# Patient Record
Sex: Male | Born: 1965 | Race: Black or African American | Hispanic: No | Marital: Single | Smoking: Former smoker
Health system: Southern US, Community
[De-identification: ages and names within clinical notes are randomized; demographics above are authoritative.]

## PROBLEM LIST (undated history)

## (undated) DIAGNOSIS — F329 Major depressive disorder, single episode, unspecified: Secondary | ICD-10-CM

## (undated) DIAGNOSIS — F32A Depression, unspecified: Secondary | ICD-10-CM

## (undated) DIAGNOSIS — Z950 Presence of cardiac pacemaker: Secondary | ICD-10-CM

## (undated) DIAGNOSIS — B20 Human immunodeficiency virus [HIV] disease: Secondary | ICD-10-CM

## (undated) DIAGNOSIS — Z21 Asymptomatic human immunodeficiency virus [HIV] infection status: Secondary | ICD-10-CM

## (undated) DIAGNOSIS — I10 Essential (primary) hypertension: Secondary | ICD-10-CM

## (undated) HISTORY — PX: PACEMAKER PLACEMENT: SHX43

## (undated) HISTORY — DX: Depression, unspecified: F32.A

---
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
## 2019-05-02 ENCOUNTER — Emergency Department (HOSPITAL_COMMUNITY)
Admission: EM | Admit: 2019-05-02 | Discharge: 2019-05-02 | Disposition: A | Discharge status: Home / Self Care | Payer: Medicaid Other | Attending: Emergency Medicine | Admitting: Emergency Medicine

## 2019-05-02 ENCOUNTER — Encounter (HOSPITAL_COMMUNITY): Payer: Self-pay | Admitting: Emergency Medicine

## 2019-05-02 ENCOUNTER — Other Ambulatory Visit: Payer: Self-pay

## 2019-05-02 DIAGNOSIS — Z760 Encounter for issue of repeat prescription: Secondary | ICD-10-CM | POA: Diagnosis present

## 2019-05-02 DIAGNOSIS — Z21 Asymptomatic human immunodeficiency virus [HIV] infection status: Secondary | ICD-10-CM | POA: Insufficient documentation

## 2019-05-02 DIAGNOSIS — Z950 Presence of cardiac pacemaker: Secondary | ICD-10-CM | POA: Diagnosis not present

## 2019-05-02 DIAGNOSIS — Z79899 Other long term (current) drug therapy: Secondary | ICD-10-CM | POA: Diagnosis not present

## 2019-05-02 DIAGNOSIS — I10 Essential (primary) hypertension: Secondary | ICD-10-CM | POA: Diagnosis not present

## 2019-05-02 HISTORY — DX: Essential (primary) hypertension: I10

## 2019-05-02 HISTORY — DX: Presence of cardiac pacemaker: Z95.0

## 2019-05-02 HISTORY — DX: Asymptomatic human immunodeficiency virus (hiv) infection status: Z21

## 2019-05-02 HISTORY — DX: Human immunodeficiency virus (HIV) disease: B20

## 2019-05-02 MED ORDER — AZITHROMYCIN 600 MG PO TABS: 600.0000 mg | ORAL_TABLET | ORAL | 0 refills | Status: DC

## 2019-05-02 MED ORDER — SULFAMETHOXAZOLE-TRIMETHOPRIM 800-160 MG PO TABS: 1.0000 | ORAL_TABLET | Freq: Every evening | ORAL | 0 refills | Status: DC

## 2019-05-02 MED ORDER — FOLIC ACID 1 MG PO TABS: 1.0000 mg | ORAL_TABLET | Freq: Every evening | ORAL | 0 refills | Status: DC

## 2019-05-02 MED ORDER — VITAMIN D 25 MCG (1000 UNIT) PO TABS: 1000.0000 [IU] | ORAL_TABLET | Freq: Every day | ORAL | 0 refills | Status: DC

## 2019-05-02 MED ORDER — VALACYCLOVIR HCL 500 MG PO TABS: 500.0000 mg | ORAL_TABLET | Freq: Every evening | ORAL | 0 refills | Status: AC

## 2019-05-02 MED ORDER — GABAPENTIN 600 MG PO TABS: 600.0000 mg | ORAL_TABLET | Freq: Every evening | ORAL | 0 refills | Status: DC

## 2019-05-02 MED ORDER — RISPERIDONE 2 MG PO TABS: 2.0000 mg | ORAL_TABLET | Freq: Every day | ORAL | 0 refills | Status: DC

## 2019-05-02 MED ORDER — AMLODIPINE BESYLATE 10 MG PO TABS: 10.0000 mg | ORAL_TABLET | Freq: Every day | ORAL | 0 refills | Status: DC

## 2019-05-02 MED ORDER — ASPIRIN EC 81 MG PO TBEC: 81.0000 mg | DELAYED_RELEASE_TABLET | Freq: Every evening | ORAL | 0 refills | Status: DC

## 2019-05-02 MED ORDER — VITAMIN D (ERGOCALCIFEROL) 1.25 MG (50000 UNIT) PO CAPS: 50000.0000 [IU] | ORAL_CAPSULE | ORAL | 0 refills | Status: AC

## 2019-05-02 MED ORDER — GENVOYA 150-150-200-10 MG PO TABS: 1.0000 | ORAL_TABLET | Freq: Every evening | ORAL | 0 refills | Status: DC

## 2019-05-02 MED ORDER — METOPROLOL SUCCINATE ER 25 MG PO TB24: 25.0000 mg | ORAL_TABLET | Freq: Every evening | ORAL | 0 refills | Status: DC

## 2019-05-02 MED FILL — GABAPENTIN 600 MG TABLET: 600 | 30 days supply | Qty: 30 | Fill #0

## 2019-05-02 MED FILL — risperiDONE 1 MG TABS: 1 | 30 days supply | Qty: 60 | Fill #0

## 2019-05-02 MED FILL — VIT D2 1.25 MG (50,000 UNIT: 1.25 MG | 5 days supply | Qty: 5 | Fill #0

## 2019-05-02 MED FILL — VITAMIN D3 1,000 UNIT TAB: 25 MCG | 30 days supply | Qty: 30 | Fill #0

## 2019-05-02 MED FILL — METOPROLOL SUCCINATE ER 25: 25 | 30 days supply | Qty: 30 | Fill #0

## 2019-05-02 MED FILL — AMLODIPINE BESYLATE 10 MG T: 10 | 30 days supply | Qty: 30 | Fill #0

## 2019-05-02 MED FILL — VALACYCLOVIR HCL 500 MG TAB: 500 | 30 days supply | Qty: 30 | Fill #0

## 2019-05-02 MED FILL — SULFAMETHOXAZOLE-TMP DS TAB: 800-160 | 30 days supply | Qty: 30 | Fill #0

## 2019-05-02 MED FILL — GENVOYA TABLET: 150-150-200 | 30 days supply | Qty: 30 | Fill #0

## 2019-05-02 MED FILL — FOLIC ACID 1 MG TABS: 1 | 30 days supply | Qty: 30 | Fill #0

## 2019-05-02 MED FILL — AZITHROMYCIN 600 MG TABLET: 600 | 30 days supply | Qty: 5 | Fill #0

## 2019-05-02 MED FILL — ASPIRIN LOW DOSE 81 MG TBEC: 81 | 30 days supply | Qty: 30 | Fill #0

## 2019-05-02 NOTE — Discharge Planning (Signed)
EDCM delivered medications to pt bedside.

## 2019-05-02 NOTE — ED Notes (Signed)
Medications given to patient.  

## 2019-05-02 NOTE — ED Provider Notes (Signed)
[facility] EMERGENCY DEPARTMENT Provider Note   CSN: [id number] Arrival date & time: 05/02/19  [id number]     History   Chief Complaint Chief Complaint  Patient presents with  . Medication Refill    HPI Mike Holland is a 53 y.o. male.     Pt presents to the ED today in need of medications and a pcp.  The pt has a hx of HIV and HTN.  He has recently moved from [place] and is almost out of his meds.  He has about 1 week left.  The pt said he feels fine, just does not want to run out of his meds.  He said he has no insurance and has applied for [employer], it just has not come through yet.     Past Medical History:  Diagnosis Date  . HIV (human immunodeficiency virus infection) ([place])   . Hypertension   . Pacemaker     There are no active problems to display for this patient.   Past Surgical History:  Procedure Laterality Date  . PACEMAKER PLACEMENT          Home Medications    Prior to Admission medications   Medication Sig Start Date End Date Taking? Authorizing Provider  amLODipine (NORVASC) 10 MG tablet Take 1 tablet (10 mg total) by mouth daily. 05/02/19   Isla Pence, MD  aspirin EC 81 MG tablet Take 1 tablet (81 mg total) by mouth every evening. 05/02/19   Isla Pence, MD  azithromycin (ZITHROMAX) 600 MG tablet Take 1 tablet (600 mg total) by mouth every Wednesday. 05/04/19   Isla Pence, MD  cholecalciferol (VITAMIN D3) 25 MCG (1000 UT) tablet Take 1 tablet (1,000 Units total) by mouth daily. 05/02/19   Isla Pence, MD  elvitegravir-cobicistat-emtricitabine-tenofovir (GENVOYA) 150-150-200-10 MG TABS tablet Take 1 tablet by mouth every evening. 05/02/19   Isla Pence, MD  folic acid (FOLVITE) 1 MG tablet Take 1 tablet (1 mg total) by mouth every evening. 05/02/19   Isla Pence, MD  gabapentin (NEURONTIN) 600 MG tablet Take 1 tablet (600 mg total) by mouth every evening. 05/02/19   Isla Pence, MD  metoprolol succinate  (TOPROL-XL) 25 MG 24 hr tablet Take 1 tablet (25 mg total) by mouth every evening. 05/02/19   Isla Pence, MD  risperiDONE (RISPERDAL) 2 MG tablet Take 1 tablet (2 mg total) by mouth at bedtime. 05/02/19   Isla Pence, MD  sulfamethoxazole-trimethoprim (BACTRIM DS) 800-160 MG tablet Take 1 tablet by mouth every evening. 05/02/19   Isla Pence, MD  valACYclovir (VALTREX) 500 MG tablet Take 1 tablet (500 mg total) by mouth every evening. 05/02/19   Isla Pence, MD  Vitamin D, Ergocalciferol, (DRISDOL) 1.25 MG (50000 UT) CAPS capsule Take 1 capsule (50,000 Units total) by mouth every Wednesday. 05/04/19   Isla Pence, MD    Family History No family history on file.  Social History Social History   Tobacco Use  . Smoking status: Not on file  Substance Use Topics  . Alcohol use: Not on file  . Drug use: Not on file     Allergies   Patient has no known allergies.   Review of Systems Review of Systems  All other systems reviewed and are negative.    Physical Exam Updated Vital Signs BP 122/87   Pulse 69   Temp 98.4 F (36.9 C) (Oral)   Resp 12   SpO2 98%   Physical Exam Vitals signs and nursing note reviewed.  Constitutional:      Appearance: Normal appearance.  HENT:     Head: Normocephalic and atraumatic.     Right Ear: External ear normal.     Left Ear: External ear normal.     Nose: Nose normal.     Mouth/Throat:     Mouth: Mucous membranes are moist.  Eyes:     Extraocular Movements: Extraocular movements intact.     Conjunctiva/sclera: Conjunctivae normal.     Pupils: Pupils are equal, round, and reactive to light.  Neck:     Musculoskeletal: Normal range of motion and neck supple.  Cardiovascular:     Rate and Rhythm: Normal rate and regular rhythm.     Pulses: Normal pulses.     Heart sounds: Normal heart sounds.  Pulmonary:     Breath sounds: Wheezing present.  Abdominal:     General: Abdomen is flat. Bowel sounds are normal.      Palpations: Abdomen is soft.  Musculoskeletal:     Comments: Clubbing of fingers  Skin:    General: Skin is warm and dry.     Capillary Refill: Capillary refill takes less than 2 seconds.  Neurological:     General: No focal deficit present.     Mental Status: He is alert and oriented to person, place, and time.  Psychiatric:        Mood and Affect: Mood normal.        Behavior: Behavior normal.        Thought Content: Thought content normal.        Judgment: Judgment normal.      ED Treatments / Results  Labs (all labs ordered are listed, but only abnormal results are displayed) Labs Reviewed - No data to display  EKG None  Radiology No results found.  Procedures Procedures (including critical care time)  Medications Ordered in ED Medications - No data to display   Initial Impression / Assessment and Plan / ED Course  I have reviewed the triage vital signs and the nursing notes.  Pertinent labs & imaging results that were available during my care of the patient were reviewed by me and considered in my medical decision making (see chart for details).        Pt d/w SW who will help pt with meds and to get in with a pcp.  I gave him a month's supply of all his meds.  Final Clinical Impressions(s) / ED Diagnoses   Final diagnoses:  Medication refill    ED Discharge Orders         Ordered    azithromycin (ZITHROMAX) 600 MG tablet  Every Wed     05/02/19 1122    Vitamin D, Ergocalciferol, (DRISDOL) 1.25 MG (50000 UT) CAPS capsule  Every Wed     05/02/19 1122    amLODipine (NORVASC) 10 MG tablet  Daily     05/02/19 1122    aspirin EC 81 MG tablet  Every evening     05/02/19 1122    cholecalciferol (VITAMIN D3) 25 MCG (1000 UT) tablet  Daily     05/02/19 1122    elvitegravir-cobicistat-emtricitabine-tenofovir (GENVOYA) 150-150-200-10 MG TABS tablet  Every evening     [id number]    folic acid (FOLVITE) 1 MG tablet  Every evening     05/02/19 1122     gabapentin (NEURONTIN) 600 MG tablet  Every evening     05/02/19 1122    metoprolol succinate (TOPROL-XL) 25 MG 24 hr tablet  Every evening     05/02/19 1122    risperiDONE (RISPERDAL) 2 MG tablet  Daily at bedtime     05/02/19 1122    sulfamethoxazole-trimethoprim (BACTRIM DS) 800-160 MG tablet  Every evening     05/02/19 1122    valACYclovir (VALTREX) 500 MG tablet  Every evening     05/02/19 1122           Isla Pence, MD 05/02/19 1123

## 2019-05-02 NOTE — ED Notes (Signed)
Pharmacy tech at bedside 

## 2019-05-02 NOTE — ED Notes (Signed)
Spoke with pharmacist in the ED who will message the pharmacy tech to completed med rec.

## 2019-05-02 NOTE — ED Triage Notes (Signed)
Pt states he recently moved here from [place], does not have insurance, just applied for medicaid but needs to see if he can get his meds refilled/referral to pcp, ran out of meds 2 days ago

## 2019-05-02 NOTE — Discharge Planning (Signed)
  [place] Medication Assistance Card Name: Mike Holland  ID (MRN): [id number] [place]: [id number] RX Group: [id number] Discharge Date: 05/02/2019 Expiration Date:05/10/2019                                           (must be filled within 7 days of discharge)     You have been approved to have the prescriptions written by your discharging physician filled through our [facility] (Medication Assistance Through [facility]) program. This program allows for a one-time (no refills) 34-day supply of selected medications for a low copay amount.  The copay is $0.00 per prescription.  Only certain pharmacies are participating in this program with [facility]. You will need to select one of the pharmacies from the attached list and take your prescriptions, this letter, and your photo ID to one of the participating pharmacies.   We are excited that you are able to use the [facility] program to get your medications. These prescriptions must be filled within 7 days of hospital discharge or they will no longer be valid for the [facility] program. Should you have any problems with your prescriptions please contact your case management team member at [phone] for [place]/ [place] you, [place] [facility]

## 2019-05-02 NOTE — Discharge Planning (Signed)
[facility] consulted regarding medication assistance for uninsured pt requiring HIV Rx.  EDCM enrolled pt in [place] program and pt was approved.  Details as follows:  You have been conditionally approved. Your prescribed medication is now available at no cost for 30 days. Based on the information provided, your enrollment will be reviewed by a program specialist and if program eligibility is confirmed, ongoing support will be available while enrolled.  You may receive a call from the Advancing Access program if further information is required.   Please click the preview link below to obtain the pharmacy card information.  You will need to provide this information with the prescription to the pharmacy of your choice to obtain your prescribed medication.   Important next steps Advancing Access will be reaching out to your prescriber for a signed copy of the "Prescriber Certification and Statement of Medical Necessity" contained in the Advancing Access Enrollment Form.  If you have questions regarding this determination, please contact Advancing Access at [phone], Mon-Fri, 9am - 8pm EST, to speak to a program specialist.    BIN#: [id number] PCN: [id number] GROUP: [id number] MEMBER#: [id number]  Advancing Access

## 2019-05-02 NOTE — TOC Transition Note (Signed)
Transition of Care [facility]) - CM/SW Discharge Note   Patient Details  Name: Mike Holland MRN: [id number] Date of Birth: 1965/07/11  Transition of Care [facility]) CM/SW Contact:  Vergie Living, LCSW Phone Number: 05/02/2019, 2:42 PM   Clinical Narrative:   CSW called [facility] at [phone] to connect PT with new PCP.  Appointment was set for December 30 at 2:30 pm.  Appointment was logged to clinet care summary           Patient Goals and CMS Choice        Discharge Placement                       Discharge Plan and Services                                     Social Determinants of Health (SDOH) Interventions     Readmission Risk Interventions No flowsheet data found.

## 2019-05-25 ENCOUNTER — Encounter (INDEPENDENT_AMBULATORY_CARE_PROVIDER_SITE_OTHER): Payer: Self-pay | Admitting: Primary Care

## 2019-05-25 ENCOUNTER — Ambulatory Visit (INDEPENDENT_AMBULATORY_CARE_PROVIDER_SITE_OTHER): Payer: Self-pay | Admitting: Primary Care

## 2019-05-25 ENCOUNTER — Other Ambulatory Visit: Payer: Self-pay

## 2019-05-25 DIAGNOSIS — Z7689 Persons encountering health services in other specified circumstances: Secondary | ICD-10-CM

## 2019-05-25 DIAGNOSIS — Z09 Encounter for follow-up examination after completed treatment for conditions other than malignant neoplasm: Secondary | ICD-10-CM

## 2019-05-25 DIAGNOSIS — B20 Human immunodeficiency virus [HIV] disease: Secondary | ICD-10-CM

## 2019-05-25 DIAGNOSIS — I10 Essential (primary) hypertension: Secondary | ICD-10-CM

## 2019-05-25 NOTE — Progress Notes (Signed)
chl vi Virtual Visit via Telephone Note  I connected with Mike Holland on 05/25/19 at  2:30 PM EST by telephone and verified that I am speaking with the correct person using two identifiers.   I discussed the limitations, risks, security and privacy concerns of performing an evaluation and management service by telephone and the availability of in person appointments. I also discussed with the patient that there may be a patient responsible charge related to this service. The patient expressed understanding and agreed to proceed.   History of Present Illness: Mike Holland presents for establishment of care and hospital follow up. He has recently moved from [facility] and has a medical history of HIV and emergency room filled medications for 30 days. Called ID spoke with Sharyn Lull informed her of patient situation and will be out medication the first week in January the office will reach out to him and follow up with him for medication refill and new patient appointment. Patient denies any other problems or concerns. Past Medical History:  Diagnosis Date  . HIV (human immunodeficiency virus infection) ([place])   . Hypertension   . Pacemaker    Observations/Objective: Review of Systems  All other systems reviewed and are negative.  Assessment and Plan: Mike Holland was seen today for new patient (initial visit).  Diagnoses and all orders for this visit:  Encounter to establish care [name], NP-C will be your  (PCP) mastered prepared that is able to that will  diagnosed and treatment able to answer health concern as well as continuing care of varied medical conditions, not limited by cause, organ system, or diagnosis.   HIV disease ([place]) See above HPI -     Ambulatory referral to Infectious Disease  Essential hypertension Controlled on amlodipine 10 mg daily. Goal met of ,/= 130/80 following a low sodium diet and adhering to medication.  Hospital discharge follow-up   Presents to the ED on 05/02/2019 for medications and a pcp. His main concern was he did not want to run out of his meds. Appointment schedule with [facility] to establish care  Follow Up Instructions:    I discussed the assessment and treatment plan with the patient. The patient was provided an opportunity to ask questions and all were answered. The patient agreed with the plan and demonstrated an understanding of the instructions.   The patient was advised to call back or seek an in-person evaluation if the symptoms worsen or if the condition fails to improve as anticipated.  I provided 22  minutes of non-face-to-face time during this encounter. Includes contacting Infectious disease   Kerin Perna, NP

## 2019-05-25 NOTE — Progress Notes (Signed)
Pt needs to be set up with infectious disease

## 2019-05-26 ENCOUNTER — Other Ambulatory Visit (INDEPENDENT_AMBULATORY_CARE_PROVIDER_SITE_OTHER): Payer: Self-pay | Admitting: Primary Care

## 2019-05-26 ENCOUNTER — Other Ambulatory Visit (INDEPENDENT_AMBULATORY_CARE_PROVIDER_SITE_OTHER): Payer: Self-pay

## 2019-05-26 DIAGNOSIS — Z79899 Other long term (current) drug therapy: Secondary | ICD-10-CM

## 2019-05-27 LAB — CMP14+EGFR
ALT: 11 IU/L (ref 0–44)
AST: 18 IU/L (ref 0–40)
Albumin/Globulin Ratio: 0.7 — ABNORMAL LOW (ref 1.2–2.2)
Albumin: 3.9 g/dL (ref 3.8–4.9)
Alkaline Phosphatase: 155 IU/L — ABNORMAL HIGH (ref 39–117)
BUN/Creatinine Ratio: 11 (ref 9–20)
BUN: 11 mg/dL (ref 6–24)
Bilirubin Total: 0.5 mg/dL (ref 0.0–1.2)
CO2: 19 mmol/L — ABNORMAL LOW (ref 20–29)
Calcium: 9.3 mg/dL (ref 8.7–10.2)
Chloride: 100 mmol/L (ref 96–106)
Creatinine, Ser: 1.02 mg/dL (ref 0.76–1.27)
GFR calc Af Amer: 97 mL/min/{1.73_m2} (ref >59)
GFR calc non Af Amer: 84 mL/min/{1.73_m2} (ref >59)
Globulin, Total: 5.4 g/dL — ABNORMAL HIGH (ref 1.5–4.5)
Glucose: 98 mg/dL (ref 65–99)
Potassium: 4 mmol/L (ref 3.5–5.2)
Sodium: 134 mmol/L (ref 134–144)
Total Protein: 9.3 g/dL — ABNORMAL HIGH (ref 6.0–8.5)

## 2019-05-27 LAB — LIPID PANEL
Chol/HDL Ratio: 4.4 ratio (ref 0.0–5.0)
Cholesterol, Total: 177 mg/dL (ref 100–199)
HDL: 40 mg/dL (ref >39)
LDL Chol Calc (NIH): 120 mg/dL — ABNORMAL HIGH (ref 0–99)
Triglycerides: 91 mg/dL (ref 0–149)
VLDL Cholesterol Cal: 17 mg/dL (ref 5–40)

## 2019-05-27 LAB — CBC WITH DIFFERENTIAL/PLATELET
Basophils Absolute: 0 10*3/uL (ref 0.0–0.2)
Basos: 1 %
EOS (ABSOLUTE): 0.4 10*3/uL (ref 0.0–0.4)
Eos: 7 %
Hematocrit: 41.2 % (ref 37.5–51.0)
Hemoglobin: 14.5 g/dL (ref 13.0–17.7)
Immature Grans (Abs): 0 10*3/uL (ref 0.0–0.1)
Immature Granulocytes: 0 %
Lymphocytes Absolute: 1.8 10*3/uL (ref 0.7–3.1)
Lymphs: 35 %
MCH: 33.3 pg — ABNORMAL HIGH (ref 26.6–33.0)
MCHC: 35.2 g/dL (ref 31.5–35.7)
MCV: 95 fL (ref 79–97)
Monocytes Absolute: 0.5 10*3/uL (ref 0.1–0.9)
Monocytes: 9 %
Neutrophils Absolute: 2.5 10*3/uL (ref 1.4–7.0)
Neutrophils: 48 %
Platelets: 17 10*3/uL — CL (ref 150–450)
RBC: 4.36 x10E6/uL (ref 4.14–5.80)
RDW: 13.1 % (ref 11.6–15.4)
WBC: 5.2 10*3/uL (ref 3.4–10.8)

## 2019-05-30 ENCOUNTER — Other Ambulatory Visit (INDEPENDENT_AMBULATORY_CARE_PROVIDER_SITE_OTHER): Payer: Self-pay | Admitting: Primary Care

## 2019-05-30 ENCOUNTER — Other Ambulatory Visit: Payer: Self-pay | Admitting: Hematology & Oncology

## 2019-05-30 NOTE — Progress Notes (Signed)
Lab note platelets 17 paged Dr. Marin Olp for advice reviewed all labs and medications with me. Patient is HIV positive which may cause thrombocytopenia ( correction from previous note stating thrombosis .) Dr. Marin Olp was given patient name and MRN and will have him seen in the office this week. Called to inform patient abnormal blood work and would be following up with a hematologist.

## 2019-06-01 ENCOUNTER — Telehealth: Payer: Self-pay | Admitting: Hematology and Oncology

## 2019-06-01 NOTE — Telephone Encounter (Signed)
A new hem appt has been scheduled for Mike Holland to see Dr. Lorenso Courier on 1/8 at 1pm. Pt aware to arrive 15 minutes early.

## 2019-06-03 ENCOUNTER — Other Ambulatory Visit: Payer: Self-pay

## 2019-06-03 ENCOUNTER — Encounter: Payer: Self-pay | Admitting: Hematology and Oncology

## 2019-06-09 ENCOUNTER — Other Ambulatory Visit: Payer: Self-pay | Admitting: *Deleted

## 2019-06-09 DIAGNOSIS — Z113 Encounter for screening for infections with a predominantly sexual mode of transmission: Secondary | ICD-10-CM

## 2019-06-09 DIAGNOSIS — Z79899 Other long term (current) drug therapy: Secondary | ICD-10-CM

## 2019-06-09 DIAGNOSIS — B20 Human immunodeficiency virus [HIV] disease: Secondary | ICD-10-CM

## 2019-06-10 ENCOUNTER — Encounter: Payer: Self-pay | Admitting: Family

## 2019-06-10 ENCOUNTER — Other Ambulatory Visit: Payer: Self-pay

## 2019-06-10 ENCOUNTER — Telehealth: Payer: Self-pay | Admitting: Pharmacy Technician

## 2019-06-10 ENCOUNTER — Ambulatory Visit: Payer: Self-pay

## 2019-06-10 ENCOUNTER — Telehealth: Payer: Self-pay | Admitting: *Deleted

## 2019-06-10 DIAGNOSIS — B20 Human immunodeficiency virus [HIV] disease: Secondary | ICD-10-CM

## 2019-06-10 DIAGNOSIS — Z113 Encounter for screening for infections with a predominantly sexual mode of transmission: Secondary | ICD-10-CM

## 2019-06-10 DIAGNOSIS — Z79899 Other long term (current) drug therapy: Secondary | ICD-10-CM

## 2019-06-10 LAB — T-HELPER CELL (CD4) - (RCID CLINIC ONLY)
CD4 % Helper T Cell: 10 % — ABNORMAL LOW (ref 33–65)
CD4 T Cell Abs: 175 /uL — ABNORMAL LOW (ref 400–1790)

## 2019-06-10 NOTE — Telephone Encounter (Signed)
Patient here for intake labs and financial assessment. He was given a 30 day supply of Genvoya at the emergency room 05/02/19, has been out for 2 days. He signed up for RW/ADAP today, application will be sent per Timmothy Sours.  He met with Inez Catalina today for emergency 30 day supply of Genvoya, but he has already used this once-per-lifetime program.  He will need the full application sent in per [facility], should hear approval/denial back next week.   Clinic unable to provide medication today. Patient given print out of his follow up appointments 1/29 and given phone number to contact the hematology appointment he missed 1/8 ([phone]). Patient would benefit from case management. Will connect to [facility], as they are on call on Fridays. Meagan will come 12/29 9:00 - 9:30 for intake.  Landis Gandy, RN

## 2019-06-10 NOTE — Telephone Encounter (Signed)
[facility] Patient Advocate Encounter  Completed and sent [employer] Advancing Access application for Genvoya for this patient who is uninsured.    Status is pending.  We will continue to follow.  Venida Jarvis. Nadara Mustard [place] Patient [facility] for Infectious Disease Phone: [phone] Fax:  [phone]

## 2019-06-13 ENCOUNTER — Telehealth: Payer: Self-pay | Admitting: Hematology and Oncology

## 2019-06-13 ENCOUNTER — Other Ambulatory Visit: Payer: Self-pay | Admitting: Infectious Diseases

## 2019-06-13 ENCOUNTER — Telehealth: Payer: Self-pay | Admitting: Pharmacy Technician

## 2019-06-13 DIAGNOSIS — B20 Human immunodeficiency virus [HIV] disease: Secondary | ICD-10-CM

## 2019-06-13 LAB — URINE CYTOLOGY ANCILLARY ONLY
Chlamydia: NEGATIVE
Comment: NEGATIVE
Comment: NORMAL
Neisseria Gonorrhea: NEGATIVE

## 2019-06-13 NOTE — Telephone Encounter (Signed)
Returned patient's phone call regarding rescheduling 01/08 cancelled appointment, per patient's request appointment has moved to 01/27.

## 2019-06-13 NOTE — Addendum Note (Signed)
Addended by: Dolan Amen D on: 06/13/2019 05:09 PM   Modules accepted: Orders

## 2019-06-13 NOTE — Telephone Encounter (Signed)
[facility] Patient Advocate Encounter  Completed and sent [employer] Advancing Access application for Genvoya for this patient who is uninsured.    Patient is approved 06/13/2019 through 06/12/2020.  BIN      [id number] PCN    [id number] GRP    [id number] ID        [id number]  This assistance will also work with other [employer] HIV medications.   Venida Jarvis. Nadara Mustard [place] Patient [facility] for Infectious Disease Phone: [phone] Fax:  [phone]

## 2019-06-13 NOTE — Addendum Note (Signed)
Addended by: Dolan Amen D on: 06/13/2019 04:18 PM   Modules accepted: Orders

## 2019-06-17 ENCOUNTER — Telehealth: Payer: Self-pay

## 2019-06-17 NOTE — Telephone Encounter (Signed)
Patient called pharmacy staff requesting refills on medication.  Advised patient will need to wait for refills until he mets with FNP before refills can be sent. Patient okay with this.  [place]

## 2019-06-21 NOTE — Progress Notes (Signed)
[place] [phone]   [phone]  INITIAL CONSULT NOTE  Patient Care Team: Patient, No Pcp Per as PCP - General (General Practice)  Hematological/Oncological History #Thrombocytopenia 1) 05/26/2019: WBC 5.2, Hgb 14.5, Plt 17. Patient established care after moving from [facility]. 2) 06/10/2019: WBC 4.5, Hgb 15.9, Plt 44 3) 06/22/2019: establish care with Dr. Lorenso Courier   CHIEF COMPLAINTS/PURPOSE OF CONSULTATION:  Thrombocytopenia  HISTORY OF PRESENTING ILLNESS:  EDWARD GUTHMILLER 54 y.o. male with medical history significant for HIV and HTN who presents for evaluation of thrombocytopenia.  Of note the patient is from [place] and recently transferred here to [facility].  At this time we do not have any of his prior outside records and are most recent CBCs and records date only back to December 2020.  On review of the previous records Mr. Jake Michaelis he presented to the emergency department on 05/02/2019 to receive refills on his HIV medications.  He reports that he was previously followed in [place] and required these medications provided locally.  He was reported to be asymptomatic at the time.  On 05/25/2019 the patient established with [name] as a primary care provider.  At that time she placed a consult to infectious disease for assistance in management of his HIV.  Baseline labs collected on him at that time revealed thrombocytopenia with platelets of 17.  On 06/10/2019 the patient underwent repeat laboratory studies including full serologies of hepatitis and HIV, CD4 count, and repeat CBC.  At that time he was noted to have a platelet count of 44.  The patient was referred to hematology for evaluation of his thrombocytopenia.  On exam today Mr. Hobin noted that he moved from [place] after living there for 40 years.  He has moved to this area because his niece is in [place].  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  On discussion with Mr. Tacy Learn he reports that he has been told he had a low platelet count before.  He reports that he "got shots in the back" and his platelets increased approximately 3 times.  He notes that this occurred at [facility] in [place].  He reports that his physician looking after his HIV was Dr. Laurette Schimke a private physician who is not associated with a particular hospital.  On further discussion Mr. Raynald Blend he notes that he has been on [place] for 2 to 3 years.  He reports that he did run out after moving to [place] and did not have his medication for approximately 2 to 3 weeks.  He notes that he is also currently out of his medication for the last 1 week, but has an appointment with infectious disease on Friday.  Mr. Covert reports that he has never had any issues with his thrombocytopenia.  He reports that he has no bruising, bleeding, nosebleeds, or dark stools.  He has never had issues with a major bleed.  On further discussion he denies having any other concerning symptoms at this time.  A full 10 point ROS is listed below.  MEDICAL HISTORY:  Past Medical History:  Diagnosis Date  . HIV (human immunodeficiency virus infection) ([place])   . Hypertension   . Pacemaker     SURGICAL HISTORY: Past Surgical History:  Procedure Laterality Date  . PACEMAKER PLACEMENT      SOCIAL HISTORY: Social History   Socioeconomic History  . Marital status: Single  Spouse name: Not on file  . Number of children: Not on file  . Years of education: Not on file  . Highest education level: Not on file  Occupational History  . Not on file  Tobacco Use  . Smoking status: Current Every Day Smoker    Types: Cigars  . Smokeless tobacco: Never Used  . Tobacco comment: 1-2 black and milds per day   Substance and Sexual Activity  . Alcohol use: Yes    Comment: holidays   . Drug use: Never  . Sexual activity: Yes    Partners: Female  Other Topics Concern   . Not on file  Social History Narrative  . Not on file   Social Determinants of Health   Financial Resource Strain:   . Difficulty of Paying Living Expenses: Not on file  Food Insecurity:   . Worried About Charity fundraiser in the Last Year: Not on file  . Ran Out of Food in the Last Year: Not on file  Transportation Needs:   . Lack of Transportation (Medical): Not on file  . Lack of Transportation (Non-Medical): Not on file  Physical Activity:   . Days of Exercise per Week: Not on file  . Minutes of Exercise per Session: Not on file  Stress:   . Feeling of Stress : Not on file  Social Connections:   . Frequency of Communication with Friends and Family: Not on file  . Frequency of Social Gatherings with Friends and Family: Not on file  . Attends Religious Services: Not on file  . Active Member of Clubs or Organizations: Not on file  . Attends Archivist Meetings: Not on file  . Marital Status: Not on file  Intimate Partner Violence:   . Fear of Current or Ex-Partner: Not on file  . Emotionally Abused: Not on file  . Physically Abused: Not on file  . Sexually Abused: Not on file    FAMILY HISTORY: Family History  Problem Relation Age of Onset  . Heart disease Mother   . Heart disease Sister     ALLERGIES:  has No Known Allergies.  MEDICATIONS:  Current Outpatient Medications  Medication Sig Dispense Refill  . amLODipine (NORVASC) 10 MG tablet Take 1 tablet (10 mg total) by mouth daily. 30 tablet 0  . aspirin EC 81 MG tablet Take 1 tablet (81 mg total) by mouth every evening. 30 tablet 0  . azithromycin (ZITHROMAX) 600 MG tablet Take 1 tablet (600 mg total) by mouth every Wednesday. 5 tablet 0  . cholecalciferol (VITAMIN D3) 25 MCG (1000 UT) tablet Take 1 tablet (1,000 Units total) by mouth daily. 30 tablet 0  . elvitegravir-cobicistat-emtricitabine-tenofovir (GENVOYA) 150-150-200-10 MG TABS tablet Take 1 tablet by mouth every evening. 30 tablet 0  . folic  acid (FOLVITE) 1 MG tablet Take 1 tablet (1 mg total) by mouth every evening. 30 tablet 0  . metoprolol succinate (TOPROL-XL) 25 MG 24 hr tablet Take 1 tablet (25 mg total) by mouth every evening. 30 tablet 0  . risperiDONE (RISPERDAL) 2 MG tablet Take 1 tablet (2 mg total) by mouth at bedtime. 30 tablet 0  . sulfamethoxazole-trimethoprim (BACTRIM DS) 800-160 MG tablet Take 1 tablet by mouth every evening. 30 tablet 0  . valACYclovir (VALTREX) 500 MG tablet Take 1 tablet (500 mg total) by mouth every evening. 30 tablet 0  . Vitamin D, Ergocalciferol, (DRISDOL) 1.25 MG (50000 UT) CAPS capsule Take 1 capsule (50,000 Units total) by mouth  every Wednesday. 5 capsule 0   No current facility-administered medications for this visit.    REVIEW OF SYSTEMS:   Constitutional: ( - ) fevers, ( - )  chills , ( - ) night sweats Eyes: ( - ) blurriness of vision, ( - ) double vision, ( - ) watery eyes Ears, nose, mouth, throat, and face: ( - ) mucositis, ( - ) sore throat Respiratory: ( - ) cough, ( - ) dyspnea, ( - ) wheezes Cardiovascular: ( - ) palpitation, ( - ) chest discomfort, ( - ) lower extremity swelling Gastrointestinal:  ( - ) nausea, ( - ) heartburn, ( - ) change in bowel habits Skin: ( - ) abnormal skin rashes Lymphatics: ( - ) new lymphadenopathy, ( - ) easy bruising Neurological: ( - ) numbness, ( - ) tingling, ( - ) new weaknesses Behavioral/Psych: ( - ) mood change, ( - ) new changes  All other systems were reviewed with the patient and are negative.  PHYSICAL EXAMINATION: ECOG PERFORMANCE STATUS: 0 - Asymptomatic  Vitals:   06/22/19 0931  BP: 128/83  Pulse: (!) 105  Resp: 18  Temp: 98.4 F (36.9 C)  SpO2: 97%   Filed Weights   06/22/19 0931  Weight: 141 lb 4.8 oz (64.1 kg)    GENERAL: well appearing middle aged [place] American male in NAD  SKIN: skin color, texture, turgor are normal, no rashes or significant lesions EYES: conjunctiva are pink and non-injected, sclera  clear LUNGS: clear to auscultation and percussion with normal breathing effort HEART: regular rate & rhythm and no murmurs and no lower extremity edema Musculoskeletal: no cyanosis of digits. Prominent clubbing on fingers bilaterally.  PSYCH: alert & oriented x 3, fluent speech NEURO: no focal motor/sensory deficits  LABORATORY DATA:  I have reviewed the data as listed CBC Latest Ref Rng & Units 06/22/2019 06/10/2019 05/26/2019  WBC 4.0 - 10.5 K/uL 3.2(L) 4.5 5.2  Hemoglobin 13.0 - 17.0 g/dL 14.6 15.9 14.5  Hematocrit 39.0 - 52.0 % 41.3 45.8 41.2  Platelets 150 - 400 K/uL 131(L) 44(L) 17(LL)    CMP Latest Ref Rng & Units 06/22/2019 06/10/2019 05/26/2019  Glucose 70 - 99 mg/dL 98 97 98  BUN 6 - 20 mg/dL '11 12 11  ' Creatinine 0.61 - 1.24 mg/dL 0.90 0.92 1.02  Sodium 135 - 145 mmol/L 139 135 134  Potassium 3.5 - 5.1 mmol/L 3.7 3.9 4.0  Chloride 98 - 111 mmol/L 105 104 100  CO2 22 - 32 mmol/L 26 27 19(L)  Calcium 8.9 - 10.3 mg/dL 9.2 9.3 9.3  Total Protein 6.5 - 8.1 g/dL 10.1(H) 9.6(H) 9.3(H)  Total Bilirubin 0.3 - 1.2 mg/dL 0.4 0.4 0.5  Alkaline Phos 38 - 126 U/L 117 - 155(H)  AST 15 - 41 U/L '22 18 18  ' ALT 0 - 44 U/L '13 12 11    ' PATHOLOGY: None to review.    BLOOD FILM:  Review of the peripheral blood smear showed normal appearing white cells with neutrophils that were appropriately lobated and granulated. There was no predominance of bi-lobed or hyper-segmented neutrophils appreciated. No Dohle bodies were noted. There was no left shifting, immature forms or blasts noted. Lymphocytes remain normal in size without any predominance of large granular lymphocytes. Red cells show no anisopoikilocytosis, macrocytes , microcytes or polychromasia. There were no schistocytes, target cells, echinocytes, acanthocytes, dacrocytes, or stomatocytes.There was no rouleaux formation, nucleated red cells, or intra-cellular inclusions noted. The platelets are normal in size, shape, and  color without any  clumping evident.  RADIOGRAPHIC STUDIES: None relevant to review.   No results found.  ASSESSMENT & PLAN FRANCISCO OSTROVSKY 54 y.o. male with medical history significant for HIV and HTN who presents for evaluation of thrombocytopenia.  For this case of thrombocytopenia there are numerous possible etiologies.  Always first and foremost we would like to rule out pseudothrombocytopenia by reviewing a peripheral blood film and assuring that there is no clumping.  Additionally we can order a citrated platelet count to assure that the numbers are being read appropriately.  Furthermore we should consider that the patient has poor liver function and can assess that with hepatitis B and C serologies as well as PT/INR, albumin and a panel liver functions.  This can be further assessed with a splenic ultrasound to assure that there is no splenomegaly.  Of note poorly controlled HIV alone can be an etiology of thrombocytopenia.  Typically with appropriate antiretroviral therapy this thrombocytopenia resolves.  Strongly agree with ambulatory referral to infectious diseases for measurement of CD4 count and determination of the status of his HIV.  Of note ITP is not uncommon within the HIV population and would be treated similarly with steroids and IVIG the patient's platelet count were to drop less than 30.  Fortunately at this time the patient's platelet count is seem to be greater than 40.  I have a low suspicion of a primary bone marrow disorder given the normal white blood cell count and hemoglobin, however if abnormalities were to develop we could consider bone marrow biopsy.  At this time I would recommend the patient return in approximately 3 months with an interval lab check.  #Thrombocytopenia, chronic. Improving --today will order CBC, citrated platelets, and peripheral blood film --will assess liver function with CMP. Of note the patient has Positive surface and core antibody for Hep B with negative surface  antigen, which would imply prior infection. Can consider PT/INR to assess synthetic liver function --recommend splenic US to evaluate for splenomegaly --will request records from his physician in [place] to determine the chronicity of his thrombocytopenia.  --agree with referral to ID. If HIV is not well controlled it alone can cause thrombocytopenia, strong suspicion this is the cause. Currently his CD4 count is noted to be <200.  --RTC in 3 months to reassess platelet count.   Orders Placed This Encounter  Procedures  . US Abdomen Complete    Standing Status:   Future    Standing Expiration Date:   06/21/2020    Order Specific Question:   Reason for Exam (SYMPTOM  OR DIAGNOSIS REQUIRED)    Answer:   Thrombocytopenia, assess for liver abnormality/spleen.    Order Specific Question:   Preferred imaging location?    Answer:   [facility]  . CBC with Differential ([employer] Only)    Standing Status:   Future    Number of Occurrences:   1    Standing Expiration Date:   06/21/2020  . Immature Platelet Fraction    Standing Status:   Future    Number of Occurrences:   1    Standing Expiration Date:   06/21/2020  . Save Smear (SSMR)    Standing Status:   Future    Number of Occurrences:   1    Standing Expiration Date:   06/21/2020  . CMP ([place] only)    Standing Status:   Future    Number of Occurrences:   1  Standing Expiration Date:   06/21/2020  . Lactate dehydrogenase (LDH)    Standing Status:   Future    Number of Occurrences:   1    Standing Expiration Date:   06/21/2020  . TSH    Standing Status:   Future    Number of Occurrences:   1    Standing Expiration Date:   06/21/2020  . Platelet by Citrate    Standing Status:   Future    Number of Occurrences:   1    Standing Expiration Date:   06/21/2020  . Vitamin B12    Standing Status:   Future    Number of Occurrences:   1    Standing Expiration Date:   06/21/2020  . Folate, Serum    Standing Status:   Future     Number of Occurrences:   1    Standing Expiration Date:   06/21/2020  . Methylmalonic acid, serum    Standing Status:   Future    Number of Occurrences:   1    Standing Expiration Date:   06/21/2020  . Homocysteine, serum    Standing Status:   Future    Number of Occurrences:   1    Standing Expiration Date:   06/21/2020    All questions were answered. The patient knows to call the clinic with any problems, questions or concerns.  A total of more than 60 minutes were spent on this encounter and over half of that time was spent on counseling and coordination of care as outlined above.   Ledell Peoples, MD Department of Hematology/Oncology [place] at [facility] Phone: [phone] Pager: [phone] Email: [email or web address]  06/22/2019 10:58 AM

## 2019-06-22 ENCOUNTER — Encounter: Payer: Self-pay | Admitting: Hematology and Oncology

## 2019-06-22 ENCOUNTER — Other Ambulatory Visit: Payer: Self-pay

## 2019-06-22 ENCOUNTER — Inpatient Hospital Stay: Payer: Medicaid Other | Attending: Hematology and Oncology | Admitting: Hematology and Oncology

## 2019-06-22 ENCOUNTER — Inpatient Hospital Stay: Payer: Medicaid Other

## 2019-06-22 VITALS — BP 128/83 | HR 105 | Temp 98.4°F | Resp 18 | Ht 70.0 in | Wt 141.3 lb

## 2019-06-22 DIAGNOSIS — Z21 Asymptomatic human immunodeficiency virus [HIV] infection status: Secondary | ICD-10-CM | POA: Diagnosis not present

## 2019-06-22 DIAGNOSIS — D693 Immune thrombocytopenic purpura: Secondary | ICD-10-CM | POA: Insufficient documentation

## 2019-06-22 DIAGNOSIS — Z79899 Other long term (current) drug therapy: Secondary | ICD-10-CM | POA: Insufficient documentation

## 2019-06-22 DIAGNOSIS — D696 Thrombocytopenia, unspecified: Secondary | ICD-10-CM

## 2019-06-22 LAB — CMP (CANCER CENTER ONLY)
ALT: 13 U/L (ref 0–44)
AST: 22 U/L (ref 15–41)
Albumin: 4 g/dL (ref 3.5–5.0)
Alkaline Phosphatase: 117 U/L (ref 38–126)
Anion gap: 8 (ref 5–15)
BUN: 11 mg/dL (ref 6–20)
CO2: 26 mmol/L (ref 22–32)
Calcium: 9.2 mg/dL (ref 8.9–10.3)
Chloride: 105 mmol/L (ref 98–111)
Creatinine: 0.9 mg/dL (ref 0.61–1.24)
GFR, Est AFR Am: >60 mL/min (ref >60)
GFR, Estimated: >60 mL/min (ref >60)
Glucose, Bld: 98 mg/dL (ref 70–99)
Potassium: 3.7 mmol/L (ref 3.5–5.1)
Sodium: 139 mmol/L (ref 135–145)
Total Bilirubin: 0.4 mg/dL (ref 0.3–1.2)
Total Protein: 10.1 g/dL — ABNORMAL HIGH (ref 6.5–8.1)

## 2019-06-22 LAB — QUANTIFERON-TB GOLD PLUS
Mitogen-NIL: >10 IU/mL
NIL: 0.07 IU/mL
QuantiFERON-TB Gold Plus: NEGATIVE
TB1-NIL: <0 IU/mL
TB2-NIL: <0 IU/mL

## 2019-06-22 LAB — TSH: TSH: 1.564 u[IU]/mL (ref 0.320–4.118)

## 2019-06-22 LAB — CBC WITH DIFFERENTIAL/PLATELET
Absolute Monocytes: 369 cells/uL (ref 200–950)
Basophils Absolute: 59 cells/uL (ref 0–200)
Basophils Relative: 1.3 %
Eosinophils Absolute: 270 cells/uL (ref 15–500)
Eosinophils Relative: 6 %
HCT: 45.8 % (ref 38.5–50.0)
Hemoglobin: 15.9 g/dL (ref 13.2–17.1)
Lymphs Abs: 1980 cells/uL (ref 850–3900)
MCH: 33.8 pg — ABNORMAL HIGH (ref 27.0–33.0)
MCHC: 34.7 g/dL (ref 32.0–36.0)
MCV: 97.4 fL (ref 80.0–100.0)
MPV: 12.2 fL (ref 7.5–12.5)
Monocytes Relative: 8.2 %
Neutro Abs: 1823 cells/uL (ref 1500–7800)
Neutrophils Relative %: 40.5 %
Platelets: 44 10*3/uL — ABNORMAL LOW (ref 140–400)
RBC: 4.7 10*6/uL (ref 4.20–5.80)
RDW: 13.9 % (ref 11.0–15.0)
Total Lymphocyte: 44 %
WBC: 4.5 10*3/uL (ref 3.8–10.8)

## 2019-06-22 LAB — HEPATITIS C ANTIBODY
Hepatitis C Ab: NONREACTIVE
SIGNAL TO CUT-OFF: 0.35 (ref <1.00)

## 2019-06-22 LAB — CBC WITH DIFFERENTIAL (CANCER CENTER ONLY)
Abs Immature Granulocytes: 0.01 10*3/uL (ref 0.00–0.07)
Basophils Absolute: 0 10*3/uL (ref 0.0–0.1)
Basophils Relative: 1 %
Eosinophils Absolute: 0.2 10*3/uL (ref 0.0–0.5)
Eosinophils Relative: 6 %
HCT: 41.3 % (ref 39.0–52.0)
Hemoglobin: 14.6 g/dL (ref 13.0–17.0)
Immature Granulocytes: 0 %
Lymphocytes Relative: 39 %
Lymphs Abs: 1.2 10*3/uL (ref 0.7–4.0)
MCH: 34 pg (ref 26.0–34.0)
MCHC: 35.4 g/dL (ref 30.0–36.0)
MCV: 96.3 fL (ref 80.0–100.0)
Monocytes Absolute: 0.3 10*3/uL (ref 0.1–1.0)
Monocytes Relative: 8 %
Neutro Abs: 1.5 10*3/uL — ABNORMAL LOW (ref 1.7–7.7)
Neutrophils Relative %: 46 %
Platelet Count: 131 10*3/uL — ABNORMAL LOW (ref 150–400)
RBC: 4.29 MIL/uL (ref 4.22–5.81)
RDW: 13.4 % (ref 11.5–15.5)
WBC Count: 3.2 10*3/uL — ABNORMAL LOW (ref 4.0–10.5)
nRBC: 0 % (ref 0.0–0.2)

## 2019-06-22 LAB — LACTATE DEHYDROGENASE: LDH: 234 U/L — ABNORMAL HIGH (ref 98–192)

## 2019-06-22 LAB — COMPLETE METABOLIC PANEL WITH GFR
AG Ratio: 0.7 (calc) — ABNORMAL LOW (ref 1.0–2.5)
ALT: 12 U/L (ref 9–46)
AST: 18 U/L (ref 10–35)
Albumin: 4 g/dL (ref 3.6–5.1)
Alkaline phosphatase (APISO): 112 U/L (ref 35–144)
BUN: 12 mg/dL (ref 7–25)
CO2: 27 mmol/L (ref 20–32)
Calcium: 9.3 mg/dL (ref 8.6–10.3)
Chloride: 104 mmol/L (ref 98–110)
Creat: 0.92 mg/dL (ref 0.70–1.33)
GFR, Est African American: 110 mL/min/{1.73_m2} (ref >=60)
GFR, Est Non African American: 95 mL/min/{1.73_m2} (ref >=60)
Globulin: 5.6 g/dL (calc) — ABNORMAL HIGH (ref 1.9–3.7)
Glucose, Bld: 97 mg/dL (ref 65–99)
Potassium: 3.9 mmol/L (ref 3.5–5.3)
Sodium: 135 mmol/L (ref 135–146)
Total Bilirubin: 0.4 mg/dL (ref 0.2–1.2)
Total Protein: 9.6 g/dL — ABNORMAL HIGH (ref 6.1–8.1)

## 2019-06-22 LAB — LIPID PANEL
Cholesterol: 172 mg/dL (ref <200)
HDL: 32 mg/dL — ABNORMAL LOW (ref >=40)
LDL Cholesterol (Calc): 115 mg/dL (calc) — ABNORMAL HIGH
Non-HDL Cholesterol (Calc): 140 mg/dL (calc) — ABNORMAL HIGH (ref <130)
Total CHOL/HDL Ratio: 5.4 (calc) — ABNORMAL HIGH (ref <5.0)
Triglycerides: 133 mg/dL (ref <150)

## 2019-06-22 LAB — HIV-1/2 AB - DIFFERENTIATION
HIV-1 antibody: POSITIVE — AB
HIV-2 Ab: NEGATIVE

## 2019-06-22 LAB — HEPATITIS B SURFACE ANTIBODY,QUALITATIVE: Hep B S Ab: REACTIVE — AB

## 2019-06-22 LAB — HEPATITIS B CORE ANTIBODY, TOTAL: Hep B Core Total Ab: REACTIVE — AB

## 2019-06-22 LAB — RPR: RPR Ser Ql: NONREACTIVE

## 2019-06-22 LAB — SAVE SMEAR(SSMR), FOR PROVIDER SLIDE REVIEW

## 2019-06-22 LAB — HIV ANTIBODY (ROUTINE TESTING W REFLEX): HIV 1&2 Ab, 4th Generation: REACTIVE — AB

## 2019-06-22 LAB — HIV-1 GENOTYPE: HIV-1 Genotype: DETECTED — AB

## 2019-06-22 LAB — HLA B*5701: HLA-B*5701 w/rflx HLA-B High: NEGATIVE

## 2019-06-22 LAB — HIV-1 RNA ULTRAQUANT REFLEX TO GENTYP+
HIV 1 RNA Quant: 86200 copies/mL — ABNORMAL HIGH
HIV-1 RNA Quant, Log: 4.94 Log copies/mL — ABNORMAL HIGH

## 2019-06-22 LAB — VITAMIN B12: Vitamin B-12: 699 pg/mL (ref 180–914)

## 2019-06-22 LAB — HEPATITIS B SURFACE ANTIGEN: Hepatitis B Surface Ag: NONREACTIVE

## 2019-06-22 LAB — HEPATITIS A ANTIBODY, TOTAL: Hepatitis A AB,Total: NONREACTIVE

## 2019-06-22 LAB — IMMATURE PLATELET FRACTION: Immature Platelet Fraction: 4.6 % (ref 1.2–8.6)

## 2019-06-22 LAB — FOLATE: Folate: 30.8 ng/mL (ref >5.9)

## 2019-06-22 LAB — PLATELET BY CITRATE

## 2019-06-23 ENCOUNTER — Telehealth: Payer: Self-pay

## 2019-06-23 LAB — HOMOCYSTEINE: Homocysteine: 10.5 umol/L (ref 0.0–14.5)

## 2019-06-23 NOTE — Telephone Encounter (Signed)
Received a call today from [place], [place] regarding patient's HIV status. Would like to know if patient is establishing care with our office, if he is experiencing/ showing any signs/ symptoms, and to confirm contact information.  Informed DIS patient is coming to our office as a new patient. Is scheduled to see NP tomorrow at 930. [place]

## 2019-06-23 NOTE — Telephone Encounter (Signed)
COVID-19 Pre-Screening Questions:06/23/19  Do you currently have a fever (>100 F), chills or unexplained body aches? NO  Are you currently experiencing new cough, shortness of breath, sore throat, runny nose? NO .  Have you recently travelled outside the state of [place] in the last 14 days? NO .  Have you been in contact with someone that is currently pending confirmation of Covid19 testing or has been confirmed to have the Covid19 virus?  NO  **If the patient answers NO to ALL questions -  advise the patient to please call the clinic before coming to the office should any symptoms develop.     

## 2019-06-24 ENCOUNTER — Ambulatory Visit (INDEPENDENT_AMBULATORY_CARE_PROVIDER_SITE_OTHER): Payer: Self-pay | Admitting: Family

## 2019-06-24 ENCOUNTER — Encounter: Payer: Self-pay | Admitting: Family

## 2019-06-24 ENCOUNTER — Other Ambulatory Visit: Payer: Self-pay

## 2019-06-24 ENCOUNTER — Ambulatory Visit: Payer: Self-pay | Admitting: Pharmacist

## 2019-06-24 VITALS — BP 120/82 | HR 86 | Temp 97.7°F | Ht 67.0 in | Wt 141.0 lb

## 2019-06-24 DIAGNOSIS — F32A Depression, unspecified: Secondary | ICD-10-CM

## 2019-06-24 DIAGNOSIS — B20 Human immunodeficiency virus [HIV] disease: Secondary | ICD-10-CM

## 2019-06-24 DIAGNOSIS — I10 Essential (primary) hypertension: Secondary | ICD-10-CM

## 2019-06-24 DIAGNOSIS — Z113 Encounter for screening for infections with a predominantly sexual mode of transmission: Secondary | ICD-10-CM

## 2019-06-24 DIAGNOSIS — F329 Major depressive disorder, single episode, unspecified: Secondary | ICD-10-CM

## 2019-06-24 MED ORDER — METOPROLOL SUCCINATE ER 25 MG PO TB24: 25.0000 mg | ORAL_TABLET | Freq: Every evening | ORAL | 0 refills | Status: DC

## 2019-06-24 MED ORDER — ASPIRIN EC 81 MG PO TBEC: 81.0000 mg | DELAYED_RELEASE_TABLET | Freq: Every evening | ORAL | 0 refills | Status: DC

## 2019-06-24 MED ORDER — FOLIC ACID 1 MG PO TABS: 1.0000 mg | ORAL_TABLET | Freq: Every evening | ORAL | 0 refills | Status: DC

## 2019-06-24 MED ORDER — SULFAMETHOXAZOLE-TRIMETHOPRIM 800-160 MG PO TABS: 1.0000 | ORAL_TABLET | Freq: Every evening | ORAL | 3 refills | Status: DC

## 2019-06-24 MED ORDER — GENVOYA 150-150-200-10 MG PO TABS: 1.0000 | ORAL_TABLET | Freq: Every evening | ORAL | 3 refills | Status: DC

## 2019-06-24 MED ORDER — AMLODIPINE BESYLATE 10 MG PO TABS: 10.0000 mg | ORAL_TABLET | Freq: Every day | ORAL | 0 refills | Status: DC

## 2019-06-24 MED ORDER — VITAMIN D 25 MCG (1000 UNIT) PO TABS: 1000.0000 [IU] | ORAL_TABLET | Freq: Every day | ORAL | 0 refills | Status: DC

## 2019-06-24 MED ORDER — RISPERIDONE 2 MG PO TABS: 2.0000 mg | ORAL_TABLET | Freq: Every day | ORAL | 0 refills | Status: DC

## 2019-06-24 NOTE — Patient Instructions (Signed)
Nice to meet you.  Continue to take your Genvoya as prescribed daily along with Bactrim daily.  Refills of your medication have been sent to the pharmacy.  Please follow up with your primary care office for the remainder of your medications.   We will plan to follow up in 6 weeks or sooner if needed with lab work 1-2 weeks prior to your appointment.  Continue to work with Caryl Pina from [facility].  Have a great day and stay safe!

## 2019-06-24 NOTE — Assessment & Plan Note (Signed)
Blood pressure adequately controlled today with no signs/symptoms of endorgan damage at present or red flags concerns.  Continue current dose of amlodipine and metoprolol.

## 2019-06-24 NOTE — Assessment & Plan Note (Signed)
Previously on risperidone.  He has no suicidal ideations and no signs of psychosis at present.  We will need to obtain records to determine why he is taking risperidone for which he indicates depression.

## 2019-06-24 NOTE — Progress Notes (Signed)
Subjective:    Patient ID: Mike Holland, male    DOB: August 15, 1965, 54 y.o.   MRN: [id number]  Chief Complaint  Patient presents with  . HIV Positive/AIDS    HPI:  Mike Holland is a 54 y.o. male with previous medical history of depression, HIV disease, hypertension, and status post pacemaker placement presenting today to establish/transfer care for HIV disease.  Mike Holland was initially diagnosed with HIV disease in 35 while living in [place].  He was admitted to the hospital with likely acute retroviral syndrome at the time.  Previous medication regimens have included Stribild and one other medication that he cannot remember and was most recently switched to [place] approximately 2 to 3 years ago which she has been on since.  Denies any history of opportunistic infection.  Was under care by infectious disease in [place].  Risk factor for acquiring HIV includes MSM.  He recently moved to [place] from [place] and is currently living with his niece.  Mike Holland initial clinic blood work completed on 06/10/2019 shows a viral load of 86,200 and CD4 count of 175.  HLA B5 701 and QuantiFERON gold were negative.  He is serologically immune to hepatitis B.  No acute infection with hepatitis C.  STI testing for gonorrhea, chlamydia, and syphilis were negative.  Kidney function, liver function, electrolytes within normal ranges.  Lipid profile with LDL of 115, HDL of 32, and triglycerides 133.  Mike Holland is uninsured and completed his financial assistance paperwork for [employer] on 06/10/2019 which has since been approved.  He has been on Social Security disability for several years now and not currently working.  He is a previous history of marijuana use but not currently.  No alcohol consumption and is a light tobacco smoker of cigars and black and milds.  Denies any feelings of being down, depressed, or hopeless recently.  He does have family in the [place] areas.  He has  dentures and has not seen a dentist in several years.  Not currently sexually active and declines condoms.   No Known Allergies    Outpatient Medications Prior to Visit  Medication Sig Dispense Refill  . amLODipine (NORVASC) 10 MG tablet Take 1 tablet (10 mg total) by mouth daily. 30 tablet 0  . metoprolol succinate (TOPROL-XL) 25 MG 24 hr tablet Take 1 tablet (25 mg total) by mouth every evening. 30 tablet 0  . risperiDONE (RISPERDAL) 2 MG tablet Take 1 tablet (2 mg total) by mouth at bedtime. 30 tablet 0  . azithromycin (ZITHROMAX) 600 MG tablet Take 1 tablet (600 mg total) by mouth every Wednesday. (Patient not taking: Reported on 06/24/2019) 5 tablet 0  . valACYclovir (VALTREX) 500 MG tablet Take 1 tablet (500 mg total) by mouth every evening. (Patient not taking: Reported on 06/24/2019) 30 tablet 0  . Vitamin D, Ergocalciferol, (DRISDOL) 1.25 MG (50000 UT) CAPS capsule Take 1 capsule (50,000 Units total) by mouth every Wednesday. (Patient not taking: Reported on 06/24/2019) 5 capsule 0  . aspirin EC 81 MG tablet Take 1 tablet (81 mg total) by mouth every evening. (Patient not taking: Reported on 06/24/2019) 30 tablet 0  . cholecalciferol (VITAMIN D3) 25 MCG (1000 UT) tablet Take 1 tablet (1,000 Units total) by mouth daily. (Patient not taking: Reported on 06/24/2019) 30 tablet 0  . elvitegravir-cobicistat-emtricitabine-tenofovir (GENVOYA) 150-150-200-10 MG TABS tablet Take 1 tablet by mouth every evening. (Patient not taking: Reported on 06/24/2019) 30  tablet 0  . folic acid (FOLVITE) 1 MG tablet Take 1 tablet (1 mg total) by mouth every evening. (Patient not taking: Reported on 06/24/2019) 30 tablet 0  . sulfamethoxazole-trimethoprim (BACTRIM DS) 800-160 MG tablet Take 1 tablet by mouth every evening. (Patient not taking: Reported on 06/24/2019) 30 tablet 0   No facility-administered medications prior to visit.     Past Medical History:  Diagnosis Date  . Depression   . HIV (human  immunodeficiency virus infection) ([place])   . Hypertension   . Pacemaker       Past Surgical History:  Procedure Laterality Date  . PACEMAKER PLACEMENT        Family History  Problem Relation Age of Onset  . Heart disease Mother   . Heart disease Father       Social History   Socioeconomic History  . Marital status: Single    Spouse name: Not on file  . Number of children: 1  . Years of education: 20  . Highest education level: Not on file  Occupational History  . Occupation: Disability    Comment: Multiple reasons  Tobacco Use  . Smoking status: Light Tobacco Smoker    Types: Cigars  . Smokeless tobacco: Never Used  . Tobacco comment: 1-2 black and milds per day   Substance and Sexual Activity  . Alcohol use: Not Currently    Comment: holidays   . Drug use: Not Currently    Types: Marijuana  . Sexual activity: Not Currently    Partners: Male    Birth control/protection: Condom    Comment: givencondoms  Other Topics Concern  . Not on file  Social History Narrative  . Not on file   Social Determinants of Health   Financial Resource Strain:   . Difficulty of Paying Living Expenses: Not on file  Food Insecurity:   . Worried About Charity fundraiser in the Last Year: Not on file  . Ran Out of Food in the Last Year: Not on file  Transportation Needs:   . Lack of Transportation (Medical): Not on file  . Lack of Transportation (Non-Medical): Not on file  Physical Activity:   . Days of Exercise per Week: Not on file  . Minutes of Exercise per Session: Not on file  Stress:   . Feeling of Stress : Not on file  Social Connections:   . Frequency of Communication with Friends and Family: Not on file  . Frequency of Social Gatherings with Friends and Family: Not on file  . Attends Religious Services: Not on file  . Active Member of Clubs or Organizations: Not on file  . Attends Archivist Meetings: Not on file  . Marital Status: Not on file    Intimate Partner Violence:   . Fear of Current or Ex-Partner: Not on file  . Emotionally Abused: Not on file  . Physically Abused: Not on file  . Sexually Abused: Not on file      Review of Systems  Constitutional: Negative for appetite change, chills, fatigue, fever and unexpected weight change.  Eyes: Negative for visual disturbance.  Respiratory: Negative for cough, chest tightness, shortness of breath and wheezing.   Cardiovascular: Negative for chest pain and leg swelling.  Gastrointestinal: Negative for abdominal pain, constipation, diarrhea, nausea and vomiting.  Genitourinary: Negative for dysuria, flank pain, frequency, genital sores, hematuria and urgency.  Skin: Negative for rash.  Allergic/Immunologic: Negative for immunocompromised state.  Neurological: Negative for dizziness and headaches.  Objective:    BP 120/82   Pulse 86   Temp 97.7 F (36.5 C) (Oral)   Ht '5\' 7"'  (1.702 m)   Wt 141 lb (64 kg)   SpO2 98%   BMI 22.08 kg/m  Nursing note and vital signs reviewed.  Physical Exam Constitutional:      General: He is not in acute distress.    Appearance: He is well-developed.     Comments: Seated in a chair; pleasant; appears older than stated age.  Eyes:     Conjunctiva/sclera: Conjunctivae normal.  Cardiovascular:     Rate and Rhythm: Normal rate and regular rhythm.     Heart sounds: Normal heart sounds. No murmur. No friction rub. No gallop.   Pulmonary:     Effort: Pulmonary effort is normal. No respiratory distress.     Breath sounds: Normal breath sounds. No wheezing or rales.  Chest:     Chest wall: No tenderness.  Abdominal:     General: Bowel sounds are normal.     Palpations: Abdomen is soft.     Tenderness: There is no abdominal tenderness.  Musculoskeletal:     Cervical back: Neck supple.  Lymphadenopathy:     Cervical: No cervical adenopathy.  Skin:    General: Skin is warm and dry.     Findings: No rash.  Neurological:      Mental Status: He is alert and oriented to person, place, and time.  Psychiatric:        Behavior: Behavior normal.        Thought Content: Thought content normal.        Judgment: Judgment normal.         Assessment & Plan:   Patient Active Problem List   Diagnosis Date Noted  . HIV disease ([place]) 06/24/2019  . Essential hypertension 06/24/2019  . Depression 06/24/2019     Problem List Items Addressed This Visit      Cardiovascular and Mediastinum   Essential hypertension    Blood pressure adequately controlled today with no signs/symptoms of endorgan damage at present or red flags concerns.  Continue current dose of amlodipine and metoprolol.      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  Fortunately he has no signs/symptoms of opportunistic infection with concern for possible development of underlying HIV dementia as his description/history provided is not consistent.  Working to obtain documentation from previous providers.  Discussed plan of care and review blood work with time for questions.  Continue current dose of Genvoya for now may consider Biktarvy going forward.  Continue Bactrim for OI prophylaxis.  Plan for follow-up in 6 weeks or sooner if needed with lab work 1 to 2 weeks prior to appointment.      Relevant Medications   sulfamethoxazole-trimethoprim (BACTRIM DS) 800-160 MG tablet   elvitegravir-cobicistat-emtricitabine-tenofovir (GENVOYA) 150-150-200-10 MG TABS tablet   Other Relevant Orders   HIV-1 RNA quant-no reflex-bld   T-helper cell (CD4)- ([facility] clinic only)   Comprehensive metabolic panel   Depression    Previously on  risperidone.  He has no suicidal ideations and  no signs of psychosis at present.  We will need to obtain records to determine why he is taking risperidone for which he indicates depression.      Relevant Medications   risperiDONE (RISPERDAL) 2 MG tablet    Other Visit Diagnoses    Screening for STDs (sexually transmitted diseases)           I have changed Mike Holland's cholecalciferol. I am also having him maintain his azithromycin, valACYclovir, Vitamin D (Ergocalciferol), sulfamethoxazole-trimethoprim, Genvoya, risperiDONE, metoprolol succinate, folic acid, aspirin EC, and amLODipine.   Meds ordered this encounter  Medications  . sulfamethoxazole-trimethoprim (BACTRIM DS) 800-160 MG tablet    Sig: Take 1 tablet by mouth every evening.    Dispense:  30 tablet    Refill:  3    Order Specific Question:   Supervising Provider    Answer:   Carlyle Basques [[id number]]  . elvitegravir-cobicistat-emtricitabine-tenofovir (GENVOYA) 150-150-200-10 MG TABS tablet    Sig: Take 1 tablet by mouth every evening.    Dispense:  30 tablet    Refill:  3    Order Specific Question:   Supervising Provider    Answer:   Carlyle Basques [[id number]]  . risperiDONE (RISPERDAL) 2 MG tablet    Sig: Take 1 tablet (2 mg total) by mouth at bedtime.    Dispense:  30 tablet    Refill:  0    Order Specific Question:   Supervising Provider    Answer:   Carlyle Basques [[id number]]  . metoprolol succinate (TOPROL-XL) 25 MG 24 hr tablet    Sig: Take 1 tablet (25 mg total) by mouth every evening.    Dispense:  30 tablet    Refill:  0    Order Specific Question:   Supervising Provider    Answer:   Carlyle Basques [[id number]]  . folic acid (FOLVITE) 1 MG tablet    Sig: Take 1 tablet (1 mg total) by mouth every evening.    Dispense:  30 tablet    Refill:  0    Order Specific Question:   Supervising Provider    Answer:   Carlyle Basques [[id number]]  . cholecalciferol (VITAMIN D3) 25 MCG (1000 UNIT) tablet    Sig: Take 1 tablet  (1,000 Units total) by mouth daily.    Dispense:  30 tablet    Refill:  0    Order Specific Question:   Supervising Provider    Answer:   Carlyle Basques [[id number]]  . aspirin EC 81 MG tablet    Sig: Take 1 tablet (81 mg total) by mouth every evening.    Dispense:  30 tablet    Refill:  0    Order Specific Question:   Supervising Provider    Answer:   Carlyle Basques [[id number]]  . amLODipine (NORVASC) 10 MG tablet    Sig: Take 1 tablet (10 mg total) by mouth daily.    Dispense:  30 tablet    Refill:  0    Order Specific Question:   Supervising Provider    Answer:   Carlyle Basques [[id number]]     Follow-up: Return in about 6 weeks (around 08/05/2019), or if symptoms worsen or fail to improve.    Terri Piedra, MSN, FNP-C Nurse Practitioner [facility] for Infectious Disease [place] number: [phone]

## 2019-06-24 NOTE — Assessment & Plan Note (Signed)
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  Fortunately he has no signs/symptoms of opportunistic infection with concern for possible development of underlying HIV dementia as his description/history provided is not consistent.  Working to obtain documentation from previous providers.  Discussed plan of care and review blood work with time for questions.  Continue current dose of Genvoya for now may consider Biktarvy going forward.  Continue Bactrim for OI prophylaxis.  Plan for follow-up in 6 weeks or sooner if needed with lab work 1 to 2 weeks prior to appointment.

## 2019-06-27 ENCOUNTER — Telehealth: Payer: Self-pay | Admitting: Hematology and Oncology

## 2019-06-27 NOTE — Telephone Encounter (Signed)
Scheduled per los. Called and spoke with patient. Confirmed appt 

## 2019-06-29 ENCOUNTER — Telehealth: Payer: Self-pay | Admitting: *Deleted

## 2019-06-29 ENCOUNTER — Ambulatory Visit (HOSPITAL_COMMUNITY)
Admission: RE | Admit: 2019-06-29 | Discharge: 2019-06-29 | Disposition: A | Discharge status: Home / Self Care | Payer: Medicaid Other | Source: Ambulatory Visit | Attending: Hematology and Oncology | Admitting: Hematology and Oncology

## 2019-06-29 ENCOUNTER — Other Ambulatory Visit: Payer: Self-pay

## 2019-06-29 DIAGNOSIS — D696 Thrombocytopenia, unspecified: Secondary | ICD-10-CM | POA: Insufficient documentation

## 2019-06-29 LAB — METHYLMALONIC ACID, SERUM: Methylmalonic Acid, Quantitative: 105 nmol/L (ref 0–378)

## 2019-06-29 NOTE — Telephone Encounter (Signed)
Notified of message below

## 2019-06-29 NOTE — Telephone Encounter (Signed)
-----   Message from Otila Kluver, RN sent at 06/29/2019  4:14 PM EST -----  ----- Message ----- From: Orson Slick, MD Sent: 06/27/2019   9:08 AM EST To: Otila Kluver, RN  Please call Mr. Woodworth to let him know his Plts have increased markedly up to 131. I think that his Plt dropped due to missed doses of his HIV medication. He should have had an ID visit on last Friday. We can f/u with him in about 3 months to assure his Plt are stable.  Colan Neptune  ----- Message ----- From: Buel Ream, Lab In [place] Sent: 06/22/2019  10:19 AM EST To: Orson Slick, MD

## 2019-06-29 NOTE — Telephone Encounter (Signed)
-----   Message from Otila Kluver, RN sent at 06/29/2019  4:14 PM EST -----  ----- Message ----- From: Orson Slick, MD Sent: 06/23/2019   9:35 AM EST To: Otila Kluver, RN  Please let Mike Holland know his platelets are at 131, near normal! I think his drop in Plt was due to him not being on his HIV medications. The rest of his bloodwork looked fine. He plans to see ID tomorrow, emphasize this is very important. We will plan to see him back in 3 months to assure his counts are stable.   Mike Holland  ----- Message ----- From: Buel Ream, Lab In [place] Sent: 06/22/2019  10:19 AM EST To: Orson Slick, MD

## 2019-07-01 ENCOUNTER — Telehealth: Payer: Self-pay

## 2019-07-01 NOTE — Telephone Encounter (Signed)
TCT patient about results from his abdominal ultrasound and that it showed no concerning abnormalities. Enquired about establishing an infectious disease doctor for which patient voiced having one. Encouraged that he keep up with taking his HIV medications; per Dr. Lorenso Courier, his low WBC and Plt counts are from him missing his HIV medications. Patient voiced understanding.

## 2019-07-01 NOTE — Telephone Encounter (Signed)
-----   Message from Otila Kluver, RN sent at 07/01/2019  4:17 PM EST -----  ----- Message ----- From: Orson Slick, MD Sent: 07/01/2019  11:53 AM EST To: Otila Kluver, RN  Please call Mr. Guidone to let him know his abdominal ultrasound showed no concerning abnormalities. Please assure he has established with an infectious disease doctor. Emphasize that the reason for his low WBC and Plt counts are his missing HIV medications.   Colan Neptune ----- Message ----- From: Buel Ream, Rad Results In Sent: 06/29/2019   4:07 PM EST To: Orson Slick, MD

## 2019-07-19 ENCOUNTER — Other Ambulatory Visit: Payer: Self-pay | Admitting: Family

## 2019-07-19 DIAGNOSIS — I10 Essential (primary) hypertension: Secondary | ICD-10-CM

## 2019-07-28 ENCOUNTER — Telehealth: Payer: Self-pay

## 2019-07-28 NOTE — Telephone Encounter (Signed)
COVID-19 Pre-Screening Questions:07/28/19  Do you currently have a fever (>100 F), chills or unexplained body aches? NO  Are you currently experiencing new cough, shortness of breath, sore throat, runny nose?NO .  Have you recently travelled outside the state of [place] in the last 14 days? NO .  Have you been in contact with someone that is currently pending confirmation of Covid19 testing or has been confirmed to have the [place] virus? NO  **If the patient answers NO to ALL questions -  advise the patient to please call the clinic before coming to the office should any symptoms develop.

## 2019-07-29 ENCOUNTER — Other Ambulatory Visit: Payer: Self-pay

## 2019-07-29 DIAGNOSIS — B20 Human immunodeficiency virus [HIV] disease: Secondary | ICD-10-CM

## 2019-07-29 LAB — URINALYSIS, ROUTINE W REFLEX MICROSCOPIC
Bacteria, UA: NONE SEEN /HPF
Bilirubin Urine: NEGATIVE
Glucose, UA: NEGATIVE
Hgb urine dipstick: NEGATIVE
Hyaline Cast: NONE SEEN /LPF
Ketones, ur: NEGATIVE
Leukocytes,Ua: NEGATIVE
Nitrite: NEGATIVE
Protein, ur: NEGATIVE
Specific Gravity, Urine: 1.024 (ref 1.001–1.03)
Squamous Epithelial / HPF: NONE SEEN /HPF (ref <=5)
WBC, UA: NONE SEEN /HPF (ref 0–5)
pH: 6 (ref 5.0–8.0)

## 2019-07-29 LAB — T-HELPER CELL (CD4) - (RCID CLINIC ONLY)
CD4 % Helper T Cell: 10 % — ABNORMAL LOW (ref 33–65)
CD4 T Cell Abs: 202 /uL — ABNORMAL LOW (ref 400–1790)

## 2019-08-01 LAB — HIV-1 RNA QUANT-NO REFLEX-BLD
HIV 1 RNA Quant: 201 copies/mL — ABNORMAL HIGH
HIV-1 RNA Quant, Log: 2.3 Log copies/mL — ABNORMAL HIGH

## 2019-08-01 LAB — COMPREHENSIVE METABOLIC PANEL
AG Ratio: 0.7 (calc) — ABNORMAL LOW (ref 1.0–2.5)
ALT: 12 U/L (ref 9–46)
AST: 17 U/L (ref 10–35)
Albumin: 3.9 g/dL (ref 3.6–5.1)
Alkaline phosphatase (APISO): 103 U/L (ref 35–144)
BUN: 11 mg/dL (ref 7–25)
CO2: 24 mmol/L (ref 20–32)
Calcium: 9.6 mg/dL (ref 8.6–10.3)
Chloride: 105 mmol/L (ref 98–110)
Creat: 1.02 mg/dL (ref 0.70–1.33)
Globulin: 5.4 g/dL (calc) — ABNORMAL HIGH (ref 1.9–3.7)
Glucose, Bld: 97 mg/dL (ref 65–99)
Potassium: 4.1 mmol/L (ref 3.5–5.3)
Sodium: 135 mmol/L (ref 135–146)
Total Bilirubin: 0.3 mg/dL (ref 0.2–1.2)
Total Protein: 9.3 g/dL — ABNORMAL HIGH (ref 6.1–8.1)

## 2019-08-19 ENCOUNTER — Encounter: Payer: Self-pay | Admitting: Family

## 2019-08-19 ENCOUNTER — Ambulatory Visit (INDEPENDENT_AMBULATORY_CARE_PROVIDER_SITE_OTHER): Payer: Self-pay | Admitting: Family

## 2019-08-19 ENCOUNTER — Other Ambulatory Visit: Payer: Self-pay

## 2019-08-19 VITALS — BP 102/68 | HR 62 | Temp 98.0°F | Ht 69.0 in | Wt 150.0 lb

## 2019-08-19 DIAGNOSIS — I10 Essential (primary) hypertension: Secondary | ICD-10-CM

## 2019-08-19 DIAGNOSIS — Z0000 Encounter for general adult medical examination without abnormal findings: Secondary | ICD-10-CM | POA: Insufficient documentation

## 2019-08-19 DIAGNOSIS — B20 Human immunodeficiency virus [HIV] disease: Secondary | ICD-10-CM

## 2019-08-19 MED ORDER — SULFAMETHOXAZOLE-TRIMETHOPRIM 800-160 MG PO TABS: 1.0000 | ORAL_TABLET | Freq: Every evening | ORAL | 3 refills | Status: DC

## 2019-08-19 MED ORDER — GENVOYA 150-150-200-10 MG PO TABS: 1.0000 | ORAL_TABLET | Freq: Every evening | ORAL | 3 refills | Status: DC

## 2019-08-19 NOTE — Assessment & Plan Note (Signed)
Blood pressure well controlled today at 102/68 and below goal of 140/90.  Continue current dose of metoprolol.

## 2019-08-19 NOTE — Patient Instructions (Addendum)
Nice to see you.  Continue to take you Genvoya and Bactrim as prescribed.   Refills have been sent to the pharmacy.  Plan for follow up in 6 weeks or sooner if needed with lab work on the same day.  Have a great day and stay safe!

## 2019-08-19 NOTE — Progress Notes (Signed)
Subjective:    Patient ID: Mike Holland, male    DOB: 21-May-1966, 54 y.o.   MRN: [id number]  Chief Complaint  Patient presents with   Follow-up    refills today please      HPI:  Mike Holland is a 54 y.o. male with HIV/AIDS who was last seen in the office on 06/24/2019 with poorly controlled HIV with a viral load of 86,000 and CD4 count of 175.  He was restarted on his Genvoya supplemented with Bactrim for OI prophylaxis.  Most recent blood work completed on 07/29/2019 with a viral load of 201 and CD4 count of 202.  Mike Holland has been taking his Genvoya and Bactrim as prescribed daily with no missed doses since his last office visit.  Overall feeling well today with no new concerns/complaints. Denies fevers, chills, night sweats, headaches, changes in vision, neck pain/stiffness, nausea, diarrhea, vomiting, lesions or rashes.  Mike Holland has no problems obtaining his medication from the pharmacy and remains covered through [employer] [facility] [employer] which is up-to-date.  Denies feelings of being down, depressed, or hopeless recently.  He does smoke marijuana 1-2 times per month on average.  No alcohol consumption.  He does smoke black and milds and cigars on occasion.  Not currently sexually active and declines condoms and STD testing.  He is potentially interested in the Covid vaccination.   No Known Allergies    Outpatient Medications Prior to Visit  Medication Sig Dispense Refill   amLODipine (NORVASC) 10 MG tablet TAKE 1 TABLET(10 MG) BY MOUTH DAILY 30 tablet 0   ASPIRIN LOW DOSE 81 MG EC tablet TAKE 1 TABLET(81 MG) BY MOUTH EVERY EVENING 30 tablet 0   cholecalciferol (VITAMIN D3) 25 MCG (1000 UNIT) tablet Take 1 tablet (1,000 Units total) by mouth daily. 30 tablet 0   folic acid (FOLVITE) 1 MG tablet TAKE 1 TABLET(1 MG) BY MOUTH EVERY EVENING 30 tablet 0   metoprolol succinate (TOPROL-XL) 25 MG 24 hr tablet TAKE 1 TABLET(25 MG) BY MOUTH EVERY EVENING 30 tablet 0   risperiDONE  (RISPERDAL) 2 MG tablet Take 1 tablet (2 mg total) by mouth at bedtime. 30 tablet 0   elvitegravir-cobicistat-emtricitabine-tenofovir (GENVOYA) 150-150-200-10 MG TABS tablet Take 1 tablet by mouth every evening. 30 tablet 3   sulfamethoxazole-trimethoprim (BACTRIM DS) 800-160 MG tablet Take 1 tablet by mouth every evening. 30 tablet 3   valACYclovir (VALTREX) 500 MG tablet Take 1 tablet (500 mg total) by mouth every evening. (Patient not taking: Reported on 06/24/2019) 30 tablet 0   Vitamin D, Ergocalciferol, (DRISDOL) 1.25 MG (50000 UT) CAPS capsule Take 1 capsule (50,000 Units total) by mouth every Wednesday. (Patient not taking: Reported on 06/24/2019) 5 capsule 0   azithromycin (ZITHROMAX) 600 MG tablet Take 1 tablet (600 mg total) by mouth every Wednesday. (Patient not taking: Reported on 06/24/2019) 5 tablet 0   No facility-administered medications prior to visit.     Past Medical History:  Diagnosis Date   Depression    HIV (human immunodeficiency virus infection) ([place])    Hypertension    Pacemaker      Past Surgical History:  Procedure Laterality Date   PACEMAKER PLACEMENT      Review of Systems  Constitutional: Negative for appetite change, chills, fatigue, fever and unexpected weight change.  Eyes: Negative for visual disturbance.  Respiratory: Negative for cough, chest tightness, shortness of breath and wheezing.   Cardiovascular: Negative for chest pain and leg swelling.  Gastrointestinal: Negative for abdominal  pain, constipation, diarrhea, nausea and vomiting.  Genitourinary: Negative for dysuria, flank pain, frequency, genital sores, hematuria and urgency.  Skin: Negative for rash.  Allergic/Immunologic: Negative for immunocompromised state.  Neurological: Negative for dizziness and headaches.      Objective:    BP 102/68    Pulse 62    Temp 98 F (36.7 C) (Oral)    Ht 5\' 9"  (1.753 m)    Wt 150 lb (68 kg)    SpO2 96%    BMI 22.15 kg/m  Nursing note and  vital signs reviewed.  Physical Exam Constitutional:      General: He is not in acute distress.    Appearance: He is well-developed.     Comments: Seated in the chair; pleasant  Eyes:     Conjunctiva/sclera: Conjunctivae normal.  Cardiovascular:     Rate and Rhythm: Normal rate and regular rhythm.     Heart sounds: Normal heart sounds. No murmur. No friction rub. No gallop.   Pulmonary:     Effort: Pulmonary effort is normal. No respiratory distress.     Breath sounds: Normal breath sounds. No wheezing or rales.  Chest:     Chest wall: No tenderness.  Abdominal:     General: Bowel sounds are normal.     Palpations: Abdomen is soft.     Tenderness: There is no abdominal tenderness.  Musculoskeletal:     Cervical back: Neck supple.  Lymphadenopathy:     Cervical: No cervical adenopathy.  Skin:    General: Skin is warm and dry.     Findings: No rash.  Neurological:     Mental Status: He is alert and oriented to person, place, and time.  Psychiatric:        Behavior: Behavior normal.        Thought Content: Thought content normal.        Judgment: Judgment normal.      Depression screen [facility] 2/9 08/19/2019 06/24/2019 05/25/2019  Decreased Interest 0 0 0  Down, Depressed, Hopeless 0 0 0  PHQ - 2 Score 0 0 0       Assessment & Plan:    Patient Active Problem List   Diagnosis Date Noted   Healthcare maintenance 08/19/2019   HIV disease ([place]) 06/24/2019   Essential hypertension 06/24/2019   Depression 06/24/2019     Problem List Items Addressed This Visit      Cardiovascular and Mediastinum   Essential hypertension    Blood pressure well controlled today at 102/68 and below goal of 140/90.  Continue current dose of metoprolol.        Other   HIV disease [facility])    Mike Holland has improved viral load now down to 201 with current dose of Genvoya supplement with Bactrim for OI prophylaxis.  No signs/symptoms of opportunistic infection although remains at risk for  opportunistic infection.  He has no problems obtaining his medication from the pharmacy and financial assistance is up-to-date.  We reviewed his lab work and discussed the plan of care.  Continue current dose of Genvoya supplemented with Bactrim for OI prophylaxis.  Plan for follow-up in 6 weeks or sooner if needed with lab work on the same day.      Relevant Medications   elvitegravir-cobicistat-emtricitabine-tenofovir (GENVOYA) 150-150-200-10 MG TABS tablet   sulfamethoxazole-trimethoprim (BACTRIM DS) 800-160 MG tablet   Healthcare maintenance     We will hold vaccinations today with potential for Covid vaccination in the near future.  Discussed importance  of safe sexual practice to reduce risk of STI.  Condoms declined.          I have discontinued Verlee Monte. Morten's azithromycin. I am also having him maintain his valACYclovir, Vitamin D (Ergocalciferol), risperiDONE, cholecalciferol, amLODipine, folic acid, metoprolol succinate, Aspirin Low Dose, Genvoya, and sulfamethoxazole-trimethoprim.   Meds ordered this encounter  Medications   elvitegravir-cobicistat-emtricitabine-tenofovir (GENVOYA) 150-150-200-10 MG TABS tablet    Sig: Take 1 tablet by mouth every evening.    Dispense:  30 tablet    Refill:  3    Order Specific Question:   Supervising Provider    Answer:   Baxter Flattery, CYNTHIA [[id number]]   sulfamethoxazole-trimethoprim (BACTRIM DS) 800-160 MG tablet    Sig: Take 1 tablet by mouth every evening.    Dispense:  30 tablet    Refill:  3    Order Specific Question:   Supervising Provider    Answer:   Carlyle Basques [[id number]]     Follow-up: Return in about 6 weeks (around 09/30/2019).   Terri Piedra, MSN, FNP-C Nurse Practitioner [facility] for Infectious Disease [place] number: [phone]

## 2019-08-19 NOTE — Assessment & Plan Note (Addendum)
Mr. Seber has improved viral load now down to 201 with current dose of Genvoya supplement with Bactrim for OI prophylaxis.  No signs/symptoms of opportunistic infection although remains at risk for opportunistic infection.  He has no problems obtaining his medication from the pharmacy and financial assistance is up-to-date.  We reviewed his lab work and discussed the plan of care.  Continue current dose of Genvoya supplemented with Bactrim for OI prophylaxis.  Plan for follow-up in 6 weeks or sooner if needed with lab work on the same day.

## 2019-08-19 NOTE — Assessment & Plan Note (Signed)
   We will hold vaccinations today with potential for Covid vaccination in the near future.  Discussed importance of safe sexual practice to reduce risk of STI.  Condoms declined.

## 2019-08-22 ENCOUNTER — Other Ambulatory Visit: Payer: Self-pay | Admitting: Family

## 2019-08-22 DIAGNOSIS — F329 Major depressive disorder, single episode, unspecified: Secondary | ICD-10-CM

## 2019-08-22 DIAGNOSIS — F32A Depression, unspecified: Secondary | ICD-10-CM

## 2019-08-22 DIAGNOSIS — I10 Essential (primary) hypertension: Secondary | ICD-10-CM

## 2019-09-19 ENCOUNTER — Telehealth: Payer: Self-pay

## 2019-09-19 NOTE — Telephone Encounter (Signed)
COVID-19 Pre-Screening Questions:09/19/19  Do you currently have a fever (>100 F), chills or unexplained body aches?NO  Are you currently experiencing new cough, shortness of breath, sore throat, runny nose?NO  .  Have you recently travelled outside the state of [place] in the last 14 days? NO  .  Have you been in contact with someone that is currently pending confirmation of Covid19 testing or has been confirmed to have the [place] virus?NO   **If the patient answers NO to ALL questions -  advise the patient to please call the clinic before coming to the office should any symptoms develop.

## 2019-09-20 ENCOUNTER — Encounter: Payer: Self-pay | Admitting: Family

## 2019-09-20 ENCOUNTER — Other Ambulatory Visit: Payer: Self-pay

## 2019-09-20 ENCOUNTER — Other Ambulatory Visit: Payer: Self-pay | Admitting: Hematology and Oncology

## 2019-09-20 ENCOUNTER — Ambulatory Visit (INDEPENDENT_AMBULATORY_CARE_PROVIDER_SITE_OTHER): Payer: Self-pay | Admitting: Family

## 2019-09-20 VITALS — BP 113/74 | HR 62 | Wt 148.0 lb

## 2019-09-20 DIAGNOSIS — Z0000 Encounter for general adult medical examination without abnormal findings: Secondary | ICD-10-CM

## 2019-09-20 DIAGNOSIS — B20 Human immunodeficiency virus [HIV] disease: Secondary | ICD-10-CM

## 2019-09-20 DIAGNOSIS — D696 Thrombocytopenia, unspecified: Secondary | ICD-10-CM

## 2019-09-20 NOTE — Assessment & Plan Note (Signed)
Mike Holland continues to improve with good adherence and tolerance to his ART regimen of Genvoya supplemented with Bactrim for OI prophylaxis.  No signs/symptoms of opportunistic infection or progressive HIV disease.  We reviewed previous lab work.  Discussed the plan of care.  Check blood work today.  If CD4 count remains above 200 continue Bactrim for 1 more month and then stop.  He will continue current dose of Genvoya daily.  Plan for follow-up in 2 months or sooner if needed with lab work on the same day.

## 2019-09-20 NOTE — Progress Notes (Signed)
Subjective:    Patient ID: Mike Holland, male    DOB: Apr 19, 1966, 54 y.o.   MRN: [id number]  Chief Complaint  Patient presents with  . Follow-up    no complaints;      HPI:  Mike Holland is a 54 y.o. male with HIV/AIDS who was last seen in the office on 08/19/2019 with improved viral load from his initial office visit on 06/24/2019 at 201 with a CD4 count of 202.  He had good adherence and tolerance to his ART regimen of Genvoya.  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   Mike Holland to take his Genvoya and Bactrim as prescribed with no adverse side effects or missed doses since his last office visit.  Overall feeling very well today with no new concerns/complaints. Denies fevers, chills, night sweats, headaches, changes in vision, neck pain/stiffness, nausea, diarrhea, vomiting, lesions or rashes.  Mike Holland has no problems obtaining his medication from the pharmacy and remains covered through [employer].  Denies feelings of being down, depressed, or hopeless recently.  No recreational or illicit drug use or alcohol consumption.  He does smoke 2-3 black and milds per day on average.  He has not been to the dentist in the last 2 years.  Not currently sexually active and declines condoms.   No Known Allergies    Outpatient Medications Prior to Visit  Medication Sig Dispense Refill  . elvitegravir-cobicistat-emtricitabine-tenofovir (GENVOYA) 150-150-200-10 MG TABS tablet Take 1 tablet by mouth every evening. 30 tablet 3  . amLODipine (NORVASC) 10 MG tablet TAKE 1 TABLET(10 MG) BY MOUTH DAILY 30 tablet 0  . ASPIRIN LOW DOSE 81 MG EC tablet TAKE 1 TABLET(81 MG) BY MOUTH EVERY EVENING 30 tablet 0  . Cholecalciferol (VITAMIN D3) 25 MCG (1000 UT) CAPS TAKE 1 CAPSULE BY MOUTH DAILY 30 capsule 0  . folic acid (FOLVITE) 1 MG tablet TAKE 1 TABLET(1 MG) BY MOUTH EVERY EVENING 30 tablet 0  . metoprolol succinate (TOPROL-XL) 25 MG 24 hr tablet TAKE 1  TABLET(25 MG) BY MOUTH EVERY EVENING 30 tablet 0  . risperiDONE (RISPERDAL) 2 MG tablet TAKE 1 TABLET(2 MG) BY MOUTH AT BEDTIME 30 tablet 0  . sulfamethoxazole-trimethoprim (BACTRIM DS) 800-160 MG tablet Take 1 tablet by mouth every evening. 30 tablet 3  . valACYclovir (VALTREX) 500 MG tablet Take 1 tablet (500 mg total) by mouth every evening. (Patient not taking: Reported on 06/24/2019) 30 tablet 0  . Vitamin D, Ergocalciferol, (DRISDOL) 1.25 MG (50000 UT) CAPS capsule Take 1 capsule (50,000 Units total) by mouth every Wednesday. (Patient not taking: Reported on 06/24/2019) 5 capsule 0   No facility-administered medications prior to visit.     Past Medical History:  Diagnosis Date  . Depression   . HIV (human immunodeficiency virus infection) ([place])   . Hypertension   . Pacemaker      Past Surgical History:  Procedure Laterality Date  . PACEMAKER PLACEMENT       Review of Systems  Constitutional: Negative for appetite change, chills, fatigue, fever and unexpected weight change.  Eyes: Negative for visual disturbance.  Respiratory: Negative for cough, chest tightness, shortness of breath and wheezing.   Cardiovascular: Negative for chest pain and leg swelling.  Gastrointestinal: Negative for abdominal pain, constipation, diarrhea, nausea and vomiting.  Genitourinary: Negative for dysuria, flank pain, frequency, genital sores, hematuria and urgency.  Skin: Negative for rash.  Allergic/Immunologic: Negative for immunocompromised state.  Neurological: Negative for dizziness and headaches.  Objective:    BP 113/74   Pulse 62   Wt 148 lb (67.1 kg)   BMI 21.86 kg/m  Nursing note and vital signs reviewed.  Physical Exam Constitutional:      General: He is not in acute distress.    Appearance: He is well-developed.  Eyes:     Conjunctiva/sclera: Conjunctivae normal.  Cardiovascular:     Rate and Rhythm: Normal rate and regular rhythm.     Heart sounds: Normal heart  sounds. No murmur. No friction rub. No gallop.   Pulmonary:     Effort: Pulmonary effort is normal. No respiratory distress.     Breath sounds: Normal breath sounds. No wheezing or rales.  Chest:     Chest wall: No tenderness.  Abdominal:     General: Bowel sounds are normal.     Palpations: Abdomen is soft.     Tenderness: There is no abdominal tenderness.  Musculoskeletal:     Cervical back: Neck supple.  Lymphadenopathy:     Cervical: No cervical adenopathy.  Skin:    General: Skin is warm and dry.     Findings: No rash.  Neurological:     Mental Status: He is alert and oriented to person, place, and time.  Psychiatric:        Behavior: Behavior normal.        Thought Content: Thought content normal.        Judgment: Judgment normal.      Depression screen [facility] 2/9 09/20/2019 08/19/2019 06/24/2019 05/25/2019  Decreased Interest 0 0 0 0  Down, Depressed, Hopeless 0 0 0 0  PHQ - 2 Score 0 0 0 0       Assessment & Plan:    Patient Active Problem List   Diagnosis Date Noted  . Healthcare maintenance 08/19/2019  . HIV disease ([place]) 06/24/2019  . Essential hypertension 06/24/2019  . Depression 06/24/2019     Problem List Items Addressed This Visit      Other   HIV disease ([place]) - Primary    Mr. Mcconnel Holland to improve with good adherence and tolerance to his ART regimen of Genvoya supplemented with Bactrim for OI prophylaxis.  No signs/symptoms of opportunistic infection or progressive HIV disease.  We reviewed previous lab work.  Discussed the plan of care.  Check blood work today.  If CD4 count remains above 200 continue Bactrim for 1 more month and then stop.  He will continue current dose of Genvoya daily.  Plan for follow-up in 2 months or sooner if needed with lab work on the same day.      Relevant Orders   COMPLETE METABOLIC PANEL WITH GFR   HIV-1 RNA quant-no reflex-bld   T-helper cell (CD4)- ([facility] clinic only)   Healthcare maintenance     Hold  vaccinations today with potential for Covid vaccination.  Discussed importance of safe sexual practice to reduce risk of STI.  Condoms declined.  Referral placed to [facility] for routine care.  Due for colon cancer screening through colonoscopy.          I am having Beverely Low D. Halliwell maintain his valACYclovir, Vitamin D (Ergocalciferol), Genvoya, sulfamethoxazole-trimethoprim, metoprolol succinate, Aspirin Low Dose, folic acid, Vitamin D3, amLODipine, and risperiDONE.   Follow-up: Return in about 2 months (around 11/20/2019).   Terri Piedra, MSN, FNP-C Nurse Practitioner [facility] for Infectious Disease [place] number: [phone]

## 2019-09-20 NOTE — Progress Notes (Deleted)
[place] [phone]   Fax:(336) [phone]  PROGRESS NOTE  Patient Care Team: Patient, No Pcp Per as PCP - General (General Practice)  Hematological/Oncological History # ***  Interval History:  Mike Holland 54 y.o. male with medical history significant for *** presents for a follow up visit. The patient's last visit was on ***. In the interim since the last visit ***  MEDICAL HISTORY:  Past Medical History:  Diagnosis Date  . Depression   . HIV (human immunodeficiency virus infection) ([place])   . Hypertension   . Pacemaker     SURGICAL HISTORY: Past Surgical History:  Procedure Laterality Date  . PACEMAKER PLACEMENT      SOCIAL HISTORY: Social History   Socioeconomic History  . Marital status: Single    Spouse name: Not on file  . Number of children: 1  . Years of education: 45  . Highest education level: Not on file  Occupational History  . Occupation: Disability    Comment: Multiple reasons  Tobacco Use  . Smoking status: Light Tobacco Smoker    Types: Cigars  . Smokeless tobacco: Never Used  . Tobacco comment: 1-2 black and milds per day   Substance and Sexual Activity  . Alcohol use: Not Currently    Comment: holidays   . Drug use: Not Currently    Types: Marijuana  . Sexual activity: Not Currently    Partners: Male    Birth control/protection: Condom    Comment: declined condoms  Other Topics Concern  . Not on file  Social History Narrative  . Not on file   Social Determinants of Health   Financial Resource Strain:   . Difficulty of Paying Living Expenses:   Food Insecurity:   . Worried About Charity fundraiser in the Last Year:   . Arboriculturist in the Last Year:   Transportation Needs:   . Film/video editor (Medical):   Marland Kitchen Lack of Transportation (Non-Medical):   Physical Activity:   . Days of Exercise per Week:   . Minutes of Exercise per Session:   Stress:   . Feeling of Stress :   Social  Connections:   . Frequency of Communication with Friends and Family:   . Frequency of Social Gatherings with Friends and Family:   . Attends Religious Services:   . Active Member of Clubs or Organizations:   . Attends Archivist Meetings:   Marland Kitchen Marital Status:   Intimate Partner Violence:   . Fear of Current or Ex-Partner:   . Emotionally Abused:   Marland Kitchen Physically Abused:   . Sexually Abused:     FAMILY HISTORY: Family History  Problem Relation Age of Onset  . Heart disease Mother   . Heart disease Father     ALLERGIES:  has No Known Allergies.  MEDICATIONS:  Current Outpatient Medications  Medication Sig Dispense Refill  . amLODipine (NORVASC) 10 MG tablet TAKE 1 TABLET(10 MG) BY MOUTH DAILY 30 tablet 0  . ASPIRIN LOW DOSE 81 MG EC tablet TAKE 1 TABLET(81 MG) BY MOUTH EVERY EVENING 30 tablet 0  . Cholecalciferol (VITAMIN D3) 25 MCG (1000 UT) CAPS TAKE 1 CAPSULE BY MOUTH DAILY 30 capsule 0  . elvitegravir-cobicistat-emtricitabine-tenofovir (GENVOYA) 150-150-200-10 MG TABS tablet Take 1 tablet by mouth every evening. 30 tablet 3  . folic acid (FOLVITE) 1 MG tablet TAKE 1 TABLET(1 MG) BY MOUTH EVERY EVENING 30 tablet 0  . metoprolol succinate (TOPROL-XL) 25 MG 24  hr tablet TAKE 1 TABLET(25 MG) BY MOUTH EVERY EVENING 30 tablet 0  . risperiDONE (RISPERDAL) 2 MG tablet TAKE 1 TABLET(2 MG) BY MOUTH AT BEDTIME 30 tablet 0  . sulfamethoxazole-trimethoprim (BACTRIM DS) 800-160 MG tablet Take 1 tablet by mouth every evening. 30 tablet 3  . valACYclovir (VALTREX) 500 MG tablet Take 1 tablet (500 mg total) by mouth every evening. (Patient not taking: Reported on 06/24/2019) 30 tablet 0  . Vitamin D, Ergocalciferol, (DRISDOL) 1.25 MG (50000 UT) CAPS capsule Take 1 capsule (50,000 Units total) by mouth every Wednesday. (Patient not taking: Reported on 06/24/2019) 5 capsule 0   No current facility-administered medications for this visit.    REVIEW OF SYSTEMS:   Constitutional: ( - )  fevers, ( - )  chills , ( - ) night sweats Eyes: ( - ) blurriness of vision, ( - ) double vision, ( - ) watery eyes Ears, nose, mouth, throat, and face: ( - ) mucositis, ( - ) sore throat Respiratory: ( - ) cough, ( - ) dyspnea, ( - ) wheezes Cardiovascular: ( - ) palpitation, ( - ) chest discomfort, ( - ) lower extremity swelling Gastrointestinal:  ( - ) nausea, ( - ) heartburn, ( - ) change in bowel habits Skin: ( - ) abnormal skin rashes Lymphatics: ( - ) new lymphadenopathy, ( - ) easy bruising Neurological: ( - ) numbness, ( - ) tingling, ( - ) new weaknesses Behavioral/Psych: ( - ) mood change, ( - ) new changes  All other systems were reviewed with the patient and are negative.  PHYSICAL EXAMINATION: ECOG PERFORMANCE STATUS: {CHL ONC ECOG [id number]}  There were no vitals filed for this visit. There were no vitals filed for this visit.  GENERAL: alert, no distress and comfortable SKIN: skin color, texture, turgor are normal, no rashes or significant lesions EYES: conjunctiva are pink and non-injected, sclera clear OROPHARYNX: no exudate, no erythema; lips, buccal mucosa, and tongue normal  NECK: supple, non-tender LYMPH:  no palpable lymphadenopathy in the cervical, axillary or inguinal LUNGS: clear to auscultation and percussion with normal breathing effort HEART: regular rate & rhythm and no murmurs and no lower extremity edema ABDOMEN: soft, non-tender, non-distended, normal bowel sounds Musculoskeletal: no cyanosis of digits and no clubbing  PSYCH: alert & oriented x 3, fluent speech NEURO: no focal motor/sensory deficits  LABORATORY DATA:  I have reviewed the data as listed CBC Latest Ref Rng & Units 06/22/2019 06/10/2019 05/26/2019  WBC 4.0 - 10.5 K/uL 3.2(L) 4.5 5.2  Hemoglobin 13.0 - 17.0 g/dL 14.6 15.9 14.5  Hematocrit 39.0 - 52.0 % 41.3 45.8 41.2  Platelets 150 - 400 K/uL 131(L) 44(L) 17(LL)    CMP Latest Ref Rng & Units 09/20/2019 07/29/2019 06/22/2019  Glucose  65 - 99 mg/dL 96 97 98  BUN 7 - 25 mg/dL 8 11 11   Creatinine 0.70 - 1.33 mg/dL 1.01 1.02 0.90  Sodium 135 - 146 mmol/L 134(L) 135 139  Potassium 3.5 - 5.3 mmol/L 3.8 4.1 3.7  Chloride 98 - 110 mmol/L 102 105 105  CO2 20 - 32 mmol/L 26 24 26   Calcium 8.6 - 10.3 mg/dL 9.6 9.6 9.2  Total Protein 6.1 - 8.1 g/dL 9.1(H) 9.3(H) 10.1(H)  Total Bilirubin 0.2 - 1.2 mg/dL 0.5 0.3 0.4  Alkaline Phos 38 - 126 U/L - - 117  AST 10 - 35 U/L 14 17 22   ALT 9 - 46 U/L 12 12 13      BLOOD FILM: ***  Review of the peripheral blood smear showed normal appearing white cells with neutrophils that were appropriately lobated and granulated. There was no predominance of bi-lobed or hyper-segmented neutrophils appreciated. No Dohle bodies were noted. There was no left shifting, immature forms or blasts noted. Lymphocytes remain normal in size without any predominance of large granular lymphocytes. Red cells show no anisopoikilocytosis, macrocytes , microcytes or polychromasia. There were no schistocytes, target cells, echinocytes, acanthocytes, dacrocytes, or stomatocytes.There was no rouleaux formation, nucleated red cells, or intra-cellular inclusions noted. The platelets are normal in size, shape, and color without any clumping evident.  RADIOGRAPHIC STUDIES: I have personally reviewed the radiological images as listed and agreed with the findings in the report. No results found.  ASSESSMENT & PLAN ***  No orders of the defined types were placed in this encounter.   All questions were answered. The patient knows to call the clinic with any problems, questions or concerns.  A total of more than {CHL ONC TIME VISIT - [id number] were spent on this encounter and over half of that time was spent on counseling and coordination of care as outlined above.   Ledell Peoples, MD Department of Hematology/Oncology [place] at [facility] Phone: [phone] Pager: [phone] Email:  [email or web address]  09/20/2019 9:45 PM

## 2019-09-20 NOTE — Patient Instructions (Signed)
Nice to see you.  We will check your blood work today.  Continue take your Bactrim and Genvoya daily as prescribed.  If your blood work looks good and your CD4 count is above 200 today we can stop the Bactrim in 1 month.  Recommend you receive your Covid vaccination which can be completed at the [facility].  They do take walk-ins.  For more information on the Covid vaccine [email or web address].  Plan for follow-up in 2 months or sooner if needed with lab work on the same day.  Have a great day and stay safe!

## 2019-09-20 NOTE — Assessment & Plan Note (Addendum)
   Hold vaccinations today with potential for Covid vaccination.  Discussed importance of safe sexual practice to reduce risk of STI.  Condoms declined.  Referral placed to [place] [facility] for routine care.  Due for colon cancer screening through colonoscopy.

## 2019-09-21 ENCOUNTER — Other Ambulatory Visit: Payer: Self-pay | Admitting: Family

## 2019-09-21 ENCOUNTER — Inpatient Hospital Stay: Payer: Self-pay

## 2019-09-21 ENCOUNTER — Inpatient Hospital Stay: Payer: Self-pay | Attending: Hematology and Oncology | Admitting: Hematology and Oncology

## 2019-09-21 DIAGNOSIS — I10 Essential (primary) hypertension: Secondary | ICD-10-CM

## 2019-09-21 LAB — T-HELPER CELL (CD4) - (RCID CLINIC ONLY)
CD4 % Helper T Cell: 10 % — ABNORMAL LOW (ref 33–65)
CD4 T Cell Abs: 201 /uL — ABNORMAL LOW (ref 400–1790)

## 2019-09-23 LAB — COMPLETE METABOLIC PANEL WITH GFR
AG Ratio: 0.8 (calc) — ABNORMAL LOW (ref 1.0–2.5)
ALT: 12 U/L (ref 9–46)
AST: 14 U/L (ref 10–35)
Albumin: 4 g/dL (ref 3.6–5.1)
Alkaline phosphatase (APISO): 129 U/L (ref 35–144)
BUN: 8 mg/dL (ref 7–25)
CO2: 26 mmol/L (ref 20–32)
Calcium: 9.6 mg/dL (ref 8.6–10.3)
Chloride: 102 mmol/L (ref 98–110)
Creat: 1.01 mg/dL (ref 0.70–1.33)
GFR, Est African American: 98 mL/min/{1.73_m2} (ref >=60)
GFR, Est Non African American: 85 mL/min/{1.73_m2} (ref >=60)
Globulin: 5.1 g/dL (calc) — ABNORMAL HIGH (ref 1.9–3.7)
Glucose, Bld: 96 mg/dL (ref 65–99)
Potassium: 3.8 mmol/L (ref 3.5–5.3)
Sodium: 134 mmol/L — ABNORMAL LOW (ref 135–146)
Total Bilirubin: 0.5 mg/dL (ref 0.2–1.2)
Total Protein: 9.1 g/dL — ABNORMAL HIGH (ref 6.1–8.1)

## 2019-09-23 LAB — HIV-1 RNA QUANT-NO REFLEX-BLD
HIV 1 RNA Quant: 83 {copies}/mL — ABNORMAL HIGH
HIV-1 RNA Quant, Log: 1.92 {Log_copies}/mL — ABNORMAL HIGH

## 2019-10-03 ENCOUNTER — Other Ambulatory Visit: Payer: Self-pay | Admitting: *Deleted

## 2019-10-03 DIAGNOSIS — I10 Essential (primary) hypertension: Secondary | ICD-10-CM

## 2019-10-03 DIAGNOSIS — B20 Human immunodeficiency virus [HIV] disease: Secondary | ICD-10-CM

## 2019-10-03 DIAGNOSIS — F32A Depression, unspecified: Secondary | ICD-10-CM

## 2019-10-03 DIAGNOSIS — F329 Major depressive disorder, single episode, unspecified: Secondary | ICD-10-CM

## 2019-10-03 MED ORDER — RISPERIDONE 2 MG PO TABS
ORAL_TABLET | ORAL | 3 refills | Status: AC
Start: 2019-10-03 — End: ?

## 2019-10-03 MED ORDER — AMLODIPINE BESYLATE 10 MG PO TABS: ORAL_TABLET | ORAL | 3 refills | Status: AC

## 2019-10-03 MED ORDER — FOLIC ACID 1 MG PO TABS: ORAL_TABLET | ORAL | 3 refills | Status: AC

## 2019-10-03 MED ORDER — METOPROLOL SUCCINATE ER 25 MG PO TB24
ORAL_TABLET | ORAL | 3 refills | Status: AC
Start: 2019-10-03 — End: ?

## 2019-10-03 MED ORDER — ASPIRIN 81 MG PO TBEC: DELAYED_RELEASE_TABLET | ORAL | 3 refills | Status: AC

## 2019-10-03 MED ORDER — VITAMIN D3 25 MCG (1000 UT) PO CAPS: 1.0000 | ORAL_CAPSULE | Freq: Every day | ORAL | 3 refills | Status: AC

## 2019-10-10 ENCOUNTER — Other Ambulatory Visit: Payer: Self-pay

## 2019-10-10 ENCOUNTER — Other Ambulatory Visit: Payer: Self-pay | Admitting: Family

## 2019-10-12 ENCOUNTER — Other Ambulatory Visit: Payer: Self-pay

## 2019-10-28 ENCOUNTER — Ambulatory Visit: Payer: Self-pay | Admitting: Family Medicine

## 2019-10-31 ENCOUNTER — Ambulatory Visit: Payer: Self-pay | Admitting: Family Medicine

## 2019-10-31 ENCOUNTER — Ambulatory Visit: Payer: Self-pay

## 2019-11-04 ENCOUNTER — Emergency Department (HOSPITAL_COMMUNITY): Payer: Medicaid Other

## 2019-11-04 ENCOUNTER — Emergency Department (HOSPITAL_COMMUNITY)
Admission: EM | Admit: 2019-11-04 | Discharge: 2019-11-04 | Disposition: A | Discharge status: Home / Self Care | Payer: Medicaid Other | Attending: Emergency Medicine | Admitting: Emergency Medicine

## 2019-11-04 ENCOUNTER — Other Ambulatory Visit: Payer: Self-pay

## 2019-11-04 ENCOUNTER — Encounter (HOSPITAL_COMMUNITY): Payer: Self-pay | Admitting: Emergency Medicine

## 2019-11-04 DIAGNOSIS — Z79899 Other long term (current) drug therapy: Secondary | ICD-10-CM | POA: Insufficient documentation

## 2019-11-04 DIAGNOSIS — R911 Solitary pulmonary nodule: Secondary | ICD-10-CM | POA: Diagnosis not present

## 2019-11-04 DIAGNOSIS — Z950 Presence of cardiac pacemaker: Secondary | ICD-10-CM | POA: Diagnosis not present

## 2019-11-04 DIAGNOSIS — Y929 Unspecified place or not applicable: Secondary | ICD-10-CM | POA: Diagnosis not present

## 2019-11-04 DIAGNOSIS — I10 Essential (primary) hypertension: Secondary | ICD-10-CM | POA: Insufficient documentation

## 2019-11-04 DIAGNOSIS — R0602 Shortness of breath: Secondary | ICD-10-CM | POA: Diagnosis not present

## 2019-11-04 DIAGNOSIS — Y9389 Activity, other specified: Secondary | ICD-10-CM | POA: Diagnosis not present

## 2019-11-04 DIAGNOSIS — F17200 Nicotine dependence, unspecified, uncomplicated: Secondary | ICD-10-CM | POA: Insufficient documentation

## 2019-11-04 DIAGNOSIS — Y999 Unspecified external cause status: Secondary | ICD-10-CM | POA: Insufficient documentation

## 2019-11-04 DIAGNOSIS — W010XXA Fall on same level from slipping, tripping and stumbling without subsequent striking against object, initial encounter: Secondary | ICD-10-CM | POA: Diagnosis not present

## 2019-11-04 DIAGNOSIS — S299XXA Unspecified injury of thorax, initial encounter: Secondary | ICD-10-CM | POA: Diagnosis present

## 2019-11-04 DIAGNOSIS — S2231XA Fracture of one rib, right side, initial encounter for closed fracture: Secondary | ICD-10-CM | POA: Insufficient documentation

## 2019-11-04 DIAGNOSIS — W19XXXA Unspecified fall, initial encounter: Secondary | ICD-10-CM

## 2019-11-04 MED ORDER — ACETAMINOPHEN 325 MG PO TABS: 650.0000 mg | ORAL_TABLET | Freq: Four times a day (QID) | ORAL | 0 refills | Status: AC | PRN

## 2019-11-04 NOTE — ED Triage Notes (Addendum)
Per EMS, sitting on couch and all of a sudden he couldn't take a full breath-no respiratory distress-patient states he had a fall a couple of days ago-having right mid rib cage pain

## 2019-11-04 NOTE — ED Provider Notes (Signed)
[facility] DEPT Provider Note   CSN: [id number] Arrival date & time: 11/04/19  1648     History Chief Complaint  Patient presents with  . Shortness of Breath  . Fall    Mike Holland is a 54 y.o. male history of HIV, hypertension, pacemaker, depression, on Risperdal, on Genvoya, presented emergency department a mechanical fall and pain in his right ribs.  He said he fell a few days ago and struck his chest hard on the ground.  He began having pain in his right rib line after that.  The pain has been tolerable and responding to Tylenol, but at times it seems to really bother him.  He feels like he cannot take a full breath and due to pain sometimes.   HPI     Past Medical History:  Diagnosis Date  . Depression   . HIV (human immunodeficiency virus infection) ([place])   . Hypertension   . Pacemaker     Patient Active Problem List   Diagnosis Date Noted  . Healthcare maintenance 08/19/2019  . HIV disease ([place]) 06/24/2019  . Essential hypertension 06/24/2019  . Depression 06/24/2019    Past Surgical History:  Procedure Laterality Date  . PACEMAKER PLACEMENT         Family History  Problem Relation Age of Onset  . Heart disease Mother   . Heart disease Father     Social History   Tobacco Use  . Smoking status: Light Tobacco Smoker    Types: Cigars  . Smokeless tobacco: Never Used  . Tobacco comment: 1-2 black and milds per day   Vaping Use  . Vaping Use: Never used  Substance Use Topics  . Alcohol use: Not Currently    Comment: holidays   . Drug use: Not Currently    Types: Marijuana    Home Medications Prior to Admission medications   Medication Sig Start Date End Date Taking? Authorizing Provider  acetaminophen (TYLENOL) 500 MG tablet Take 1,000 mg by mouth every 6 (six) hours as needed for moderate pain.   Yes [provider]  amLODipine (NORVASC) 10 MG tablet TAKE 1 TABLET(10 MG) BY MOUTH DAILY 10/03/19  Yes  Golden Circle, FNP  elvitegravir-cobicistat-emtricitabine-tenofovir (GENVOYA) 150-150-200-10 MG TABS tablet Take 1 tablet by mouth every evening. 08/19/19  Yes Golden Circle, FNP  folic acid (FOLVITE) 1 MG tablet TAKE 1 TABLET(1 MG) BY MOUTH EVERY EVENING 10/03/19  Yes Golden Circle, FNP  metoprolol succinate (TOPROL-XL) 25 MG 24 hr tablet TAKE 1 TABLET(25 MG) BY MOUTH EVERY EVENING 10/03/19  Yes Golden Circle, FNP  acetaminophen (TYLENOL) 325 MG tablet Take 2 tablets (650 mg total) by mouth every 6 (six) hours as needed for up to 30 doses for mild pain or moderate pain. 11/04/19   Wyvonnia Dusky, MD  aspirin (ASPIRIN LOW DOSE) 81 MG EC tablet TAKE 1 TABLET(81 MG) BY MOUTH EVERY EVENING Patient not taking: Reported on 11/04/2019 10/03/19   Golden Circle, FNP  Cholecalciferol (VITAMIN D3) 25 MCG (1000 UT) CAPS Take 1 capsule (1,000 Units total) by mouth daily. Patient not taking: Reported on 11/04/2019 10/03/19   Golden Circle, FNP  risperiDONE (RISPERDAL) 2 MG tablet TAKE 1 TABLET(2 MG) BY MOUTH AT BEDTIME Patient not taking: Reported on 11/04/2019 10/03/19   Golden Circle, FNP  sulfamethoxazole-trimethoprim (BACTRIM DS) 800-160 MG tablet Take 1 tablet by mouth every evening. Patient not taking: Reported on 11/04/2019 08/19/19   Mauricio Po  D, FNP  valACYclovir (VALTREX) 500 MG tablet Take 1 tablet (500 mg total) by mouth every evening. Patient not taking: Reported on 06/24/2019 05/02/19   Isla Pence, MD  Vitamin D, Ergocalciferol, (DRISDOL) 1.25 MG (50000 UT) CAPS capsule Take 1 capsule (50,000 Units total) by mouth every Wednesday. Patient not taking: Reported on 06/24/2019 05/04/19   Isla Pence, MD    Allergies    Patient has no known allergies.  Review of Systems   Review of Systems  Constitutional: Negative for chills and fever.  Eyes: Negative for pain and visual disturbance.  Respiratory: Positive for shortness of breath. Negative for cough.     Cardiovascular: Positive for chest pain. Negative for palpitations.  Gastrointestinal: Negative for abdominal pain and vomiting.  Genitourinary: Negative for dysuria and hematuria.  Musculoskeletal: Negative for arthralgias and back pain.  Skin: Negative for color change and rash.  Neurological: Negative for syncope and light-headedness.  Psychiatric/Behavioral: Negative for agitation and confusion.  All other systems reviewed and are negative.   Physical Exam Updated Vital Signs BP 114/89 (BP Location: Left Arm)   Pulse 84   Temp 97.7 F (36.5 C) (Oral)   Resp 19   SpO2 99%   Physical Exam Vitals and nursing note reviewed.  Constitutional:      Appearance: He is well-developed.  HENT:     Head: Normocephalic and atraumatic.  Eyes:     Conjunctiva/sclera: Conjunctivae normal.  Cardiovascular:     Rate and Rhythm: Normal rate and regular rhythm.     Heart sounds: No murmur heard.   Pulmonary:     Effort: Pulmonary effort is normal. No respiratory distress.     Breath sounds: Normal breath sounds.  Abdominal:     Palpations: Abdomen is soft.     Tenderness: There is no abdominal tenderness.  Musculoskeletal:     Cervical back: Neck supple.     Comments: Right lateral rib tenderness  Skin:    General: Skin is warm and dry.  Neurological:     General: No focal deficit present.     Mental Status: He is alert and oriented to person, place, and time.     ED Results / Procedures / Treatments   Labs (all labs ordered are listed, but only abnormal results are displayed) Labs Reviewed - No data to display  EKG EKG Interpretation  Date/Time:  Friday November 04 2019 17:00:34 EDT Ventricular Rate:  80 PR Interval:    QRS Duration: 86 QT Interval:  356 QTC Calculation: 411 R Axis:   87 Text Interpretation: Sinus rhythm Probable left atrial enlargement 12 Lead; [place]ar No STEMI Confirmed by Octaviano Glow [phone]) on 11/04/2019 8:56:54 PM   Radiology DG Chest 2  View  Result Date: 11/04/2019 CLINICAL DATA:  Right-sided chest pain since fall 3 days ago. EXAM: CHEST - 2 VIEW COMPARISON:  None. FINDINGS: Left chest wall pacemaker. Normal heart size. Enlarged main pulmonary artery. Normal pulmonary vascularity. 10 mm spiculated nodule in the right upper lobe. No focal consolidation, pleural effusion, or pneumothorax. Slight irregularity of the right lateral seventh rib. Otherwise, no acute osseous abnormality. IMPRESSION: 1. Possible nondisplaced fracture of the right lateral seventh rib. 2. 10 mm spiculated nodule in the right upper lobe. Recommend non-emergent non-contrast chest CT for further evaluation. Electronically Signed   By: Titus Dubin M.D.   On: 11/04/2019 17:38    Procedures Procedures (including critical care time)  Medications Ordered in ED Medications - No data to display  ED  Course  I have reviewed the triage vital signs and the nursing notes.  Pertinent labs & imaging results that were available during my care of the patient were reviewed by me and considered in my medical decision making (see chart for details).  54 yo male here with right sided rib pain and tenderness after a mechanical fall a few days ago, found to have a rib fracture nondisplaced on xray imaging.   ECG personally reviewed and shows NSR per my interpretation.  Patient has been compliant with all of his meds including Genvoya for HIV, but stopped taking his risperdol, which I recommended he resume.  We talked about the rib fracture, recovery period, breathing exercises, and tylenol for pain.  Okay for discharge.  Doubtful of PE, PTX, ACS at this time. Okay for discharge     Final Clinical Impression(s) / ED Diagnoses Final diagnoses:  Closed fracture of one rib of right side, initial encounter  Fall, initial encounter  Lung nodule    Rx / DC Orders ED Discharge Orders         Ordered    acetaminophen (TYLENOL) 325 MG tablet  Every 6 hours PRN      Discontinue  Reprint     11/04/19 2152           Wyvonnia Dusky, MD 11/05/19 0017

## 2019-11-04 NOTE — Discharge Instructions (Addendum)
You were diagnosed with a rib fracture (broken rib) and a nodule in your right lung.  The rib fracture can take 4-6 weeks to heal.  At home you can take tylenol 650 mg ( 2 pills) every 6 hours for pain.  You can also take your normal medicine, including your aspirin.  If your pain is very severe, you can take ONE motrin (ibuprofen) pill per day, but not more.  We talked about the lung nodule on your xray.  You have a 1 cm nodule in your right upper lung. Your doctor should order you a CT scan of your lungs to look at this nodule in the next few weeks.  It may be an early sign of lung cancer.  Please call your doctor's office on Monday to talk about this.  Please try to continue quitting smoking.   Finally, I would recommend that you start taking your risperdol again at bedtime.  You may need this medicine for your mental health.

## 2019-11-15 ENCOUNTER — Encounter: Payer: Self-pay | Admitting: Nurse Practitioner

## 2019-11-15 ENCOUNTER — Other Ambulatory Visit: Payer: Self-pay

## 2019-11-15 ENCOUNTER — Ambulatory Visit: Payer: Self-pay

## 2019-11-15 ENCOUNTER — Ambulatory Visit: Payer: Self-pay | Attending: Nurse Practitioner | Admitting: Nurse Practitioner

## 2019-11-15 VITALS — Ht 67.0 in | Wt 145.0 lb

## 2019-11-15 DIAGNOSIS — I10 Essential (primary) hypertension: Secondary | ICD-10-CM

## 2019-11-15 DIAGNOSIS — E559 Vitamin D deficiency, unspecified: Secondary | ICD-10-CM

## 2019-11-15 DIAGNOSIS — Z131 Encounter for screening for diabetes mellitus: Secondary | ICD-10-CM

## 2019-11-15 DIAGNOSIS — Z7689 Persons encountering health services in other specified circumstances: Secondary | ICD-10-CM

## 2019-11-15 DIAGNOSIS — Z1211 Encounter for screening for malignant neoplasm of colon: Secondary | ICD-10-CM

## 2019-11-15 DIAGNOSIS — D696 Thrombocytopenia, unspecified: Secondary | ICD-10-CM

## 2019-11-15 NOTE — Progress Notes (Signed)
Virtual Visit via Telephone Note Due to national recommendations of social distancing due to [place] 19, telehealth visit is felt to be most appropriate for this patient at this time.  I discussed the limitations, risks, security and privacy concerns of performing an evaluation and management service by telephone and the availability of in person appointments. I also discussed with the patient that there may be a patient responsible charge related to this service. The patient expressed understanding and agreed to proceed.    I connected with Mike Holland on 11/15/19  at   8:50 AM EDT  EDT by telephone and verified that I am speaking with the correct person using two identifiers.   Consent I discussed the limitations, risks, security and privacy concerns of performing an evaluation and management service by telephone and the availability of in person appointments. I also discussed with the patient that there may be a patient responsible charge related to this service. The patient expressed understanding and agreed to proceed.   Location of Patient: Private  Residence    Location of Provider: [place] and [employer] Office    Persons participating in Telemedicine visit: Mike Rankins FNP-BC [place]    History of Present Illness: Telemedicine visit for: Establish Care Past medical history of HIV (being followed by ID), thrombocytopenia, hypertension, PPM, depression and tobacco use.  Essential Hypertension Well controlled.  He does not have a blood pressure device so he does not monitor his blood pressure at home.  He endorses medication adherence taking metoprolol 25 mg daily and amlodipine 10 mg daily. Denies chest pain, shortness of breath, palpitations, lightheadedness, dizziness, headaches or BLE edema.  BP Readings from Last 3 Encounters:  11/04/19 114/89  09/20/19 113/74  08/19/19 102/68   Thrombocytopenia He has been evaluated by  hematology/oncology earlier this year and was instructed to return in 3 months however he has been lost to follow-up.  There was a strong suspicion at that time that his HIV could possibly have been contributing to his thrombocytopenia as his CD4 count was noted to be less than 200.  Over the past several months he endorses adherence with his HIV medications.  We will recheck CBC today. Past Medical History:  Diagnosis Date  . Depression   . HIV (human immunodeficiency virus infection) ([place])   . Hypertension   . Pacemaker     Past Surgical History:  Procedure Laterality Date  . PACEMAKER PLACEMENT      Family History  Problem Relation Age of Onset  . Heart disease Mother   . Heart disease Father     Social History   Socioeconomic History  . Marital status: Single    Spouse name: Not on file  . Number of children: 1  . Years of education: 77  . Highest education level: Not on file  Occupational History  . Occupation: Disability    Comment: Multiple reasons  Tobacco Use  . Smoking status: Former Smoker    Types: Cigars  . Smokeless tobacco: Never Used  . Tobacco comment: 1-2 black and milds per day   Vaping Use  . Vaping Use: Never used  Substance and Sexual Activity  . Alcohol use: Not Currently    Comment: holidays   . Drug use: Not Currently    Types: Marijuana  . Sexual activity: Not Currently    Partners: Male    Birth control/protection: Condom    Comment: declined condoms  Other Topics Concern  . Not  on file  Social History Narrative  . Not on file   Social Determinants of Health   Financial Resource Strain:   . Difficulty of Paying Living Expenses:   Food Insecurity:   . Worried About Charity fundraiser in the Last Year:   . Arboriculturist in the Last Year:   Transportation Needs:   . Film/video editor (Medical):   Marland Kitchen Lack of Transportation (Non-Medical):   Physical Activity:   . Days of Exercise per Week:   . Minutes of Exercise per Session:    Stress:   . Feeling of Stress :   Social Connections:   . Frequency of Communication with Friends and Family:   . Frequency of Social Gatherings with Friends and Family:   . Attends Religious Services:   . Active Member of Clubs or Organizations:   . Attends Archivist Meetings:   Marland Kitchen Marital Status:      Observations/Objective: Awake, alert and oriented x 3   Review of Systems  Constitutional: Negative for fever, malaise/fatigue and weight loss.  HENT: Negative.  Negative for nosebleeds.   Eyes: Negative.  Negative for blurred vision, double vision and photophobia.  Respiratory: Negative.  Negative for cough and shortness of breath.   Cardiovascular: Negative.  Negative for chest pain, palpitations and leg swelling.  Gastrointestinal: Negative.  Negative for heartburn, nausea and vomiting.  Musculoskeletal: Negative.  Negative for myalgias.  Neurological: Negative.  Negative for dizziness, focal weakness, seizures and headaches.  Psychiatric/Behavioral: Negative.  Negative for suicidal ideas.    Assessment and Plan: Javari was seen today for new patient (initial visit).  Diagnoses and all orders for this visit:  Encounter to establish care -     Hemoglobin A1c -     CMP14+EGFR -     CBC -     Lipid panel -     VITAMIN D 25 Hydroxy (Vit-D Deficiency, Fractures) -     CMP14+EGFR  Essential hypertension Continue amlodipine and metoprolol as prescribed.  Thrombocytopenia (HCC) CBC pending  Colon cancer screening -     Fecal occult blood, imunochemical([facility])     Follow Up Instructions Return for Physical ONLY no labs.     I discussed the assessment and treatment plan with the patient. The patient was provided an opportunity to ask questions and all were answered. The patient agreed with the plan and demonstrated an understanding of the instructions.   The patient was advised to call back or seek an in-person evaluation if the symptoms worsen  or if the condition fails to improve as anticipated.  I provided 19 minutes of non-face-to-face time during this encounter including median intraservice time, reviewing previous notes, labs, imaging, medications and explaining diagnosis and management.  Gildardo Pounds, FNP-BC

## 2019-11-16 ENCOUNTER — Other Ambulatory Visit: Payer: Self-pay | Admitting: Nurse Practitioner

## 2019-11-16 DIAGNOSIS — Z130 Encounter for screening for diseases of the blood and blood-forming organs and certain disorders involving the immune mechanism: Secondary | ICD-10-CM

## 2019-11-16 DIAGNOSIS — D649 Anemia, unspecified: Secondary | ICD-10-CM

## 2019-11-16 DIAGNOSIS — K8020 Calculus of gallbladder without cholecystitis without obstruction: Secondary | ICD-10-CM

## 2019-11-16 DIAGNOSIS — R748 Abnormal levels of other serum enzymes: Secondary | ICD-10-CM

## 2019-11-16 LAB — CMP14+EGFR
ALT: 13 IU/L (ref 0–44)
AST: 20 IU/L (ref 0–40)
Albumin/Globulin Ratio: 0.9 — ABNORMAL LOW (ref 1.2–2.2)
Albumin: 3.4 g/dL — ABNORMAL LOW (ref 3.8–4.9)
Alkaline Phosphatase: 271 IU/L — ABNORMAL HIGH (ref 48–121)
BUN/Creatinine Ratio: 16 (ref 9–20)
BUN: 13 mg/dL (ref 6–24)
Bilirubin Total: 0.3 mg/dL (ref 0.0–1.2)
CO2: 27 mmol/L (ref 20–29)
Calcium: 9.6 mg/dL (ref 8.7–10.2)
Chloride: 97 mmol/L (ref 96–106)
Creatinine, Ser: 0.8 mg/dL (ref 0.76–1.27)
GFR calc Af Amer: 118 mL/min/{1.73_m2} (ref >59)
GFR calc non Af Amer: 102 mL/min/{1.73_m2} (ref >59)
Globulin, Total: 4 g/dL (ref 1.5–4.5)
Glucose: 109 mg/dL — ABNORMAL HIGH (ref 65–99)
Potassium: 4.2 mmol/L (ref 3.5–5.2)
Sodium: 139 mmol/L (ref 134–144)
Total Protein: 7.4 g/dL (ref 6.0–8.5)

## 2019-11-16 LAB — CBC
Hematocrit: 33.3 % — ABNORMAL LOW (ref 37.5–51.0)
Hemoglobin: 11.2 g/dL — ABNORMAL LOW (ref 13.0–17.7)
MCH: 30.8 pg (ref 26.6–33.0)
MCHC: 33.6 g/dL (ref 31.5–35.7)
MCV: 92 fL (ref 79–97)
Platelets: 174 10*3/uL (ref 150–450)
RBC: 3.64 x10E6/uL — ABNORMAL LOW (ref 4.14–5.80)
RDW: 12.4 % (ref 11.6–15.4)
WBC: 7.9 10*3/uL (ref 3.4–10.8)

## 2019-11-16 LAB — HEMOGLOBIN A1C
Est. average glucose Bld gHb Est-mCnc: 128 mg/dL
Hgb A1c MFr Bld: 6.1 % — ABNORMAL HIGH (ref 4.8–5.6)

## 2019-11-16 LAB — LIPID PANEL
Chol/HDL Ratio: 8.4 ratio — ABNORMAL HIGH (ref 0.0–5.0)
Cholesterol, Total: 184 mg/dL (ref 100–199)
HDL: 22 mg/dL — ABNORMAL LOW (ref >39)
LDL Chol Calc (NIH): 113 mg/dL — ABNORMAL HIGH (ref 0–99)
Triglycerides: 278 mg/dL — ABNORMAL HIGH (ref 0–149)
VLDL Cholesterol Cal: 49 mg/dL — ABNORMAL HIGH (ref 5–40)

## 2019-11-16 LAB — VITAMIN D 25 HYDROXY (VIT D DEFICIENCY, FRACTURES): Vit D, 25-Hydroxy: 34.3 ng/mL (ref 30.0–100.0)

## 2019-11-16 MED ORDER — ATORVASTATIN CALCIUM 20 MG PO TABS: 20.0000 mg | ORAL_TABLET | Freq: Every day | ORAL | 3 refills | Status: AC

## 2019-11-17 MED FILL — ATORVASTATIN CALCIUM 20 MG: 20 | 30 days supply | Qty: 30 | Fill #0

## 2019-11-23 LAB — FECAL OCCULT BLOOD, IMMUNOCHEMICAL: Fecal Occult Bld: NEGATIVE

## 2019-11-28 ENCOUNTER — Encounter (HOSPITAL_COMMUNITY): Payer: Self-pay

## 2019-11-28 ENCOUNTER — Emergency Department (HOSPITAL_COMMUNITY): Payer: Medicaid Other

## 2019-11-28 ENCOUNTER — Emergency Department (HOSPITAL_COMMUNITY)
Admission: EM | Admit: 2019-11-28 | Discharge: 2019-11-28 | Disposition: A | Discharge status: Home / Self Care | Payer: Medicaid Other | Attending: Emergency Medicine | Admitting: Emergency Medicine

## 2019-11-28 ENCOUNTER — Other Ambulatory Visit: Payer: Self-pay

## 2019-11-28 DIAGNOSIS — M79605 Pain in left leg: Secondary | ICD-10-CM | POA: Diagnosis present

## 2019-11-28 DIAGNOSIS — Z950 Presence of cardiac pacemaker: Secondary | ICD-10-CM | POA: Diagnosis not present

## 2019-11-28 DIAGNOSIS — M5431 Sciatica, right side: Secondary | ICD-10-CM | POA: Diagnosis not present

## 2019-11-28 DIAGNOSIS — M5136 Other intervertebral disc degeneration, lumbar region: Secondary | ICD-10-CM | POA: Diagnosis not present

## 2019-11-28 DIAGNOSIS — Z87891 Personal history of nicotine dependence: Secondary | ICD-10-CM | POA: Diagnosis not present

## 2019-11-28 DIAGNOSIS — I10 Essential (primary) hypertension: Secondary | ICD-10-CM | POA: Diagnosis not present

## 2019-11-28 DIAGNOSIS — B20 Human immunodeficiency virus [HIV] disease: Secondary | ICD-10-CM | POA: Insufficient documentation

## 2019-11-28 DIAGNOSIS — M5432 Sciatica, left side: Secondary | ICD-10-CM | POA: Insufficient documentation

## 2019-11-28 DIAGNOSIS — Z79899 Other long term (current) drug therapy: Secondary | ICD-10-CM | POA: Insufficient documentation

## 2019-11-28 MED ORDER — METHOCARBAMOL 500 MG PO TABS: 500.0000 mg | ORAL_TABLET | Freq: Two times a day (BID) | ORAL | 0 refills | Status: AC

## 2019-11-28 MED ORDER — PREDNISONE 10 MG (21) PO TBPK: ORAL_TABLET | Freq: Every day | ORAL | 0 refills | Status: AC

## 2019-11-28 NOTE — ED Notes (Signed)
Patient verbalizes understanding of discharge instructions. Opportunity for questioning and answers were provided. Armband removed by staff, pt discharged from ED.  

## 2019-11-28 NOTE — Discharge Instructions (Addendum)
Take Robaxin as prescribed as needed for back pain. Take prednisone as prescribed and complete the full course. Follow-up with your primary care provider, call tomorrow to schedule an appointment.

## 2019-11-28 NOTE — ED Triage Notes (Signed)
Pt from home with ems c.o left leg pain for the past 3 days, pt fell a few weeks ago but no fall this week. Pt ambulatory with cane today, took an extra strength tylenol with improvement of pain.

## 2019-11-28 NOTE — ED Provider Notes (Signed)
[facility] EMERGENCY DEPARTMENT Provider Note   CSN: [id number] Arrival date & time: 11/28/19  9767     History Chief Complaint  Patient presents with  . Leg Pain    MAXINE HUYNH is a 54 y.o. male.  54 year old male with complaint of bilateral leg pain, left worse than right but pain location alternates. Pain located posterior thighs, varies in intensity, described as "just hurts," does feel like cramping. Occurs with activity and while at rest. Patient is taking leftover Tylenol ES without relief. Reports numbness/tingling in feet with the pain. Denies back pain, fevers, chills, weakness. No pain at this time, had pain in the left leg about 5 minutes ago. Patient had a fall 3 weeks ago, no falls since that time. No other complaints or concerns.         Past Medical History:  Diagnosis Date  . Depression   . HIV (human immunodeficiency virus infection) ([place])   . Hypertension   . Pacemaker     Patient Active Problem List   Diagnosis Date Noted  . Healthcare maintenance 08/19/2019  . HIV disease ([place]) 06/24/2019  . Essential hypertension 06/24/2019  . Depression 06/24/2019    Past Surgical History:  Procedure Laterality Date  . PACEMAKER PLACEMENT         Family History  Problem Relation Age of Onset  . Heart disease Mother   . Heart disease Father     Social History   Tobacco Use  . Smoking status: Former Smoker    Types: Cigars  . Smokeless tobacco: Never Used  . Tobacco comment: 1-2 black and milds per day   Vaping Use  . Vaping Use: Never used  Substance Use Topics  . Alcohol use: Not Currently    Comment: holidays   . Drug use: Not Currently    Types: Marijuana    Home Medications Prior to Admission medications   Medication Sig Start Date End Date Taking? Authorizing Provider  acetaminophen (TYLENOL) 325 MG tablet Take 2 tablets (650 mg total) by mouth every 6 (six) hours as needed for up to 30 doses for mild pain or  moderate pain. 11/04/19   Wyvonnia Dusky, MD  acetaminophen (TYLENOL) 500 MG tablet Take 1,000 mg by mouth every 6 (six) hours as needed for moderate pain.    [provider]  amLODipine (NORVASC) 10 MG tablet TAKE 1 TABLET(10 MG) BY MOUTH DAILY 10/03/19   Golden Circle, FNP  aspirin (ASPIRIN LOW DOSE) 81 MG EC tablet TAKE 1 TABLET(81 MG) BY MOUTH EVERY EVENING 10/03/19   Golden Circle, FNP  atorvastatin (LIPITOR) 20 MG tablet Take 1 tablet (20 mg total) by mouth daily. 11/16/19   Gildardo Pounds, NP  Cholecalciferol (VITAMIN D3) 25 MCG (1000 UT) CAPS Take 1 capsule (1,000 Units total) by mouth daily. 10/03/19   Golden Circle, FNP  elvitegravir-cobicistat-emtricitabine-tenofovir (GENVOYA) 150-150-200-10 MG TABS tablet Take 1 tablet by mouth every evening. 08/19/19   Golden Circle, FNP  folic acid (FOLVITE) 1 MG tablet TAKE 1 TABLET(1 MG) BY MOUTH EVERY EVENING 10/03/19   Golden Circle, FNP  methocarbamol (ROBAXIN) 500 MG tablet Take 1 tablet (500 mg total) by mouth 2 (two) times daily. 11/28/19   Tacy Learn, PA-C  metoprolol succinate (TOPROL-XL) 25 MG 24 hr tablet TAKE 1 TABLET(25 MG) BY MOUTH EVERY EVENING 10/03/19   Golden Circle, FNP  predniSONE (STERAPRED UNI-PAK 21 TAB) 10 MG (21) TBPK tablet  Take by mouth daily. Take 6 tabs by mouth daily  for 2 days, then 5 tabs for 2 days, then 4 tabs for 2 days, then 3 tabs for 2 days, 2 tabs for 2 days, then 1 tab by mouth daily for 2 days 11/28/19   Suella Broad A, PA-C  risperiDONE (RISPERDAL) 2 MG tablet TAKE 1 TABLET(2 MG) BY MOUTH AT BEDTIME 10/03/19   Golden Circle, FNP  valACYclovir (VALTREX) 500 MG tablet Take 1 tablet (500 mg total) by mouth every evening. Patient not taking: Reported on 06/24/2019 05/02/19   Isla Pence, MD  Vitamin D, Ergocalciferol, (DRISDOL) 1.25 MG (50000 UT) CAPS capsule Take 1 capsule (50,000 Units total) by mouth every Wednesday. Patient not taking: Reported on 06/24/2019 05/04/19    Isla Pence, MD    Allergies    Patient has no known allergies.  Review of Systems   Review of Systems  Constitutional: Negative for fever.  Gastrointestinal: Negative for abdominal pain.  Genitourinary: Negative for difficulty urinating.  Musculoskeletal: Positive for myalgias. Negative for back pain.  Skin: Negative for color change, rash and wound.  Allergic/Immunologic: Positive for immunocompromised state.  Neurological: Negative for weakness and numbness.  Psychiatric/Behavioral: Negative for confusion.  All other systems reviewed and are negative.   Physical Exam Updated Vital Signs BP 126/79 (BP Location: Right Arm)   Pulse (!) 110   Temp 99.2 F (37.3 C) (Oral)   Resp 15   SpO2 97%   Physical Exam Vitals and nursing note reviewed.  Constitutional:      General: He is not in acute distress.    Appearance: He is well-developed. He is not diaphoretic.  HENT:     Head: Normocephalic and atraumatic.  Cardiovascular:     Pulses: Normal pulses.  Pulmonary:     Effort: Pulmonary effort is normal.  Musculoskeletal:        General: Tenderness present.     Lumbar back: Tenderness present. No bony tenderness.       Back:     Right lower leg: No edema.     Left lower leg: No edema.     Comments: Tenderness on palpation to right SI joint no midline or bony tenderness. Straight leg raise negative. Leg strength symmetric, able to straight leg raise both legs without difficulty. Strong pedal pulses present, reflexes symmetric.  Skin:    General: Skin is warm and dry.     Capillary Refill: Capillary refill takes less than 2 seconds.     Findings: No erythema or rash.  Neurological:     Mental Status: He is alert and oriented to person, place, and time.  Psychiatric:        Behavior: Behavior normal.     ED Results / Procedures / Treatments   Labs (all labs ordered are listed, but only abnormal results are displayed) Labs Reviewed - No data to  display  EKG None  Radiology DG Lumbar Spine Complete  Result Date: 11/28/2019 CLINICAL DATA:  Fall 3 weeks ago with low back pain EXAM: LUMBAR SPINE - COMPLETE 4+ VIEW COMPARISON:  Chest examination from June of 2021 FINDINGS: No sign of fracture or malalignment in the lumbar spine. Vertebral body heights are maintained with mild disc space narrowing and anterior osteophyte formation greatest at L3-4 and L5-S1. Mild facet arthropathy is also demonstrated. IMPRESSION: 1. No signs of fracture or malalignment. 2. Mild degenerative disc disease in the lower lumbar spine. Electronically Signed   By: Jewel Baize.D.  On: 11/28/2019 11:37    Procedures Procedures (including critical care time)  Medications Ordered in ED Medications - No data to display  ED Course  I have reviewed the triage vital signs and the nursing notes.  Pertinent labs & imaging results that were available during my care of the patient were reviewed by me and considered in my medical decision making (see chart for details).  Clinical Course as of Nov 27 1324  Mon Jul 05, 50103  [id number] 54 year old male with complaint of pain to the backs of his legs for the past 3 days.  On exam is found to have tenderness in the right SI joint, straight leg raise negative, sensation, reflexes, pulses intact.  X-ray lumbar spine completed due to recent fall, shows degenerative changes, otherwise no acute pathology.  Patient is given course of prednisone with muscle relaxer and advised to follow-up with his PCP for recheck.   [LM]    Clinical Course User Index [LM] Roque Lias   MDM Rules/Calculators/A&P                          Final Clinical Impression(s) / ED Diagnoses Final diagnoses:  Degenerative disc disease, lumbar  Bilateral sciatica    Rx / DC Orders ED Discharge Orders         Ordered    methocarbamol (ROBAXIN) 500 MG tablet  2 times daily     Discontinue  Reprint     11/28/19 1150    predniSONE  (STERAPRED UNI-PAK 21 TAB) 10 MG (21) TBPK tablet  Daily     Discontinue  Reprint     11/28/19 1150           Tacy Learn, PA-C 11/28/19 [address], [place], DO 11/28/19 1402

## 2019-12-05 ENCOUNTER — Ambulatory Visit: Payer: Self-pay | Admitting: Family

## 2019-12-13 ENCOUNTER — Ambulatory Visit (INDEPENDENT_AMBULATORY_CARE_PROVIDER_SITE_OTHER): Payer: Self-pay | Admitting: Family

## 2019-12-13 ENCOUNTER — Encounter: Payer: Self-pay | Admitting: Family

## 2019-12-13 ENCOUNTER — Ambulatory Visit: Payer: Self-pay

## 2019-12-13 ENCOUNTER — Other Ambulatory Visit: Payer: Self-pay

## 2019-12-13 VITALS — BP 101/72 | HR 121 | Temp 97.5°F | Wt 122.0 lb

## 2019-12-13 DIAGNOSIS — Z0000 Encounter for general adult medical examination without abnormal findings: Secondary | ICD-10-CM

## 2019-12-13 DIAGNOSIS — B20 Human immunodeficiency virus [HIV] disease: Secondary | ICD-10-CM

## 2019-12-13 DIAGNOSIS — R634 Abnormal weight loss: Secondary | ICD-10-CM

## 2019-12-13 LAB — CBC
HCT: 32.9 % — ABNORMAL LOW (ref 38.5–50.0)
Hemoglobin: 11 g/dL — ABNORMAL LOW (ref 13.2–17.1)
MCH: 30.1 pg (ref 27.0–33.0)
MCHC: 33.4 g/dL (ref 32.0–36.0)
MCV: 89.9 fL (ref 80.0–100.0)
MPV: 10.6 fL (ref 7.5–12.5)
Platelets: 196 10*3/uL (ref 140–400)
RBC: 3.66 10*6/uL — ABNORMAL LOW (ref 4.20–5.80)
RDW: 13.3 % (ref 11.0–15.0)
WBC: 12.1 10*3/uL — ABNORMAL HIGH (ref 3.8–10.8)

## 2019-12-13 MED ORDER — BICTEGRAVIR-EMTRICITAB-TENOFOV 50-200-25 MG PO TABS: 1.0000 | ORAL_TABLET | Freq: Every day | ORAL | 11 refills | Status: AC

## 2019-12-13 MED ORDER — MEGESTROL ACETATE 625 MG/5ML PO SUSP: 625.0000 mg | Freq: Every day | ORAL | 0 refills | Status: AC

## 2019-12-13 NOTE — Assessment & Plan Note (Addendum)
Mike Holland has lost approximately 23 pounds over the course of the last 1-1/2 months equating to approximately 8% body weight loss. Unclear origin at present. Currently asymptomatic and has no problems obtaining food. Discussed eating small frequent meals and possible meal supplementation with boost. Check blood work today for protein malnutrition. Previous Cologuard negative. Certainly malignancy is a question although cannot rule out previous depression. Recommend possible CT scan given history of smoking. Will likely require additional work-up. We will try Megace to increase appetite. Plan for follow-up in 1 month or sooner if needed.

## 2019-12-13 NOTE — Assessment & Plan Note (Signed)
   Discussed/recommended Covid vaccination.  Discussed importance of safe sexual practice to reduce risk of STI.

## 2019-12-13 NOTE — Progress Notes (Signed)
Subjective:    Patient ID: Mike Holland, male    DOB: 07/24/1965, 54 y.o.   MRN: [id number]  Chief Complaint  Patient presents with  . Follow-up    B20     HPI:  Mike Holland is a 54 y.o. male with HIV disease who was last seen in the office on 09/20/2019 with improved adherence and tolerance to his ART regimen of Genvoya supplemented with Bactrim for OI prophylaxis. Instructed to discontinue Bactrim after 1 additional month. Since his last office visit he has experienced a fall resulting in a fractured rib. Here today for routine follow-up.  Mike Holland continues to take his Genvoya daily as prescribed with no adverse side effects or missed doses since his last office visit. He is feeling poorly today and has concerned about recent weight loss of approximately 20 pounds and decreased appetite. Denies fevers, chills, or night sweats. Current nutritional intake consists of chicken noodle soup and very limited solid foods. Denies nausea, vomiting, diarrhea, or constipation. Has access to food and declines assistance. No abdominal pain.  Mike Holland has no problems obtaining his medications from the pharmacy and remains covered through [employer]. Denies feelings of being down, depressed, or hopeless recently. No current recreational or illicit drug use, tobacco use, or alcohol consumption. Not currently sexually active.   No Known Allergies    Outpatient Medications Prior to Visit  Medication Sig Dispense Refill  . acetaminophen (TYLENOL) 325 MG tablet Take 2 tablets (650 mg total) by mouth every 6 (six) hours as needed for up to 30 doses for mild pain or moderate pain. 30 tablet 0  . acetaminophen (TYLENOL) 500 MG tablet Take 1,000 mg by mouth every 6 (six) hours as needed for moderate pain.    Marland Kitchen amLODipine (NORVASC) 10 MG tablet TAKE 1 TABLET(10 MG) BY MOUTH DAILY 30 tablet 3  . aspirin (ASPIRIN LOW DOSE) 81 MG EC tablet TAKE 1 TABLET(81 MG) BY MOUTH EVERY EVENING 30 tablet 3  .  atorvastatin (LIPITOR) 20 MG tablet Take 1 tablet (20 mg total) by mouth daily. 90 tablet 3  . folic acid (FOLVITE) 1 MG tablet TAKE 1 TABLET(1 MG) BY MOUTH EVERY EVENING 30 tablet 3  . methocarbamol (ROBAXIN) 500 MG tablet Take 1 tablet (500 mg total) by mouth 2 (two) times daily. 12 tablet 0  . metoprolol succinate (TOPROL-XL) 25 MG 24 hr tablet TAKE 1 TABLET(25 MG) BY MOUTH EVERY EVENING 30 tablet 3  . predniSONE (STERAPRED UNI-PAK 21 TAB) 10 MG (21) TBPK tablet Take by mouth daily. Take 6 tabs by mouth daily  for 2 days, then 5 tabs for 2 days, then 4 tabs for 2 days, then 3 tabs for 2 days, 2 tabs for 2 days, then 1 tab by mouth daily for 2 days 42 tablet 0  . risperiDONE (RISPERDAL) 2 MG tablet TAKE 1 TABLET(2 MG) BY MOUTH AT BEDTIME 30 tablet 3  . valACYclovir (VALTREX) 500 MG tablet Take 1 tablet (500 mg total) by mouth every evening. 30 tablet 0  . elvitegravir-cobicistat-emtricitabine-tenofovir (GENVOYA) 150-150-200-10 MG TABS tablet Take 1 tablet by mouth every evening. 30 tablet 3  . Cholecalciferol (VITAMIN D3) 25 MCG (1000 UT) CAPS Take 1 capsule (1,000 Units total) by mouth daily. (Patient not taking: Reported on 12/13/2019) 30 capsule 3  . Vitamin D, Ergocalciferol, (DRISDOL) 1.25 MG (50000 UT) CAPS capsule Take 1 capsule (50,000 Units total) by mouth every Wednesday. (Patient not taking: Reported on 12/13/2019) 5 capsule 0  No facility-administered medications prior to visit.     Past Medical History:  Diagnosis Date  . Depression   . HIV (human immunodeficiency virus infection) ([place])   . Hypertension   . Pacemaker      Past Surgical History:  Procedure Laterality Date  . PACEMAKER PLACEMENT       Review of Systems  Constitutional: Positive for appetite change. Negative for chills, fatigue, fever and unexpected weight change.  Eyes: Negative for visual disturbance.  Respiratory: Negative for cough, chest tightness, shortness of breath and wheezing.   Cardiovascular:  Negative for chest pain and leg swelling.  Gastrointestinal: Negative for abdominal pain, constipation, diarrhea, nausea and vomiting.  Genitourinary: Negative for dysuria, flank pain, frequency, genital sores, hematuria and urgency.  Skin: Negative for rash.  Allergic/Immunologic: Negative for immunocompromised state.  Neurological: Negative for dizziness and headaches.      Objective:    BP 101/72   Pulse (!) 121   Temp (!) 97.5 F (36.4 C) (Oral)   Wt 122 lb (55.3 kg)   BMI 19.11 kg/m  Nursing note and vital signs reviewed.  Wt Readings from Last 3 Encounters:  12/13/19 122 lb (55.3 kg)  11/15/19 145 lb (65.8 kg)  09/20/19 148 lb (67.1 kg)    Physical Exam Constitutional:      General: He is not in acute distress.    Appearance: He is well-developed.     Comments: Seated in the chair; pleasant; walker in front of him.  Cardiovascular:     Rate and Rhythm: Normal rate and regular rhythm.     Heart sounds: Normal heart sounds.  Pulmonary:     Effort: Pulmonary effort is normal.     Breath sounds: Normal breath sounds.  Skin:    General: Skin is warm and dry.  Neurological:     Mental Status: He is oriented to person, place, and time.  Psychiatric:        Behavior: Behavior normal.        Thought Content: Thought content normal.        Judgment: Judgment normal.      Depression screen [facility] 2/9 12/13/2019 11/15/2019 09/20/2019 08/19/2019 06/24/2019  Decreased Interest 0 0 0 0 0  Down, Depressed, Hopeless 0 0 0 0 0  PHQ - 2 Score 0 0 0 0 0  Altered sleeping - 0 - - -  Tired, decreased energy - 0 - - -  Change in appetite - 1 - - -  Feeling bad or failure about yourself  - 0 - - -  Trouble concentrating - 0 - - -  Moving slowly or fidgety/restless - 0 - - -  Suicidal thoughts - 0 - - -  PHQ-9 Score - 1 - - -       Assessment & Plan:    Patient Active Problem List   Diagnosis Date Noted  . Unintentional weight loss 12/13/2019  . Healthcare maintenance  08/19/2019  . HIV disease ([place]) 06/24/2019  . Essential hypertension 06/24/2019  . Depression 06/24/2019     Problem List Items Addressed This Visit      Other   HIV disease ([place]) - Primary    Mike Holland continues to have adequately controlled HIV disease with good adherence and tolerance to his ART regimen of Genvoya. No signs/symptoms of opportunistic infection or progressive HIV disease. He does have significant weight loss over the course of the last month of unclear origin. Check blood work today. Renew financial assistance.  Discontinue Genvoya and start [place]. Plan for follow-up in 1 month or sooner if needed.      Relevant Medications   bictegravir-emtricitabine-tenofovir AF (BIKTARVY) 50-200-25 MG TABS tablet   Other Relevant Orders   HIV-1 RNA quant-no reflex-bld   T-helper cell (CD4)- ([facility] only)   COMPLETE METABOLIC PANEL WITH GFR   CBC   COMPLETE METABOLIC PANEL WITH GFR   HIV-1 RNA quant-no reflex-bld   CBC   Healthcare maintenance     Discussed/recommended Covid vaccination.  Discussed importance of safe sexual practice to reduce risk of STI.      Unintentional weight loss    Mike Holland has lost approximately 23 pounds over the course of the last 1-1/2 months equating to approximately 8% body weight loss. Unclear origin at present. Currently asymptomatic and has no problems obtaining food. Discussed eating small frequent meals and possible meal supplementation with boost. Check blood work today for protein malnutrition. Previous Cologuard negative. Certainly malignancy is a question although cannot rule out previous depression. Recommend possible CT scan given history of smoking. Will likely require additional work-up. We will try Megace to increase appetite. Plan for follow-up in 1 month or sooner if needed.          I have discontinued Verlee Monte. Campoy's Genvoya. I am also having him start on megestrol and bictegravir-emtricitabine-tenofovir AF.  Additionally, I am having him maintain his valACYclovir, Vitamin D (Ergocalciferol), amLODipine, aspirin, Vitamin D3, folic acid, metoprolol succinate, risperiDONE, acetaminophen, acetaminophen, atorvastatin, methocarbamol, and predniSONE.   Meds ordered this encounter  Medications  . megestrol (MEGACE ES) 625 MG/5ML suspension    Sig: Take 5 mLs (625 mg total) by mouth daily.    Dispense:  150 mL    Refill:  0    Order Specific Question:   Supervising Provider    Answer:   Carlyle Basques [[id number]]  . bictegravir-emtricitabine-tenofovir AF (BIKTARVY) 50-200-25 MG TABS tablet    Sig: Take 1 tablet by mouth daily.    Dispense:  30 tablet    Refill:  11    Order Specific Question:   Supervising Provider    Answer:   Carlyle Basques [[id number]]     Follow-up: Return in about 1 month (around 01/13/2020), or if symptoms worsen or fail to improve.   Terri Piedra, MSN, FNP-C Nurse Practitioner [facility] for Infectious Disease [place] number: [phone]

## 2019-12-13 NOTE — Patient Instructions (Signed)
Nice to see you.  We will check your lab work today.  Change your Genvoya to [employer].   Start Megace temporarily to help increase appetite.  Renew financial  Assistance today.   Follow up with primary care for physical therapy.   Plan for follow up in 1 month or sooner if needed.  Have a great day and stay safe!

## 2019-12-13 NOTE — Assessment & Plan Note (Signed)
Mr. Mike Holland continues to have adequately controlled HIV disease with good adherence and tolerance to his ART regimen of Genvoya. No signs/symptoms of opportunistic infection or progressive HIV disease. He does have significant weight loss over the course of the last month of unclear origin. Check blood work today. Renew financial assistance. Discontinue Genvoya and start [place]. Plan for follow-up in 1 month or sooner if needed.

## 2019-12-14 LAB — T-HELPER CELL (CD4) - (RCID CLINIC ONLY)
CD4 % Helper T Cell: 9 % — ABNORMAL LOW (ref 33–65)
CD4 T Cell Abs: 217 /uL — ABNORMAL LOW (ref 400–1790)

## 2019-12-16 ENCOUNTER — Inpatient Hospital Stay (HOSPITAL_COMMUNITY)
Admission: EM | Admit: 2019-12-16 | Discharge: 2020-01-25 | DRG: 823 | Disposition: E | Payer: Medicaid Other | Attending: Internal Medicine | Admitting: Internal Medicine

## 2019-12-16 ENCOUNTER — Emergency Department (HOSPITAL_COMMUNITY): Payer: Medicaid Other

## 2019-12-16 ENCOUNTER — Encounter (HOSPITAL_COMMUNITY): Payer: Self-pay

## 2019-12-16 ENCOUNTER — Other Ambulatory Visit: Payer: Self-pay

## 2019-12-16 DIAGNOSIS — F329 Major depressive disorder, single episode, unspecified: Secondary | ICD-10-CM | POA: Diagnosis present

## 2019-12-16 DIAGNOSIS — J90 Pleural effusion, not elsewhere classified: Secondary | ICD-10-CM | POA: Diagnosis not present

## 2019-12-16 DIAGNOSIS — R64 Cachexia: Secondary | ICD-10-CM | POA: Diagnosis present

## 2019-12-16 DIAGNOSIS — Z515 Encounter for palliative care: Secondary | ICD-10-CM | POA: Diagnosis not present

## 2019-12-16 DIAGNOSIS — Z21 Asymptomatic human immunodeficiency virus [HIV] infection status: Secondary | ICD-10-CM | POA: Diagnosis present

## 2019-12-16 DIAGNOSIS — I69359 Hemiplegia and hemiparesis following cerebral infarction affecting unspecified side: Secondary | ICD-10-CM

## 2019-12-16 DIAGNOSIS — Z681 Body mass index (BMI) 19 or less, adult: Secondary | ICD-10-CM

## 2019-12-16 DIAGNOSIS — I469 Cardiac arrest, cause unspecified: Secondary | ICD-10-CM | POA: Diagnosis not present

## 2019-12-16 DIAGNOSIS — M8448XA Pathological fracture, other site, initial encounter for fracture: Secondary | ICD-10-CM

## 2019-12-16 DIAGNOSIS — Z7189 Other specified counseling: Secondary | ICD-10-CM

## 2019-12-16 DIAGNOSIS — F1210 Cannabis abuse, uncomplicated: Secondary | ICD-10-CM | POA: Diagnosis present

## 2019-12-16 DIAGNOSIS — E876 Hypokalemia: Secondary | ICD-10-CM | POA: Diagnosis present

## 2019-12-16 DIAGNOSIS — E785 Hyperlipidemia, unspecified: Secondary | ICD-10-CM | POA: Diagnosis present

## 2019-12-16 DIAGNOSIS — Z7982 Long term (current) use of aspirin: Secondary | ICD-10-CM

## 2019-12-16 DIAGNOSIS — R7303 Prediabetes: Secondary | ICD-10-CM | POA: Diagnosis present

## 2019-12-16 DIAGNOSIS — R4781 Slurred speech: Secondary | ICD-10-CM | POA: Diagnosis present

## 2019-12-16 DIAGNOSIS — R269 Unspecified abnormalities of gait and mobility: Secondary | ICD-10-CM

## 2019-12-16 DIAGNOSIS — K5909 Other constipation: Secondary | ICD-10-CM | POA: Diagnosis present

## 2019-12-16 DIAGNOSIS — Z66 Do not resuscitate: Secondary | ICD-10-CM | POA: Diagnosis not present

## 2019-12-16 DIAGNOSIS — F32A Depression, unspecified: Secondary | ICD-10-CM | POA: Diagnosis present

## 2019-12-16 DIAGNOSIS — M899 Disorder of bone, unspecified: Secondary | ICD-10-CM

## 2019-12-16 DIAGNOSIS — R0600 Dyspnea, unspecified: Secondary | ICD-10-CM

## 2019-12-16 DIAGNOSIS — B370 Candidal stomatitis: Secondary | ICD-10-CM | POA: Diagnosis present

## 2019-12-16 DIAGNOSIS — M4856XA Collapsed vertebra, not elsewhere classified, lumbar region, initial encounter for fracture: Secondary | ICD-10-CM | POA: Diagnosis present

## 2019-12-16 DIAGNOSIS — R634 Abnormal weight loss: Secondary | ICD-10-CM | POA: Diagnosis present

## 2019-12-16 DIAGNOSIS — M8458XA Pathological fracture in neoplastic disease, other specified site, initial encounter for fracture: Secondary | ICD-10-CM | POA: Diagnosis not present

## 2019-12-16 DIAGNOSIS — Z950 Presence of cardiac pacemaker: Secondary | ICD-10-CM | POA: Diagnosis not present

## 2019-12-16 DIAGNOSIS — R54 Age-related physical debility: Secondary | ICD-10-CM | POA: Diagnosis present

## 2019-12-16 DIAGNOSIS — Z87891 Personal history of nicotine dependence: Secondary | ICD-10-CM

## 2019-12-16 DIAGNOSIS — Z20822 Contact with and (suspected) exposure to covid-19: Secondary | ICD-10-CM | POA: Diagnosis not present

## 2019-12-16 DIAGNOSIS — R262 Difficulty in walking, not elsewhere classified: Secondary | ICD-10-CM | POA: Diagnosis present

## 2019-12-16 DIAGNOSIS — E43 Unspecified severe protein-calorie malnutrition: Secondary | ICD-10-CM | POA: Diagnosis present

## 2019-12-16 DIAGNOSIS — R627 Adult failure to thrive: Secondary | ICD-10-CM | POA: Diagnosis present

## 2019-12-16 DIAGNOSIS — K117 Disturbances of salivary secretion: Secondary | ICD-10-CM

## 2019-12-16 DIAGNOSIS — D696 Thrombocytopenia, unspecified: Secondary | ICD-10-CM | POA: Diagnosis not present

## 2019-12-16 DIAGNOSIS — C8510 Unspecified B-cell lymphoma, unspecified site: Secondary | ICD-10-CM | POA: Diagnosis present

## 2019-12-16 DIAGNOSIS — D61818 Other pancytopenia: Secondary | ICD-10-CM | POA: Diagnosis present

## 2019-12-16 DIAGNOSIS — C8374 Burkitt lymphoma, lymph nodes of axilla and upper limb: Principal | ICD-10-CM | POA: Diagnosis present

## 2019-12-16 DIAGNOSIS — I959 Hypotension, unspecified: Secondary | ICD-10-CM | POA: Diagnosis present

## 2019-12-16 DIAGNOSIS — C800 Disseminated malignant neoplasm, unspecified: Secondary | ICD-10-CM | POA: Diagnosis present

## 2019-12-16 DIAGNOSIS — J9601 Acute respiratory failure with hypoxia: Secondary | ICD-10-CM | POA: Diagnosis present

## 2019-12-16 DIAGNOSIS — Z79899 Other long term (current) drug therapy: Secondary | ICD-10-CM | POA: Diagnosis not present

## 2019-12-16 DIAGNOSIS — B20 Human immunodeficiency virus [HIV] disease: Secondary | ICD-10-CM | POA: Diagnosis present

## 2019-12-16 DIAGNOSIS — D649 Anemia, unspecified: Secondary | ICD-10-CM | POA: Diagnosis present

## 2019-12-16 DIAGNOSIS — D638 Anemia in other chronic diseases classified elsewhere: Secondary | ICD-10-CM | POA: Diagnosis present

## 2019-12-16 DIAGNOSIS — R0902 Hypoxemia: Secondary | ICD-10-CM

## 2019-12-16 DIAGNOSIS — C7951 Secondary malignant neoplasm of bone: Secondary | ICD-10-CM | POA: Diagnosis not present

## 2019-12-16 DIAGNOSIS — I10 Essential (primary) hypertension: Secondary | ICD-10-CM | POA: Diagnosis present

## 2019-12-16 DIAGNOSIS — I495 Sick sinus syndrome: Secondary | ICD-10-CM | POA: Diagnosis present

## 2019-12-16 DIAGNOSIS — E860 Dehydration: Secondary | ICD-10-CM | POA: Diagnosis present

## 2019-12-16 DIAGNOSIS — R0602 Shortness of breath: Secondary | ICD-10-CM

## 2019-12-16 DIAGNOSIS — R591 Generalized enlarged lymph nodes: Secondary | ICD-10-CM

## 2019-12-16 LAB — COMPREHENSIVE METABOLIC PANEL
ALT: 13 U/L (ref 0–44)
AST: 29 U/L (ref 15–41)
Albumin: 2.2 g/dL — ABNORMAL LOW (ref 3.5–5.0)
Alkaline Phosphatase: 183 U/L — ABNORMAL HIGH (ref 38–126)
Anion gap: 17 — ABNORMAL HIGH (ref 5–15)
BUN: 7 mg/dL (ref 6–20)
CO2: 24 mmol/L (ref 22–32)
Calcium: 10.6 mg/dL — ABNORMAL HIGH (ref 8.9–10.3)
Chloride: 93 mmol/L — ABNORMAL LOW (ref 98–111)
Creatinine, Ser: 0.71 mg/dL (ref 0.61–1.24)
GFR calc Af Amer: >60 mL/min (ref >60)
GFR calc non Af Amer: >60 mL/min (ref >60)
Glucose, Bld: 113 mg/dL — ABNORMAL HIGH (ref 70–99)
Potassium: 2.7 mmol/L — CL (ref 3.5–5.1)
Sodium: 134 mmol/L — ABNORMAL LOW (ref 135–145)
Total Bilirubin: 0.9 mg/dL (ref 0.3–1.2)
Total Protein: 6.8 g/dL (ref 6.5–8.1)

## 2019-12-16 LAB — PHOSPHORUS: Phosphorus: 3.2 mg/dL (ref 2.5–4.6)

## 2019-12-16 LAB — IRON AND TIBC
Iron: 65 ug/dL (ref 45–182)
Saturation Ratios: 36 % (ref 17.9–39.5)
TIBC: 181 ug/dL — ABNORMAL LOW (ref 250–450)
UIBC: 116 ug/dL

## 2019-12-16 LAB — MAGNESIUM: Magnesium: 1.3 mg/dL — ABNORMAL LOW (ref 1.7–2.4)

## 2019-12-16 LAB — CBC
HCT: 31.3 % — ABNORMAL LOW (ref 39.0–52.0)
HCT: 28.9 % — ABNORMAL LOW (ref 39.0–52.0)
Hemoglobin: 9.9 g/dL — ABNORMAL LOW (ref 13.0–17.0)
Hemoglobin: 9.2 g/dL — ABNORMAL LOW (ref 13.0–17.0)
MCH: 29.2 pg (ref 26.0–34.0)
MCH: 29.3 pg (ref 26.0–34.0)
MCHC: 31.6 g/dL (ref 30.0–36.0)
MCHC: 31.8 g/dL (ref 30.0–36.0)
MCV: 92.3 fL (ref 80.0–100.0)
MCV: 92 fL (ref 80.0–100.0)
Platelets: 156 10*3/uL (ref 150–400)
Platelets: 140 10*3/uL — ABNORMAL LOW (ref 150–400)
RBC: 3.39 MIL/uL — ABNORMAL LOW (ref 4.22–5.81)
RBC: 3.14 MIL/uL — ABNORMAL LOW (ref 4.22–5.81)
RDW: 13.8 % (ref 11.5–15.5)
RDW: 13.8 % (ref 11.5–15.5)
WBC: 12.1 10*3/uL — ABNORMAL HIGH (ref 4.0–10.5)
WBC: 10.4 10*3/uL (ref 4.0–10.5)
nRBC: 0.6 % — ABNORMAL HIGH (ref 0.0–0.2)
nRBC: 0.6 % — ABNORMAL HIGH (ref 0.0–0.2)

## 2019-12-16 LAB — URINALYSIS, ROUTINE W REFLEX MICROSCOPIC
Glucose, UA: NEGATIVE mg/dL
Hgb urine dipstick: NEGATIVE
Ketones, ur: NEGATIVE mg/dL
Leukocytes,Ua: NEGATIVE
Nitrite: NEGATIVE
Protein, ur: NEGATIVE mg/dL
Specific Gravity, Urine: 1.02 (ref 1.005–1.030)
pH: 6 (ref 5.0–8.0)

## 2019-12-16 LAB — COMPLETE METABOLIC PANEL WITH GFR
AG Ratio: 0.9 (calc) — ABNORMAL LOW (ref 1.0–2.5)
ALT: 12 U/L (ref 9–46)
AST: 23 U/L (ref 10–35)
Albumin: 3.3 g/dL — ABNORMAL LOW (ref 3.6–5.1)
Alkaline phosphatase (APISO): 236 U/L — ABNORMAL HIGH (ref 35–144)
BUN: 17 mg/dL (ref 7–25)
CO2: 26 mmol/L (ref 20–32)
Calcium: 9.9 mg/dL (ref 8.6–10.3)
Chloride: 88 mmol/L — ABNORMAL LOW (ref 98–110)
Creat: 0.76 mg/dL (ref 0.70–1.33)
GFR, Est African American: 121 mL/min/{1.73_m2} (ref >=60)
GFR, Est Non African American: 104 mL/min/{1.73_m2} (ref >=60)
Globulin: 3.8 g/dL (calc) — ABNORMAL HIGH (ref 1.9–3.7)
Glucose, Bld: 116 mg/dL — ABNORMAL HIGH (ref 65–99)
Potassium: 3.1 mmol/L — ABNORMAL LOW (ref 3.5–5.3)
Sodium: 132 mmol/L — ABNORMAL LOW (ref 135–146)
Total Bilirubin: 0.8 mg/dL (ref 0.2–1.2)
Total Protein: 7.1 g/dL (ref 6.1–8.1)

## 2019-12-16 LAB — DIFFERENTIAL
Abs Immature Granulocytes: 1.76 10*3/uL — ABNORMAL HIGH (ref 0.00–0.07)
Basophils Absolute: 0.1 10*3/uL (ref 0.0–0.1)
Basophils Relative: 1 %
Eosinophils Absolute: 0.2 10*3/uL (ref 0.0–0.5)
Eosinophils Relative: 1 %
Immature Granulocytes: 15 %
Lymphocytes Relative: 18 %
Lymphs Abs: 2.1 10*3/uL (ref 0.7–4.0)
Monocytes Absolute: 0.5 10*3/uL (ref 0.1–1.0)
Monocytes Relative: 5 %
Neutro Abs: 7.4 10*3/uL (ref 1.7–7.7)
Neutrophils Relative %: 60 %

## 2019-12-16 LAB — PROTIME-INR
INR: 1.4 — ABNORMAL HIGH (ref 0.8–1.2)
Prothrombin Time: 16.9 seconds — ABNORMAL HIGH (ref 11.4–15.2)

## 2019-12-16 LAB — FERRITIN: Ferritin: 7361 ng/mL — ABNORMAL HIGH (ref 24–336)

## 2019-12-16 LAB — HEMOGLOBIN A1C
Hgb A1c MFr Bld: 6.1 % — ABNORMAL HIGH (ref 4.8–5.6)
Mean Plasma Glucose: 128.37 mg/dL

## 2019-12-16 LAB — RAPID URINE DRUG SCREEN, HOSP PERFORMED
Amphetamines: NOT DETECTED
Barbiturates: NOT DETECTED
Benzodiazepines: NOT DETECTED
Cocaine: NOT DETECTED
Opiates: NOT DETECTED
Tetrahydrocannabinol: POSITIVE — AB

## 2019-12-16 LAB — CREATININE, SERUM
Creatinine, Ser: 0.58 mg/dL — ABNORMAL LOW (ref 0.61–1.24)
GFR calc Af Amer: >60 mL/min (ref >60)
GFR calc non Af Amer: >60 mL/min (ref >60)

## 2019-12-16 LAB — LIPID PANEL
Cholesterol: 140 mg/dL (ref 0–200)
HDL: 23 mg/dL — ABNORMAL LOW (ref >40)
LDL Cholesterol: 82 mg/dL (ref 0–99)
Total CHOL/HDL Ratio: 6.1 RATIO
Triglycerides: 176 mg/dL — ABNORMAL HIGH (ref <150)
VLDL: 35 mg/dL (ref 0–40)

## 2019-12-16 LAB — APTT: aPTT: 38 seconds — ABNORMAL HIGH (ref 24–36)

## 2019-12-16 LAB — SARS CORONAVIRUS 2 BY RT PCR (HOSPITAL ORDER, PERFORMED IN ~~LOC~~ HOSPITAL LAB): SARS Coronavirus 2: NEGATIVE

## 2019-12-16 LAB — HIV-1 RNA QUANT-NO REFLEX-BLD
HIV 1 RNA Quant: 87 copies/mL — ABNORMAL HIGH
HIV-1 RNA Quant, Log: 1.94 Log copies/mL — ABNORMAL HIGH

## 2019-12-16 LAB — ETHANOL: Alcohol, Ethyl (B): <10 mg/dL (ref <10)

## 2019-12-16 MED ORDER — HYDROMORPHONE HCL 1 MG/ML IJ SOLN
0.5000 mg | INTRAMUSCULAR | Status: DC | PRN
  Administered 2019-12-19 – 2019-12-20 (×2): 1 mg via INTRAVENOUS
  Filled 2019-12-16 (×2): qty 1

## 2019-12-16 MED ORDER — POTASSIUM CHLORIDE 10 MEQ/100ML IV SOLN
10.0000 meq | INTRAVENOUS | Status: AC
  Administered 2019-12-16 (×2): 10 meq via INTRAVENOUS
  Filled 2019-12-16 (×2): qty 100

## 2019-12-16 MED ORDER — ONDANSETRON HCL 4 MG PO TABS: 4.0000 mg | ORAL_TABLET | Freq: Four times a day (QID) | ORAL | Status: DC | PRN

## 2019-12-16 MED ORDER — FOLIC ACID 1 MG PO TABS
1.0000 mg | ORAL_TABLET | Freq: Every evening | ORAL | Status: DC
  Administered 2019-12-16 – 2019-12-24 (×9): 1 mg via ORAL
  Filled 2019-12-16 (×9): qty 1

## 2019-12-16 MED ORDER — BICTEGRAVIR-EMTRICITAB-TENOFOV 50-200-25 MG PO TABS
1.0000 | ORAL_TABLET | Freq: Every day | ORAL | Status: DC
  Administered 2019-12-17 – 2019-12-26 (×9): 1 via ORAL
  Filled 2019-12-16 (×11): qty 1

## 2019-12-16 MED ORDER — ACETAMINOPHEN 650 MG RE SUPP
650.0000 mg | Freq: Four times a day (QID) | RECTAL | Status: DC | PRN
  Filled 2019-12-16: qty 1

## 2019-12-16 MED ORDER — IOHEXOL 300 MG/ML  SOLN
100.0000 mL | Freq: Once | INTRAMUSCULAR | Status: AC | PRN
  Administered 2019-12-16: 100 mL via INTRAVENOUS

## 2019-12-16 MED ORDER — ATORVASTATIN CALCIUM 10 MG PO TABS
20.0000 mg | ORAL_TABLET | Freq: Every day | ORAL | Status: DC
  Administered 2019-12-16 – 2019-12-26 (×10): 20 mg via ORAL
  Filled 2019-12-16 (×11): qty 2

## 2019-12-16 MED ORDER — MEGESTROL ACETATE 400 MG/10ML PO SUSP
800.0000 mg | Freq: Every day | ORAL | Status: DC
  Administered 2019-12-16 – 2019-12-18 (×3): 800 mg via ORAL
  Filled 2019-12-16 (×4): qty 20

## 2019-12-16 MED ORDER — ONDANSETRON HCL 4 MG/2ML IJ SOLN: 4.0000 mg | Freq: Four times a day (QID) | INTRAMUSCULAR | Status: DC | PRN

## 2019-12-16 MED ORDER — MAGNESIUM SULFATE 2 GM/50ML IV SOLN
2.0000 g | Freq: Once | INTRAVENOUS | Status: AC
  Administered 2019-12-16: 2 g via INTRAVENOUS
  Filled 2019-12-16: qty 50

## 2019-12-16 MED ORDER — ALBUTEROL SULFATE (2.5 MG/3ML) 0.083% IN NEBU: 2.5000 mg | INHALATION_SOLUTION | RESPIRATORY_TRACT | Status: DC | PRN

## 2019-12-16 MED ORDER — ENOXAPARIN SODIUM 40 MG/0.4ML ~~LOC~~ SOLN
40.0000 mg | SUBCUTANEOUS | Status: DC
  Administered 2019-12-16 – 2019-12-17 (×2): 40 mg via SUBCUTANEOUS
  Filled 2019-12-16 (×2): qty 0.4

## 2019-12-16 MED ORDER — AMLODIPINE BESYLATE 10 MG PO TABS
10.0000 mg | ORAL_TABLET | Freq: Every day | ORAL | Status: DC
  Administered 2019-12-16 – 2019-12-26 (×9): 10 mg via ORAL
  Filled 2019-12-16: qty 1
  Filled 2019-12-16: qty 2
  Filled 2019-12-16 (×9): qty 1

## 2019-12-16 MED ORDER — ACETAMINOPHEN 325 MG PO TABS
650.0000 mg | ORAL_TABLET | Freq: Four times a day (QID) | ORAL | Status: DC | PRN
  Administered 2019-12-17 – 2019-12-23 (×4): 650 mg via ORAL
  Filled 2019-12-16 (×4): qty 2

## 2019-12-16 MED ORDER — RISPERIDONE 2 MG PO TABS
2.0000 mg | ORAL_TABLET | Freq: Every day | ORAL | Status: DC
  Administered 2019-12-17 – 2019-12-25 (×9): 2 mg via ORAL
  Filled 2019-12-16 (×11): qty 1

## 2019-12-16 MED ORDER — METOPROLOL SUCCINATE ER 25 MG PO TB24
25.0000 mg | ORAL_TABLET | Freq: Every evening | ORAL | Status: DC
  Administered 2019-12-16 – 2019-12-19 (×4): 25 mg via ORAL
  Filled 2019-12-16 (×4): qty 1

## 2019-12-16 MED ORDER — NYSTATIN 100000 UNIT/ML MT SUSP
5.0000 mL | Freq: Four times a day (QID) | OROMUCOSAL | Status: DC
  Administered 2019-12-16 – 2019-12-26 (×33): 500000 [IU] via ORAL
  Filled 2019-12-16 (×34): qty 5

## 2019-12-16 NOTE — ED Triage Notes (Signed)
PT presents to ED via EMS from home with complain of slurred speech x 3 days with shuffling gate that began yesterday. 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 PT reports this has been ongoing problem "that happens every few months and gets better"

## 2019-12-16 NOTE — Consult Note (Signed)
Date of service: December 16, 2019 Patient Name: Mike Holland MRN: [id number] DOB: 1965-10-16 Reason for consult: "slurred speech and shuffling gait with difficulty walking"  Mike Holland is a 54 y.o. 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  Mike Holland reports that for the last 2 weeks he has been feeling off balance and he had a fall last week.  He endorses that over the last 2 days has been having diarrhea and then yesterday afternoon he noted that his speech was slurring.  He endorses significant history of weight loss.  He reports prior history of strokes his first stroke was about 4 years ago when he had left-sided weakness predominantly affecting his legs.  He had another stroke about 2 years ago which again resulted in left-sided weakness.  He endorses that he however, recovers fairly quickly from the strokes.  He reports he has not been able to eat or drink enough over the last few weeks.  He is so weak that he has to push himself up and feels wobbly on his feet.  He had a CTH without contrast was negative for a large territory infarct, no ICH.  He had imaging with chest x-ray with concern for a chest wall mass.  CT chest and CT abdomen pelvis demonstrated lymphadenopathy and findings concerning for potential lymphoma. ROS ROS: - CONSTITUTIONAL: + weight loss, no fever and chills. No night sweats. - HEENT: Denies changes in vision and hearing. - RESPIRATORY: Denies SOB and cough. - CV: Denies palpitations and CP. - GI: Denies abdominal pain, nausea, vomiting and + diarrhea. - GU: Denies dysuria and urinary frequency. - MSK: Denies myalgia and joint pain. - SKIN: Denies rash and pruritus. - NEUROLOGICAL: Denies headache and syncope. - PSYCHIATRIC: Denies recent changes in mood. Denies  anxiety and depression.  Past History Past Medical History:  Diagnosis Date  . Depression   . HIV (human immunodeficiency virus infection) ([place])   . Hypertension   . Pacemaker    Past Surgical History:  Procedure Laterality Date  . PACEMAKER PLACEMENT     Family History  Problem Relation Age of Onset  . Heart disease Mother   . Heart disease Father    Social History   Tobacco Use  . Smoking status: Former Smoker    Types: Cigars  . Smokeless tobacco: Never Used  . Tobacco comment: 1-2 black and milds per day   Vaping Use  . Vaping Use: Never used  Substance Use Topics  . Alcohol use: Not Currently    Comment: holidays   . Drug use: Not Currently    Types: Marijuana   No Known Allergies   (Not in a hospital admission)    Temp:  [98.3 F (36.8 C)] 98.3 F (36.8 C) (07/23 1130) Pulse Rate:  [105] 105 (07/23 1130) Resp:  [16] 16 (07/23 1130) BP: (115)/(74) 115/74 (07/23 1130) SpO2:  [98 %] 98 % (07/23 1130) Weight:  [55.3 kg] 55.3 kg (07/23 1130) Body mass index is 19.11 kg/m.  General: Laying comfortably in bed; in no acute distress. Appears cachectic. HENT: Normal oropharynx and mucosa. Normal external appearance of ears and nose Neck: Supple, no pain or tenderness. CV: RRR without murmur. No peripheral edema. Pulmonary: Symmetric chest rise. Normal respiratoy effort. Abdomen: positive bowel sounds, soft, non-tender. Normal liver  and spleen size. Ext: No cyanosis, edema, or deformity. Skin: No rash. Normal palpation of skin. Musculoskeletal: Normal digits and nails by inspection. No clubbing.  Neurologic Examination  MENTAL STATUS: AAOx3, memory intact, fund of knowledge appropriate LANG/SPEECH: Naming and repetition intact, fluent, follows 3-step commands CRANIAL NERVES: II: Pupils equal and reactive, no RAPD, no VF deficits III, IV, VI: EOM intact, no gaze preference or deviation, no nystagmus. V: normal sensation in V1, V2, and V3 segments  bilaterally VII: no asymmetry, no nasolabial fold flattening VIII: normal hearing to speech IX, X: normal palatal elevation, no uvular deviation XI: 5/5 head turn and 5/5 shoulder shrug bilaterally XII: midline tongue protrusion  MOTOR: 5/5 muscle power in Rt shoulder abductors/adductors, (5/5)elbow flexors/(4+/5)extensors, wrist flexors/extensors, finger abductors/adductors.  4+/5 in Rt hipflexors/extensors, (5/5)knee flexors/extensors, (5/5)ankle dorsiflexors and planter flexors.  5/5 muscle power in Lt shoulder abductors/adductors, elbow flexors/(4+/5) extensors, wrist flexors/extensors, finger abductors/adductors.  4/5 in Lt hipflexors/extensors,(4+/5) knee flexors/extensors, (5/5)ankle dorsiflexors and planter flexors.   REFLEXES: 2/4 throughout, bilateral flexor planter response, no Hoffman's, no clonus SENSORY: Normal to touch, pinprick, vibration in all limbs. No hemineglect, no extinction to double sided stimulation (visual & tactile) Coordination: Normal finger to nose and heel to shin, no tremor, no dysmetria. Mild left lower extremity ataxia.  Imaging/Labs/Diagnostics: CT head without contrast  IMPRESSION: 1. No acute abnormality. 2. Mild diffuse cerebral and cerebellar atrophy. 3. Mild chronic small vessel white matter ischemic changes in both cerebral hemispheres. 4. Old right corona radiata lacunar infarct. 5. Mild chronic bilateral maxillary sinusitis. 6. Right ethmoid sinus BB.    Impression and plan: Mike Holland is a 54 y.o. 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 Neuro exam with generalized weakness with Left leg weakness. CTH with no acute abnormality.  Given concerns for lymphoma on imaging and his history of HIV with low CD4 count, I really do believe that MRI brain  with and without contrast will be able to help [place] assess if he has any intracranial metastatic process or infectious process versus a new stroke or recrudescence of his prior stroke symptoms.  Given his mentation is intact at this time and he is not encephalopathic, my suspicion for an intracranial infection is somewhat low at this time.  We will start with just an MRI and if it shows anything concerning or if there is any worsening of his mental status, we will expand the work-up and consider LP at that time too. He is not having any episodes or events concerning for seizures at this time.  -Recommend MRI brain with and without contrast -Further work-up pending MRI brain. ______________________________________________________________________  Thank you for the opportunity to take part in the care of this patient. If you have any further questions, please contact the neurology consultation attending on call. Signed,   Pearl Pager Number [phone]

## 2019-12-16 NOTE — H&P (Signed)
History and Physical    Mike Holland QBH:[id number] DOB: September 10, 1965 DOA: 12/24/2019  PCP: Gildardo Pounds, NP  Patient coming from: Home  I have personally briefly reviewed patient's old medical records in [place]  Chief Complaint: Slurred speech, shuffling gait since 3 days, unintentional weight loss and generalized weakness since 1 month.  HPI: Mike Holland is a 54 y.o. male with medical history significant of HIV-diagnosed in 1996, CVA, hypertension, depression, hyperlipidemia, tachybradycardia syndrome?  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   Patient tells me that he has history of CVA.  He started having slurred speech and shuffling gait/difficulty in walking since 3 days.  He tells me that today his symptoms are getting better.  He denies headache, blurry vision, facial drop, seizures, loss of consciousness, lightheadedness, dizziness, numbness weakness tingling sensation in hands or feet, fall or head trauma.  Reports unintentional weight loss of 20 pounds in 1 month, generalized weakness, lethargic, decreased appetite.  He is followed by infectious disease outpatient for history of HIV.  He was seen by Dr. Belenda Cruise on 7/20-Genvoya was discontinued due to weight loss and was started on Biktarvy and megestrol.  He takes Valtrex for prophylaxis.  He denies fever, chills, nausea, vomiting, diarrhea, abdominal pain, chest pain, shortness of breath, palpitation, leg swelling, urinary or sleep changes.  He tells me that he has chronic constipation and has bowel movement every 4 to 5 days.  He lives alone at home.  Moved from [place] on November 2020.  Denies cigarette smoking, alcohol however uses marijuana 3 times daily to help with appetite.  He tells me pacemaker was placed in July 2018 due to irregular heart rate.  ED Course: Upon arrival to ED: Patient slightly tachycardic with  heart rate in 105, afebrile, maintaining oxygen saturation on room air, WBC: 12.1, H&H: 9.9/31.1, ethanol: WNL, CMP shows potassium of 2.7, calcium: 10.6, UDS positive for marijuana, UA negative for infection.  Magnesium: 1.3.  CT head came back negative for acute findings.  MRI was not obtained due to pacemaker.  Neurology consulted.  Chest x-ray, CT abdomen/CT chest was obtained which shows findings consistent with widespread neoplastic involvement-concerning for lymphoma.  EDP consulted oncology.  [facility] hospitalist consulted for admission.  Apparently: Patient's pacemaker is conditional for MRI and will need one of the trained MRI technician to change pacemaker settings into MRI safe mode however there is no staff available today that are trained to do this.  Review of Systems: As per HPI otherwise negative.    Past Medical History:  Diagnosis Date  . Depression   . HIV (human immunodeficiency virus infection) ([place])   . Hypertension   . Pacemaker     Past Surgical History:  Procedure Laterality Date  . PACEMAKER PLACEMENT       reports that he has quit smoking. His smoking use included cigars. He has never used smokeless tobacco. He reports previous alcohol use. He reports previous drug use. Drug: Marijuana.  No Known Allergies  Family History  Problem Relation Age of Onset  . Heart disease Mother   . Heart disease Father     Prior to Admission medications   Medication Sig Start Date End Date Taking? Authorizing Provider  acetaminophen (TYLENOL) 500 MG tablet Take 1,000 mg by mouth every 6 (six) hours as needed for moderate pain.   Yes [provider]  amLODipine (NORVASC) 10  MG tablet TAKE 1 TABLET(10 MG) BY MOUTH DAILY Patient taking differently: Take 10 mg by mouth daily.  10/03/19  Yes Golden Circle, FNP  atorvastatin (LIPITOR) 20 MG tablet Take 1 tablet (20 mg total) by mouth daily. 11/16/19  Yes Gildardo Pounds, NP  bictegravir-emtricitabine-tenofovir AF  (BIKTARVY) 50-200-25 MG TABS tablet Take 1 tablet by mouth daily. 12/13/19  Yes Golden Circle, FNP  folic acid (FOLVITE) 1 MG tablet TAKE 1 TABLET(1 MG) BY MOUTH EVERY EVENING Patient taking differently: Take 1 mg by mouth every evening.  10/03/19  Yes Golden Circle, FNP  megestrol (MEGACE ES) 625 MG/5ML suspension Take 5 mLs (625 mg total) by mouth daily. 12/13/19  Yes Golden Circle, FNP  methocarbamol (ROBAXIN) 500 MG tablet Take 1 tablet (500 mg total) by mouth 2 (two) times daily. 11/28/19  Yes Tacy Learn, PA-C  metoprolol succinate (TOPROL-XL) 25 MG 24 hr tablet TAKE 1 TABLET(25 MG) BY MOUTH EVERY EVENING Patient taking differently: Take 25 mg by mouth every evening.  10/03/19  Yes Golden Circle, FNP  risperiDONE (RISPERDAL) 2 MG tablet TAKE 1 TABLET(2 MG) BY MOUTH AT BEDTIME Patient taking differently: Take 2 mg by mouth at bedtime.  10/03/19  Yes Golden Circle, FNP  acetaminophen (TYLENOL) 325 MG tablet Take 2 tablets (650 mg total) by mouth every 6 (six) hours as needed for up to 30 doses for mild pain or moderate pain. Patient not taking: Reported on 12/14/2019 11/04/19   Wyvonnia Dusky, MD  aspirin (ASPIRIN LOW DOSE) 81 MG EC tablet TAKE 1 TABLET(81 MG) BY MOUTH EVERY EVENING Patient not taking: Reported on 12/07/2019 10/03/19   Golden Circle, FNP  Cholecalciferol (VITAMIN D3) 25 MCG (1000 UT) CAPS Take 1 capsule (1,000 Units total) by mouth daily. Patient not taking: Reported on 12/13/2019 10/03/19   Golden Circle, FNP  predniSONE (STERAPRED UNI-PAK 21 TAB) 10 MG (21) TBPK tablet Take by mouth daily. Take 6 tabs by mouth daily  for 2 days, then 5 tabs for 2 days, then 4 tabs for 2 days, then 3 tabs for 2 days, 2 tabs for 2 days, then 1 tab by mouth daily for 2 days Patient not taking: Reported on 12/08/2019 11/28/19   Tacy Learn, PA-C  valACYclovir (VALTREX) 500 MG tablet Take 1 tablet (500 mg total) by mouth every evening. Patient not taking: Reported on  12/14/2019 05/02/19   Isla Pence, MD  Vitamin D, Ergocalciferol, (DRISDOL) 1.25 MG (50000 UT) CAPS capsule Take 1 capsule (50,000 Units total) by mouth every Wednesday. Patient not taking: Reported on 12/13/2019 05/04/19   Isla Pence, MD    Physical Exam: Vitals:   12/14/2019 1130  BP: 115/74  Pulse: 105  Resp: 16  Temp: 98.3 F (36.8 C)  TempSrc: Oral  SpO2: 98%  Weight: 55.3 kg    Constitutional: NAD, calm, comfortable, alert and oriented x3, following commands, appears dehydrated.  Thin and lean. Eyes: PERRL, lids and conjunctivae normal ENMT: Mucous membranes are moist.  Candidiasis noted on tongue.  Posterior pharynx clear of any exudate or lesions.poor dentition. Neck: normal, supple, no masses, no thyromegaly Respiratory: clear to auscultation bilaterally, no wheezing, no crackles. Normal respiratory effort. No accessory muscle use.  Cardiovascular: Regular rate and rhythm, no murmurs / rubs / gallops. No extremity edema. 2+ pedal pulses. No carotid bruits.  Abdomen: no tenderness, no masses palpated. No hepatosplenomegaly. Bowel sounds positive.  Musculoskeletal: no clubbing / cyanosis. No joint deformity  upper and lower extremities. Good ROM, no contractures. Normal muscle tone.  Skin: no rashes, lesions, ulcers. No induration Neurologic: Slurred speech noted, CN 2-12 grossly intact. Sensation intact, DTR normal. Strength 5/5 in upper extremities, 4 out of 5 in lower extremities.  Psychiatric: Normal judgment and insight. Alert and oriented x 3. Normal mood.    Labs on Admission: I have personally reviewed following labs and imaging studies  CBC: Recent Labs  Lab 12/13/19 1052 12/09/2019 1135  WBC 12.1* 12.1*  NEUTROABS  --  7.4  HGB 11.0* 9.9*  HCT 32.9* 31.3*  MCV 89.9 92.3  PLT 196 242   Basic Metabolic Panel: Recent Labs  Lab 12/13/19 1053 11/28/2019 1135 12/02/2019 1300  NA 132* 134*  --   K 3.1* 2.7*  --   CL 88* 93*  --   CO2 26 24  --   GLUCOSE  116* 113*  --   BUN 17 7  --   CREATININE 0.76 0.71  --   CALCIUM 9.9 10.6*  --   MG  --   --  1.3*   GFR: Estimated Creatinine Clearance: 83.5 mL/min (by C-G formula based on SCr of 0.71 mg/dL). Liver Function Tests: Recent Labs  Lab 12/13/19 1053 12/11/2019 1135  AST 23 29  ALT 12 13  ALKPHOS  --  183*  BILITOT 0.8 0.9  PROT 7.1 6.8  ALBUMIN  --  2.2*   No results for input(s): LIPASE, AMYLASE in the last 168 hours. No results for input(s): AMMONIA in the last 168 hours. Coagulation Profile: Recent Labs  Lab 12/12/2019 1135  INR 1.4*   Cardiac Enzymes: No results for input(s): CKTOTAL, CKMB, CKMBINDEX, TROPONINI in the last 168 hours. BNP (last 3 results) No results for input(s): PROBNP in the last 8760 hours. HbA1C: No results for input(s): HGBA1C in the last 72 hours. CBG: No results for input(s): GLUCAP in the last 168 hours. Lipid Profile: No results for input(s): CHOL, HDL, LDLCALC, TRIG, CHOLHDL, LDLDIRECT in the last 72 hours. Thyroid Function Tests: No results for input(s): TSH, T4TOTAL, FREET4, T3FREE, THYROIDAB in the last 72 hours. Anemia Panel: No results for input(s): VITAMINB12, FOLATE, FERRITIN, TIBC, IRON, RETICCTPCT in the last 72 hours. Urine analysis:    Component Value Date/Time   COLORURINE AMBER (A) 11/24/2019 1254   APPEARANCEUR CLEAR 11/25/2019 1254   LABSPEC 1.020 12/18/2019 1254   PHURINE 6.0 12/20/2019 1254   GLUCOSEU NEGATIVE 12/03/2019 1254   HGBUR NEGATIVE 12/02/2019 1254   BILIRUBINUR SMALL (A) 12/07/2019 1254   KETONESUR NEGATIVE 12/12/2019 1254   PROTEINUR NEGATIVE 12/01/2019 1254   NITRITE NEGATIVE 12/04/2019 1254   LEUKOCYTESUR NEGATIVE 11/30/2019 1254    Radiological Exams on Admission: CT HEAD WO CONTRAST  Result Date: 12/10/2019 CLINICAL DATA:  Slurred speech for the past 3 days with gait disturbance since yesterday. History of 2 previous strokes. EXAM: CT HEAD WITHOUT CONTRAST TECHNIQUE: Contiguous axial images were  obtained from the base of the skull through the vertex without intravenous contrast. COMPARISON:  None. FINDINGS: Brain: Mildly enlarged ventricles and cortical sulci. Mild patchy white matter low density in both cerebral hemispheres. Old right corona radiata lacunar infarct. No intracranial hemorrhage, mass lesion or CT evidence of acute infarction. Vascular: No hyperdense vessel or unexpected calcification. Skull: Normal. Negative for fracture or focal lesion. Sinuses/Orbits: Mild to moderate left maxillary sinus mucosal thickening and minimal right maxillary sinus mucosal thickening. Right ethmoid sinus rounded metallic foreign body compatible with a BB. Other: None. IMPRESSION:  1. No acute abnormality. 2. Mild diffuse cerebral and cerebellar atrophy. 3. Mild chronic small vessel white matter ischemic changes in both cerebral hemispheres. 4. Old right corona radiata lacunar infarct. 5. Mild chronic bilateral maxillary sinusitis. 6. Right ethmoid sinus BB. Electronically Signed   By: Claudie Revering M.D.   On: 12/04/2019 12:26   CT Chest W Contrast  Result Date: 12/06/2019 CLINICAL DATA:  Follow-up abnormal chest x-ray suggesting chest wall mass EXAM: CT CHEST, ABDOMEN, AND PELVIS WITH CONTRAST TECHNIQUE: Multidetector CT imaging of the chest, abdomen and pelvis was performed following the standard protocol during bolus administration of intravenous contrast. CONTRAST:  139mL OMNIPAQUE IOHEXOL 300 MG/ML  SOLN COMPARISON:  Film from earlier in the same day. FINDINGS: CT CHEST FINDINGS Cardiovascular: Atherosclerotic calcifications of the thoracic aorta are noted. No aneurysmal dilatation or dissection is seen. No cardiac enlargement is noted. Pacing device is again seen. The pulmonary artery is enlarged without definitive central filling defect. Mediastinum/Nodes: Thoracic inlet is within normal limits. Scattered small hilar and mediastinal lymph nodes are noted. The esophagus appears within normal limits.  Bilateral axillary adenopathy is identified the largest of these on the right measures 12 mm. The largest of these on the left measures 11 mm. Lungs/Pleura: Emphysematous changes are seen. The left lung is clear. The emphysematous changes are more marked on the right. This contributes to the somewhat lucent area seen on recent chest x-ray. No sizable effusion is seen. Enhancing pleural base lesions are seen throughout the right chest adjacent to the anterior aspect of the right fourth rib as well as the posterolateral aspect of the right seventh rib and posterior aspect of the right ninth rib. Given the multiplicity of pleural based lesions, metastatic disease deserves consideration. Musculoskeletal: Pathologic fracture is noted involving the posterolateral aspect of the right seventh rib secondary to the pleural based mass. This corresponds with the finding seen on recent chest x-ray. No other definitive pathologic fracture is seen. The vertebral bodies show no definitive compression deformity. CT ABDOMEN PELVIS FINDINGS Hepatobiliary: No focal liver abnormality is seen. No gallstones, gallbladder wall thickening, or biliary dilatation. Pancreas: Unremarkable. No pancreatic ductal dilatation or surrounding inflammatory changes. Spleen: Normal in size without focal abnormality. Adrenals/Urinary Tract: Adrenal glands are within normal limits. Kidneys demonstrate a normal enhancement pattern bilaterally. No renal calculi or obstructive changes are seen. Kidneys demonstrate a normal excretion of contrast bilaterally on delayed images. The bladder is partially decompressed. Stomach/Bowel: Colon shows no obstructive or inflammatory changes. The appendix is well visualized and within normal limits. No small bowel or gastric abnormality is noted. Vascular/Lymphatic: Atherosclerotic calcifications of the abdominal aorta are seen without definitive aneurysmal dilatation. Considerable retroperitoneal adenopathy is noted in  the Peri aortic and intra-aortocaval region. The largest area is noted adjacent to aortic bifurcation measuring approximately 4.3 x 1.9 cm in greatest dimension. No significant inguinal adenopathy is noted. Bilateral common iliac adenopathy is seen as well. Reproductive: Prostate is unremarkable. Other: Minimal free fluid is noted which may be physiologic in nature. Musculoskeletal: Degenerative changes of lumbar spine are noted. There is a moth-eaten pattern identified within the right iliac bone adjacent to the sacroiliac joint as well as an area of bony destruction in the left half of the sacrum best seen on image number 105 of series 4. Soft tissue nodule is noted in the right buttock best seen on image number 85 of series 4 which measures 17 mm in greatest dimension. This may be related to the underlying diffuse process  although may be incidental. IMPRESSION: Constellation of findings as described above consistent with widespread neoplastic involvement. Given the lymphadenopathy in the possibility of lymphoma is felt to be most likely. Node biopsy would be helpful. Additionally PET-CT imaging is recommended for further evaluation. Electronically Signed   By: Inez Catalina M.D.   On: 12/18/2019 15:13   CT ABDOMEN PELVIS W CONTRAST  Result Date: 12/06/2019 CLINICAL DATA:  Follow-up abnormal chest x-ray suggesting chest wall mass EXAM: CT CHEST, ABDOMEN, AND PELVIS WITH CONTRAST TECHNIQUE: Multidetector CT imaging of the chest, abdomen and pelvis was performed following the standard protocol during bolus administration of intravenous contrast. CONTRAST:  152mL OMNIPAQUE IOHEXOL 300 MG/ML  SOLN COMPARISON:  Film from earlier in the same day. FINDINGS: CT CHEST FINDINGS Cardiovascular: Atherosclerotic calcifications of the thoracic aorta are noted. No aneurysmal dilatation or dissection is seen. No cardiac enlargement is noted. Pacing device is again seen. The pulmonary artery is enlarged without definitive  central filling defect. Mediastinum/Nodes: Thoracic inlet is within normal limits. Scattered small hilar and mediastinal lymph nodes are noted. The esophagus appears within normal limits. Bilateral axillary adenopathy is identified the largest of these on the right measures 12 mm. The largest of these on the left measures 11 mm. Lungs/Pleura: Emphysematous changes are seen. The left lung is clear. The emphysematous changes are more marked on the right. This contributes to the somewhat lucent area seen on recent chest x-ray. No sizable effusion is seen. Enhancing pleural base lesions are seen throughout the right chest adjacent to the anterior aspect of the right fourth rib as well as the posterolateral aspect of the right seventh rib and posterior aspect of the right ninth rib. Given the multiplicity of pleural based lesions, metastatic disease deserves consideration. Musculoskeletal: Pathologic fracture is noted involving the posterolateral aspect of the right seventh rib secondary to the pleural based mass. This corresponds with the finding seen on recent chest x-ray. No other definitive pathologic fracture is seen. The vertebral bodies show no definitive compression deformity. CT ABDOMEN PELVIS FINDINGS Hepatobiliary: No focal liver abnormality is seen. No gallstones, gallbladder wall thickening, or biliary dilatation. Pancreas: Unremarkable. No pancreatic ductal dilatation or surrounding inflammatory changes. Spleen: Normal in size without focal abnormality. Adrenals/Urinary Tract: Adrenal glands are within normal limits. Kidneys demonstrate a normal enhancement pattern bilaterally. No renal calculi or obstructive changes are seen. Kidneys demonstrate a normal excretion of contrast bilaterally on delayed images. The bladder is partially decompressed. Stomach/Bowel: Colon shows no obstructive or inflammatory changes. The appendix is well visualized and within normal limits. No small bowel or gastric abnormality  is noted. Vascular/Lymphatic: Atherosclerotic calcifications of the abdominal aorta are seen without definitive aneurysmal dilatation. Considerable retroperitoneal adenopathy is noted in the Peri aortic and intra-aortocaval region. The largest area is noted adjacent to aortic bifurcation measuring approximately 4.3 x 1.9 cm in greatest dimension. No significant inguinal adenopathy is noted. Bilateral common iliac adenopathy is seen as well. Reproductive: Prostate is unremarkable. Other: Minimal free fluid is noted which may be physiologic in nature. Musculoskeletal: Degenerative changes of lumbar spine are noted. There is a moth-eaten pattern identified within the right iliac bone adjacent to the sacroiliac joint as well as an area of bony destruction in the left half of the sacrum best seen on image number 105 of series 4. Soft tissue nodule is noted in the right buttock best seen on image number 85 of series 4 which measures 17 mm in greatest dimension. This may be related to the underlying  diffuse process although may be incidental. IMPRESSION: Constellation of findings as described above consistent with widespread neoplastic involvement. Given the lymphadenopathy in the possibility of lymphoma is felt to be most likely. Node biopsy would be helpful. Additionally PET-CT imaging is recommended for further evaluation. Electronically Signed   By: Inez Catalina M.D.   On: 12/09/2019 15:13   DG Chest Portable 1 View  Result Date: 11/24/2019 CLINICAL DATA:  54 year old male with history of shortness of breath. EXAM: PORTABLE CHEST 1 VIEW COMPARISON:  Chest x-ray 11/04/2019. FINDINGS: Previously noted right upper lobe nodule is less apparent on today's examination. There is an increasing vague masslike density projecting over the lateral aspect of the right mid hemithorax. Probable expansile lytic lesion in the posterolateral aspect of the right seventh rib where there is a nondisplaced fracture. Left lung is clear.  No pleural effusions. No evidence of pulmonary edema. Heart size is normal. Upper mediastinal contours are within normal limits. 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 IMPRESSION: 1. Unusual findings in the right hemithorax concerning for potential malignancy. Specifically, there appears to be an expansile lytic lesion in the posterolateral right seventh rib with probable nondisplaced pathologic fracture and adjacent ill-defined opacity in the right mid hemithorax. These findings should be better evaluated with follow-up contrast enhanced chest CT in the near future to exclude underlying malignancy. Electronically Signed   By: Vinnie Langton M.D.   On: 12/11/2019 12:43    EKG: Independently reviewed.  Sinus tachycardia, no ST elevation or depression noted.  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  CT head is negative for acute findings. -Unable to get MRI due to above mentioned reason. -Neurology consulted by [place] patient at stepdown unit.  On telemetry. -Discussed with neurology-No permissive hypertension and No further stroke work-up for now-his symptoms could be related to metastasis? -Check A1c/lipid panel -Frequent neuro check -Consult PT/OT/SLP -Continue aspirin and statin  Widespread neoplasm: Concern for lymphoma: -Reviewed CT chest/abdomen/pelvis result. -Consult oncology-await recommendations  HIV: Followed by infectious disease outpatient.  Was seen on 7/20 by Dr. Belenda Cruise. Jorje Guild was changed to [place] -Continue Valtrex for prophylaxis. -ID consult in am?  Right seventh rib pathologic fracture: -Likely secondary to underlying malignancy/lymphoma. -Patient denies any pain.  Tylenol as needed for pain.  Normocytic anemia: -H&H dropped from 11.2/33.3-9.9/31.3 in 1 month. -Check iron studies.  Monitor H&H closely.  Hypokalemia: Potassium of 2.7 -Replenished in ED. -Repeat BMP tomorrow a.m.  Hypomagnesemia: Magnesium level: 1.3.  Replenished in ED.  Repeat magnesium level tomorrow a.m.  Hypertension: Blood pressure is stable -Continue amlodipine and metoprolol.  Hyperlipidemia: Continue statin  Marijuana abuse: -Patient tells me that he uses marijuana 3 times daily to help with his appetite.  Oral candidiasis: Likely secondary to HIV -Start on nystatin  Moderate to severe protein calorie malnutrition: BMI of 19 -History of unintentional weight loss.  Will consult dietitian. -Continue megestrol.  ?  Tachybradycardia syndrome: Status post pacemaker: -Patient tells me that pacemaker was placed in July 2018 because of irregular heart  rhythm. -On telemetry.  Depression/bipolar: Continue Remeron.  DVT prophylaxis: Lovenox/SCD Code Status: Full code Family Communication: None present at bedside.  Plan of care discussed with patient in length and he verbalized understanding and agreed with it. Disposition Plan: To be determined Consults called: Neurology and oncology Admission status: Inpatient   Mckinley Jewel MD [facility]  If 7PM-7AM, please contact night-coverage [email or web address] Password [facility]  12/02/2019, 4:04 PM

## 2019-12-16 NOTE — ED Provider Notes (Signed)
[facility] EMERGENCY DEPARTMENT Provider Note   CSN: [id number] Arrival date & time: 12/13/2019  1125     History Chief Complaint  Patient presents with  . Neurologic Problem    Mike Holland is a 54 y.o. male.  [name] is a 54 y.o. male with a history of HIV, hypertension, stroke, pacemaker, depression, who presents to the emergency department for evaluation of 3 days of slurred speech.  Patient states that he spoke to his family member on the phone who noted that his voice sounded slurred and different than usual.  He states that yesterday he also noted shuffling gait and difficulty walking.  He has not had any numbness or weakness denies vision changes or dizziness.  States that he has had 2 prior strokes back when he lived in [place] and with one of them he had similar slurred speech.  Patient also states that he has had a 20 pound unintended weight loss over the past month, he was seen in in the infectious disease clinic on 7/20 and they changed his medications to [facility] and sent off some lab work to evaluate for weight loss but he has not heard anything about this.  He denies any chest pain.  Does report that he felt some slight shortness of breath when he woke up this morning, denies abdominal pain but reports that he has had poor appetite and some generalized fatigue.  He reports that he has had some intermittent joint pains in bilateral legs for the past few weeks.        Past Medical History:  Diagnosis Date  . Depression   . HIV (human immunodeficiency virus infection) ([place])   . Hypertension   . Pacemaker     Patient Active Problem List   Diagnosis Date Noted  . Unintentional weight loss 12/13/2019  . Healthcare maintenance 08/19/2019  . HIV disease ([place]) 06/24/2019  . Essential hypertension 06/24/2019  . Depression 06/24/2019    Past Surgical History:  Procedure Laterality Date  . PACEMAKER PLACEMENT         Family History    Problem Relation Age of Onset  . Heart disease Mother   . Heart disease Father     Social History   Tobacco Use  . Smoking status: Former Smoker    Types: Cigars  . Smokeless tobacco: Never Used  . Tobacco comment: 1-2 black and milds per day   Vaping Use  . Vaping Use: Never used  Substance Use Topics  . Alcohol use: Not Currently    Comment: holidays   . Drug use: Not Currently    Types: Marijuana    Home Medications Prior to Admission medications   Medication Sig Start Date End Date Taking? Authorizing Provider  acetaminophen (TYLENOL) 325 MG tablet Take 2 tablets (650 mg total) by mouth every 6 (six) hours as needed for up to 30 doses for mild pain or moderate pain. 11/04/19   Wyvonnia Dusky, MD  acetaminophen (TYLENOL) 500 MG tablet Take 1,000 mg by mouth every 6 (six) hours as needed for moderate pain.    [provider]  amLODipine (NORVASC) 10 MG tablet TAKE 1 TABLET(10 MG) BY MOUTH DAILY 10/03/19   Golden Circle, FNP  aspirin (ASPIRIN LOW DOSE) 81 MG EC tablet TAKE 1 TABLET(81 MG) BY MOUTH EVERY EVENING 10/03/19   Golden Circle, FNP  atorvastatin (LIPITOR) 20 MG tablet Take 1 tablet (20 mg total) by mouth daily. 11/16/19  Gildardo Pounds, NP  bictegravir-emtricitabine-tenofovir AF (BIKTARVY) 50-200-25 MG TABS tablet Take 1 tablet by mouth daily. 12/13/19   Golden Circle, FNP  Cholecalciferol (VITAMIN D3) 25 MCG (1000 UT) CAPS Take 1 capsule (1,000 Units total) by mouth daily. Patient not taking: Reported on 12/13/2019 10/03/19   Golden Circle, FNP  folic acid (FOLVITE) 1 MG tablet TAKE 1 TABLET(1 MG) BY MOUTH EVERY EVENING 10/03/19   Golden Circle, FNP  megestrol (MEGACE ES) 625 MG/5ML suspension Take 5 mLs (625 mg total) by mouth daily. 12/13/19   Golden Circle, FNP  methocarbamol (ROBAXIN) 500 MG tablet Take 1 tablet (500 mg total) by mouth 2 (two) times daily. 11/28/19   Tacy Learn, PA-C  metoprolol succinate (TOPROL-XL) 25 MG 24 hr  tablet TAKE 1 TABLET(25 MG) BY MOUTH EVERY EVENING 10/03/19   Golden Circle, FNP  predniSONE (STERAPRED UNI-PAK 21 TAB) 10 MG (21) TBPK tablet Take by mouth daily. Take 6 tabs by mouth daily  for 2 days, then 5 tabs for 2 days, then 4 tabs for 2 days, then 3 tabs for 2 days, 2 tabs for 2 days, then 1 tab by mouth daily for 2 days 11/28/19   Suella Broad A, PA-C  risperiDONE (RISPERDAL) 2 MG tablet TAKE 1 TABLET(2 MG) BY MOUTH AT BEDTIME 10/03/19   Golden Circle, FNP  valACYclovir (VALTREX) 500 MG tablet Take 1 tablet (500 mg total) by mouth every evening. 05/02/19   Isla Pence, MD  Vitamin D, Ergocalciferol, (DRISDOL) 1.25 MG (50000 UT) CAPS capsule Take 1 capsule (50,000 Units total) by mouth every Wednesday. Patient not taking: Reported on 12/13/2019 05/04/19   Isla Pence, MD    Allergies    Patient has no known allergies.  Review of Systems   Review of Systems  Constitutional: Positive for appetite change and fatigue. Negative for chills and fever.  HENT: Negative.   Respiratory: Positive for shortness of breath. Negative for cough.   Cardiovascular: Negative for chest pain.  Gastrointestinal: Negative for abdominal pain, nausea and vomiting.  Genitourinary: Negative for dysuria and frequency.  Musculoskeletal: Positive for arthralgias. Negative for myalgias.  Skin: Negative for color change and rash.  Neurological: Positive for speech difficulty. Negative for dizziness, syncope, weakness, light-headedness, numbness and headaches.  All other systems reviewed and are negative.   Physical Exam Updated Vital Signs BP 115/74   Pulse 105   Temp 98.3 F (36.8 C) (Oral)   Resp 16   Wt 55.3 kg   SpO2 98%   BMI 19.11 kg/m   Physical Exam Vitals and nursing note reviewed.  Constitutional:      General: He is not in acute distress.    Appearance: He is well-developed. He is not diaphoretic.     Comments: Cachectic gentleman that appears older than stated age, chronically  ill-appearing but in no acute distress  HENT:     Head: Normocephalic and atraumatic.  Eyes:     General:        Right eye: No discharge.        Left eye: No discharge.     Pupils: Pupils are equal, round, and reactive to light.  Cardiovascular:     Rate and Rhythm: Normal rate and regular rhythm.     Heart sounds: Normal heart sounds. No murmur heard.  No friction rub. No gallop.   Pulmonary:     Effort: Pulmonary effort is normal. No respiratory distress.     Breath sounds:  Normal breath sounds. No wheezing or rales.     Comments: Respirations equal and unlabored, patient able to speak in full sentences, lungs clear to auscultation bilaterally with some decreased breath sounds bilaterally Abdominal:     General: Bowel sounds are normal. There is no distension.     Palpations: Abdomen is soft. There is no mass.     Tenderness: There is no abdominal tenderness. There is no guarding.     Comments: Abdomen soft, nondistended, nontender to palpation in all quadrants without guarding or peritoneal signs  Musculoskeletal:        General: No deformity.     Cervical back: Neck supple.  Skin:    General: Skin is warm and dry.     Capillary Refill: Capillary refill takes less than 2 seconds.  Neurological:     Mental Status: He is alert.     Coordination: Coordination normal.     Comments: Speech is slurred, but no word finding difficulty or aphasia, able to follow commands CN III-XII intact Normal strength in upper and lower extremities bilaterally including dorsiflexion and plantar flexion, strong and equal grip strength Sensation normal to light and sharp touch Moves extremities without ataxia  Psychiatric:        Mood and Affect: Mood normal.        Behavior: Behavior normal.     ED Results / Procedures / Treatments   Labs (all labs ordered are listed, but only abnormal results are displayed) Labs Reviewed  PROTIME-INR - Abnormal; Notable for the following components:       Result Value   Prothrombin Time 16.9 (*)    INR 1.4 (*)    All other components within normal limits  APTT - Abnormal; Notable for the following components:   aPTT 38 (*)    All other components within normal limits  CBC - Abnormal; Notable for the following components:   WBC 12.1 (*)    RBC 3.39 (*)    Hemoglobin 9.9 (*)    HCT 31.3 (*)    nRBC 0.6 (*)    All other components within normal limits  DIFFERENTIAL - Abnormal; Notable for the following components:   Abs Immature Granulocytes 1.76 (*)    All other components within normal limits  COMPREHENSIVE METABOLIC PANEL - Abnormal; Notable for the following components:   Sodium 134 (*)    Potassium 2.7 (*)    Chloride 93 (*)    Glucose, Bld 113 (*)    Calcium 10.6 (*)    Albumin 2.2 (*)    Alkaline Phosphatase 183 (*)    Anion gap 17 (*)    All other components within normal limits  RAPID URINE DRUG SCREEN, HOSP PERFORMED - Abnormal; Notable for the following components:   Tetrahydrocannabinol POSITIVE (*)    All other components within normal limits  URINALYSIS, ROUTINE W REFLEX MICROSCOPIC - Abnormal; Notable for the following components:   Color, Urine AMBER (*)    Bilirubin Urine SMALL (*)    All other components within normal limits  MAGNESIUM - Abnormal; Notable for the following components:   Magnesium 1.3 (*)    All other components within normal limits  SARS CORONAVIRUS 2 BY RT PCR [facility] ORDER, [place] LAB)  ETHANOL    EKG EKG Interpretation  Date/Time:  Friday December 16 2019 11:32:02 EDT Ventricular Rate:  101 PR Interval:    QRS Duration: 89 QT Interval:  300 QTC Calculation: 389 R Axis:  82 Text Interpretation: Sinus tachycardia Baseline wander No significant change since last tracing Confirmed by Lajean Saver [phone]) on 12/09/2019 11:40:05 AM   Radiology CT HEAD WO CONTRAST  Result Date: 11/26/2019 CLINICAL DATA:  Slurred speech for the past 3 days with gait  disturbance since yesterday. History of 2 previous strokes. EXAM: CT HEAD WITHOUT CONTRAST TECHNIQUE: Contiguous axial images were obtained from the base of the skull through the vertex without intravenous contrast. COMPARISON:  None. FINDINGS: Brain: Mildly enlarged ventricles and cortical sulci. Mild patchy white matter low density in both cerebral hemispheres. Old right corona radiata lacunar infarct. No intracranial hemorrhage, mass lesion or CT evidence of acute infarction. Vascular: No hyperdense vessel or unexpected calcification. Skull: Normal. Negative for fracture or focal lesion. Sinuses/Orbits: Mild to moderate left maxillary sinus mucosal thickening and minimal right maxillary sinus mucosal thickening. Right ethmoid sinus rounded metallic foreign body compatible with a BB. Other: None. IMPRESSION: 1. No acute abnormality. 2. Mild diffuse cerebral and cerebellar atrophy. 3. Mild chronic small vessel white matter ischemic changes in both cerebral hemispheres. 4. Old right corona radiata lacunar infarct. 5. Mild chronic bilateral maxillary sinusitis. 6. Right ethmoid sinus BB. Electronically Signed   By: Claudie Revering M.D.   On: 12/18/2019 12:26   CT Chest W Contrast  Result Date: 11/28/2019 CLINICAL DATA:  Follow-up abnormal chest x-ray suggesting chest wall mass EXAM: CT CHEST, ABDOMEN, AND PELVIS WITH CONTRAST TECHNIQUE: Multidetector CT imaging of the chest, abdomen and pelvis was performed following the standard protocol during bolus administration of intravenous contrast. CONTRAST:  128m OMNIPAQUE IOHEXOL 300 MG/ML  SOLN COMPARISON:  Film from earlier in the same day. FINDINGS: CT CHEST FINDINGS Cardiovascular: Atherosclerotic calcifications of the thoracic aorta are noted. No aneurysmal dilatation or dissection is seen. No cardiac enlargement is noted. Pacing device is again seen. The pulmonary artery is enlarged without definitive central filling defect. Mediastinum/Nodes: Thoracic inlet is  within normal limits. Scattered small hilar and mediastinal lymph nodes are noted. The esophagus appears within normal limits. Bilateral axillary adenopathy is identified the largest of these on the right measures 12 mm. The largest of these on the left measures 11 mm. Lungs/Pleura: Emphysematous changes are seen. The left lung is clear. The emphysematous changes are more marked on the right. This contributes to the somewhat lucent area seen on recent chest x-ray. No sizable effusion is seen. Enhancing pleural base lesions are seen throughout the right chest adjacent to the anterior aspect of the right fourth rib as well as the posterolateral aspect of the right seventh rib and posterior aspect of the right ninth rib. Given the multiplicity of pleural based lesions, metastatic disease deserves consideration. Musculoskeletal: Pathologic fracture is noted involving the posterolateral aspect of the right seventh rib secondary to the pleural based mass. This corresponds with the finding seen on recent chest x-ray. No other definitive pathologic fracture is seen. The vertebral bodies show no definitive compression deformity. CT ABDOMEN PELVIS FINDINGS Hepatobiliary: No focal liver abnormality is seen. No gallstones, gallbladder wall thickening, or biliary dilatation. Pancreas: Unremarkable. No pancreatic ductal dilatation or surrounding inflammatory changes. Spleen: Normal in size without focal abnormality. Adrenals/Urinary Tract: Adrenal glands are within normal limits. Kidneys demonstrate a normal enhancement pattern bilaterally. No renal calculi or obstructive changes are seen. Kidneys demonstrate a normal excretion of contrast bilaterally on delayed images. The bladder is partially decompressed. Stomach/Bowel: Colon shows no obstructive or inflammatory changes. The appendix is well visualized and within normal limits. No small bowel or  gastric abnormality is noted. Vascular/Lymphatic: Atherosclerotic calcifications  of the abdominal aorta are seen without definitive aneurysmal dilatation. Considerable retroperitoneal adenopathy is noted in the Peri aortic and intra-aortocaval region. The largest area is noted adjacent to aortic bifurcation measuring approximately 4.3 x 1.9 cm in greatest dimension. No significant inguinal adenopathy is noted. Bilateral common iliac adenopathy is seen as well. Reproductive: Prostate is unremarkable. Other: Minimal free fluid is noted which may be physiologic in nature. Musculoskeletal: Degenerative changes of lumbar spine are noted. There is a moth-eaten pattern identified within the right iliac bone adjacent to the sacroiliac joint as well as an area of bony destruction in the left half of the sacrum best seen on image number 105 of series 4. Soft tissue nodule is noted in the right buttock best seen on image number 85 of series 4 which measures 17 mm in greatest dimension. This may be related to the underlying diffuse process although may be incidental. IMPRESSION: Constellation of findings as described above consistent with widespread neoplastic involvement. Given the lymphadenopathy in the possibility of lymphoma is felt to be most likely. Node biopsy would be helpful. Additionally PET-CT imaging is recommended for further evaluation. Electronically Signed   By: Inez Catalina M.D.   On: 12/15/2019 15:13   CT ABDOMEN PELVIS W CONTRAST  Result Date: 12/05/2019 CLINICAL DATA:  Follow-up abnormal chest x-ray suggesting chest wall mass EXAM: CT CHEST, ABDOMEN, AND PELVIS WITH CONTRAST TECHNIQUE: Multidetector CT imaging of the chest, abdomen and pelvis was performed following the standard protocol during bolus administration of intravenous contrast. CONTRAST:  172m OMNIPAQUE IOHEXOL 300 MG/ML  SOLN COMPARISON:  Film from earlier in the same day. FINDINGS: CT CHEST FINDINGS Cardiovascular: Atherosclerotic calcifications of the thoracic aorta are noted. No aneurysmal dilatation or dissection is  seen. No cardiac enlargement is noted. Pacing device is again seen. The pulmonary artery is enlarged without definitive central filling defect. Mediastinum/Nodes: Thoracic inlet is within normal limits. Scattered small hilar and mediastinal lymph nodes are noted. The esophagus appears within normal limits. Bilateral axillary adenopathy is identified the largest of these on the right measures 12 mm. The largest of these on the left measures 11 mm. Lungs/Pleura: Emphysematous changes are seen. The left lung is clear. The emphysematous changes are more marked on the right. This contributes to the somewhat lucent area seen on recent chest x-ray. No sizable effusion is seen. Enhancing pleural base lesions are seen throughout the right chest adjacent to the anterior aspect of the right fourth rib as well as the posterolateral aspect of the right seventh rib and posterior aspect of the right ninth rib. Given the multiplicity of pleural based lesions, metastatic disease deserves consideration. Musculoskeletal: Pathologic fracture is noted involving the posterolateral aspect of the right seventh rib secondary to the pleural based mass. This corresponds with the finding seen on recent chest x-ray. No other definitive pathologic fracture is seen. The vertebral bodies show no definitive compression deformity. CT ABDOMEN PELVIS FINDINGS Hepatobiliary: No focal liver abnormality is seen. No gallstones, gallbladder wall thickening, or biliary dilatation. Pancreas: Unremarkable. No pancreatic ductal dilatation or surrounding inflammatory changes. Spleen: Normal in size without focal abnormality. Adrenals/Urinary Tract: Adrenal glands are within normal limits. Kidneys demonstrate a normal enhancement pattern bilaterally. No renal calculi or obstructive changes are seen. Kidneys demonstrate a normal excretion of contrast bilaterally on delayed images. The bladder is partially decompressed. Stomach/Bowel: Colon shows no obstructive or  inflammatory changes. The appendix is well visualized and within normal limits. No  small bowel or gastric abnormality is noted. Vascular/Lymphatic: Atherosclerotic calcifications of the abdominal aorta are seen without definitive aneurysmal dilatation. Considerable retroperitoneal adenopathy is noted in the Peri aortic and intra-aortocaval region. The largest area is noted adjacent to aortic bifurcation measuring approximately 4.3 x 1.9 cm in greatest dimension. No significant inguinal adenopathy is noted. Bilateral common iliac adenopathy is seen as well. Reproductive: Prostate is unremarkable. Other: Minimal free fluid is noted which may be physiologic in nature. Musculoskeletal: Degenerative changes of lumbar spine are noted. There is a moth-eaten pattern identified within the right iliac bone adjacent to the sacroiliac joint as well as an area of bony destruction in the left half of the sacrum best seen on image number 105 of series 4. Soft tissue nodule is noted in the right buttock best seen on image number 85 of series 4 which measures 17 mm in greatest dimension. This may be related to the underlying diffuse process although may be incidental. IMPRESSION: Constellation of findings as described above consistent with widespread neoplastic involvement. Given the lymphadenopathy in the possibility of lymphoma is felt to be most likely. Node biopsy would be helpful. Additionally PET-CT imaging is recommended for further evaluation. Electronically Signed   By: Inez Catalina M.D.   On: 12/15/2019 15:13   DG Chest Portable 1 View  Result Date: 12/18/2019 CLINICAL DATA:  53 year old male with history of shortness of breath. EXAM: PORTABLE CHEST 1 VIEW COMPARISON:  Chest x-ray 11/04/2019. FINDINGS: Previously noted right upper lobe nodule is less apparent on today's examination. There is an increasing vague masslike density projecting over the lateral aspect of the right mid hemithorax. Probable expansile lytic  lesion in the posterolateral aspect of the right seventh rib where there is a nondisplaced fracture. Left lung is clear. No pleural effusions. No evidence of pulmonary edema. Heart size is normal. Upper mediastinal contours are within normal limits. 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 IMPRESSION: 1. Unusual findings in the right hemithorax concerning for potential malignancy. Specifically, there appears to be an expansile lytic lesion in the posterolateral right seventh rib with probable nondisplaced pathologic fracture and adjacent ill-defined opacity in the right mid hemithorax. These findings should be better evaluated with follow-up contrast enhanced chest CT in the near future to exclude underlying malignancy. Electronically Signed   By: Vinnie Langton M.D.   On: 12/04/2019 12:43    Procedures Procedures (including critical care time)  Medications Ordered in ED Medications  magnesium sulfate IVPB 2 g 50 mL (has no administration in time range)  potassium chloride 10 mEq in 100 mL IVPB (10 mEq Intravenous New Bag/Given 12/19/2019 1429)  iohexol (OMNIPAQUE) 300 MG/ML solution 100 mL (100 mLs Intravenous Contrast Given 12/18/2019 1442)    ED Course  I have reviewed the triage vital signs and the nursing notes.  Pertinent labs & imaging results that were available during my care of the patient were reviewed by me and considered in my medical decision making (see chart for details).  Clinical Course as of Dec 15 1600  Fri Dec 16, 2019  1325 Creatinine: 0.71 [KF]  1559 MR BRAIN WO CONTRAST [KF]    Clinical Course User Index [KF] Janet Berlin   MDM Rules/Calculators/A&P                          54 year old male presents with 3 days of slurred  speech, and yesterday developed shuffling gait and difficulty walking, prior history of stroke, and had similar slurred speech then that resolved.  No  associated headache and no other neurologic deficits noted.  Patient did report some shortness of breath this morning.  No fevers or chills.  History of HIV, recently saw infectious disease provider and was switched to [place], mentioned 20 pound unintended weight loss, lab work was sent off without clear cause for patient's weight loss.  He reports general fatigue, some intermittent arthralgias and poor appetite.  Lab work has been reviewed, significant for slight leukocytosis, hemoglobin of 9.9, hypokalemia of 2.7 with associated hypomagnesemia, IV replacement ordered.  Patient also has a calcium of 10.6 and elevated alk phos, normal renal function.  Chest x-ray with lytic lesion with pathologic fracture of the seventh rib as well as a opacity noted within the right thorax concerning for potential malignancy, will get chest abdomen and pelvis CTs to better evaluate for malignancy especially in the setting of patient's fatigue, poor appetite and unintended weight loss.  CT of the head without acute findings, prior strokes noted as well as chronic atrophy.  Will consult neurology.  Case discussed with Dr. Lorrin Goodell with neurology who recommends MRI.  Nursing staff was able to obtain pacemaker information from patient's cardiologist in [place], this was passed on to the MRI tech who states that it is conditional for MRI and will need one of the trained MRI techs to change pacemaker into MRI safe mode, there are no staff available today that are trained to do this.  Given patient's continued symptoms feel he warrants admission for further evaluation and MRI.  CTs of the chest abdomen pelvis returned with diffuse lymphadenopathy concerning for lymphoma patient also with additional lytic lesions noted to the ilium and sacrum as well as the pathologic fracture noted on chest x-ray.  Case discussed with Dr. Doristine Bosworth with [facility] hospitalist who will see and admit the patient, request that oncology be  consulted.  Case discussed with Dr. Lorenso Courier with oncology who will see the patient in consult and provide recommendations for further oncologic work-up.  Final Clinical Impression(s) / ED Diagnoses Final diagnoses:  Slurred speech  Gait abnormality  Lymphadenopathy  Lytic bone lesions on xray  Pathological fracture of rib, initial encounter  Hypokalemia  Hypomagnesemia    Rx / DC Orders ED Discharge Orders    None       Janet Berlin 12/20/2019 1619    Lajean Saver, MD 12/19/19 [phone]

## 2019-12-16 NOTE — Progress Notes (Signed)
According to PA Benedetto Goad, pt has [employer] Pacemaker model #: [id number]  Serial number is [id number]   Called [employer] for lead information.  -Lead [id number] -Lead [id number]  -serial number for leads are RV [id number]   RA [id number]

## 2019-12-16 NOTE — Progress Notes (Signed)
Received patient from ED RN. Moved patient from stretcher to bed. Patient moving all extremities, no complaints. VSS. Wiped down with CHG wipes and new gown placed on patient. Explained the plan of care for tonight, patient agreed. Will continue to monitor every 2 hours as indicated.

## 2019-12-17 DIAGNOSIS — F1210 Cannabis abuse, uncomplicated: Secondary | ICD-10-CM

## 2019-12-17 DIAGNOSIS — R591 Generalized enlarged lymph nodes: Secondary | ICD-10-CM

## 2019-12-17 DIAGNOSIS — Z66 Do not resuscitate: Secondary | ICD-10-CM

## 2019-12-17 DIAGNOSIS — F320 Major depressive disorder, single episode, mild: Secondary | ICD-10-CM

## 2019-12-17 DIAGNOSIS — R4781 Slurred speech: Secondary | ICD-10-CM

## 2019-12-17 DIAGNOSIS — Z950 Presence of cardiac pacemaker: Secondary | ICD-10-CM

## 2019-12-17 DIAGNOSIS — Z7189 Other specified counseling: Secondary | ICD-10-CM

## 2019-12-17 DIAGNOSIS — E7800 Pure hypercholesterolemia, unspecified: Secondary | ICD-10-CM

## 2019-12-17 DIAGNOSIS — Z515 Encounter for palliative care: Secondary | ICD-10-CM

## 2019-12-17 DIAGNOSIS — C771 Secondary and unspecified malignant neoplasm of intrathoracic lymph nodes: Secondary | ICD-10-CM

## 2019-12-17 DIAGNOSIS — C800 Disseminated malignant neoplasm, unspecified: Secondary | ICD-10-CM

## 2019-12-17 DIAGNOSIS — B20 Human immunodeficiency virus [HIV] disease: Secondary | ICD-10-CM

## 2019-12-17 DIAGNOSIS — E876 Hypokalemia: Secondary | ICD-10-CM

## 2019-12-17 DIAGNOSIS — R269 Unspecified abnormalities of gait and mobility: Secondary | ICD-10-CM

## 2019-12-17 LAB — COMPREHENSIVE METABOLIC PANEL
ALT: 12 U/L (ref 0–44)
AST: 26 U/L (ref 15–41)
Albumin: 2 g/dL — ABNORMAL LOW (ref 3.5–5.0)
Alkaline Phosphatase: 159 U/L — ABNORMAL HIGH (ref 38–126)
Anion gap: 16 — ABNORMAL HIGH (ref 5–15)
BUN: 6 mg/dL (ref 6–20)
CO2: 26 mmol/L (ref 22–32)
Calcium: 10 mg/dL (ref 8.9–10.3)
Chloride: 95 mmol/L — ABNORMAL LOW (ref 98–111)
Creatinine, Ser: 0.7 mg/dL (ref 0.61–1.24)
GFR calc Af Amer: >60 mL/min (ref >60)
GFR calc non Af Amer: >60 mL/min (ref >60)
Glucose, Bld: 68 mg/dL — ABNORMAL LOW (ref 70–99)
Potassium: 3 mmol/L — ABNORMAL LOW (ref 3.5–5.1)
Sodium: 137 mmol/L (ref 135–145)
Total Bilirubin: 1.5 mg/dL — ABNORMAL HIGH (ref 0.3–1.2)
Total Protein: 6.2 g/dL — ABNORMAL LOW (ref 6.5–8.1)

## 2019-12-17 LAB — CBC
HCT: 28.9 % — ABNORMAL LOW (ref 39.0–52.0)
Hemoglobin: 9.4 g/dL — ABNORMAL LOW (ref 13.0–17.0)
MCH: 29.9 pg (ref 26.0–34.0)
MCHC: 32.5 g/dL (ref 30.0–36.0)
MCV: 92 fL (ref 80.0–100.0)
Platelets: 136 10*3/uL — ABNORMAL LOW (ref 150–400)
RBC: 3.14 MIL/uL — ABNORMAL LOW (ref 4.22–5.81)
RDW: 14 % (ref 11.5–15.5)
WBC: 9 10*3/uL (ref 4.0–10.5)
nRBC: 0.4 % — ABNORMAL HIGH (ref 0.0–0.2)

## 2019-12-17 LAB — MAGNESIUM: Magnesium: 1.6 mg/dL — ABNORMAL LOW (ref 1.7–2.4)

## 2019-12-17 LAB — GLUCOSE, CAPILLARY
Glucose-Capillary: 131 mg/dL — ABNORMAL HIGH (ref 70–99)
Glucose-Capillary: 160 mg/dL — ABNORMAL HIGH (ref 70–99)
Glucose-Capillary: 175 mg/dL — ABNORMAL HIGH (ref 70–99)
Glucose-Capillary: 69 mg/dL — ABNORMAL LOW (ref 70–99)
Glucose-Capillary: 88 mg/dL (ref 70–99)
Glucose-Capillary: 99 mg/dL (ref 70–99)

## 2019-12-17 LAB — LACTATE DEHYDROGENASE: LDH: 2099 U/L — ABNORMAL HIGH (ref 98–192)

## 2019-12-17 MED ORDER — PROSOURCE PLUS PO LIQD
30.0000 mL | Freq: Two times a day (BID) | ORAL | Status: DC
  Administered 2019-12-17 – 2019-12-26 (×14): 30 mL via ORAL
  Filled 2019-12-17 (×19): qty 30

## 2019-12-17 MED ORDER — INSULIN ASPART 100 UNIT/ML ~~LOC~~ SOLN
0.0000 [IU] | SUBCUTANEOUS | Status: DC
  Administered 2019-12-17: 2 [IU] via SUBCUTANEOUS
  Administered 2019-12-18: 3 [IU] via SUBCUTANEOUS
  Administered 2019-12-18: 1 [IU] via SUBCUTANEOUS
  Administered 2019-12-18: 3 [IU] via SUBCUTANEOUS
  Administered 2019-12-18: 2 [IU] via SUBCUTANEOUS
  Administered 2019-12-18: 3 [IU] via SUBCUTANEOUS
  Administered 2019-12-19: 1 [IU] via SUBCUTANEOUS
  Administered 2019-12-19: 3 [IU] via SUBCUTANEOUS
  Administered 2019-12-20: 1 [IU] via SUBCUTANEOUS

## 2019-12-17 MED ORDER — MAGNESIUM SULFATE 50 % IJ SOLN
3.0000 g | Freq: Once | INTRAVENOUS | Status: AC
  Administered 2019-12-17: 3 g via INTRAVENOUS
  Filled 2019-12-17: qty 6

## 2019-12-17 MED ORDER — ENSURE ENLIVE PO LIQD
237.0000 mL | Freq: Three times a day (TID) | ORAL | Status: DC
  Administered 2019-12-17 – 2019-12-26 (×20): 237 mL via ORAL

## 2019-12-17 MED ORDER — ALBUMIN HUMAN 25 % IV SOLN
50.0000 g | Freq: Once | INTRAVENOUS | Status: AC
  Administered 2019-12-17: 50 g via INTRAVENOUS
  Filled 2019-12-17: qty 200

## 2019-12-17 MED ORDER — POTASSIUM CHLORIDE CRYS ER 20 MEQ PO TBCR
40.0000 meq | EXTENDED_RELEASE_TABLET | Freq: Two times a day (BID) | ORAL | Status: AC
  Administered 2019-12-17 (×2): 40 meq via ORAL
  Filled 2019-12-17 (×2): qty 2

## 2019-12-17 MED ORDER — DEXTROSE-NACL 5-0.9 % IV SOLN: INTRAVENOUS | Status: DC

## 2019-12-17 MED ORDER — DEXTROSE 50 % IV SOLN
12.5000 g | INTRAVENOUS | Status: AC
  Administered 2019-12-17: 12.5 g via INTRAVENOUS
  Filled 2019-12-17: qty 50

## 2019-12-17 NOTE — Evaluation (Signed)
Physical Therapy Evaluation Patient Details Name: Mike Holland MRN: [id number] DOB: 09/04/1965 Today's Date: 12/17/2019   History of Present Illness  Pt is a 54yo male with PMH: HIV, GYN, stroke, pacemaker and depression who was admitted on 12/09/2019 with slurred speech and shuffling gaitx3days. Pt with report of a 20 pound weight loss in the last 2 weeks. 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    Clinical Impression  Pt admitted with above. Pt was walking with a RW beginning recently due to difficulty walking. Pt lives alone but states family lives around the corner and plans on getting a caregiver. Pt at this time demo's decreased activity tolerance, impaired balance, and requires assist for ambulation. Pt will need to do a flight of stairs to return home. PT to cont to progress indep as medical plan is decided. Acute PT to cont to follow.    Follow Up Recommendations Home health PT;Supervision/Assistance - 24 hour    Equipment Recommendations  None recommended by PT    Recommendations for Other Services       Precautions / Restrictions Precautions Precautions: Fall Restrictions Weight Bearing Restrictions: No      Mobility  Bed Mobility Overal bed mobility: Modified Independent             General bed mobility comments: HOB elevated, pt able to bring self over to EOB and don socks at EOB  Transfers Overall transfer level: Needs assistance Equipment used: Rolling walker (2 wheeled) Transfers: Sit to/from Stand Sit to Stand: Min guard         General transfer comment: min guard for safety, increased time, min guard to steady during transition of hands, incresaed time  Ambulation/Gait Ambulation/Gait assistance: Min assist Gait Distance (Feet): 20 Feet Assistive device: Rolling walker (2 wheeled) Gait Pattern/deviations: Step-through  pattern;Decreased stride length;Narrow base of support Gait velocity: slow Gait velocity interpretation: <1.31 ft/sec, indicative of household ambulator General Gait Details: pt with short step length and height, dependent on RW, pt with drop in O2 to 50s however poor wave form, pt with report "it's hard to catch my breath requiring freq standing rest breaks"  Stairs            Wheelchair Mobility    Modified Rankin (Stroke Patients Only)       Balance Overall balance assessment: Needs assistance Sitting-balance support: No upper extremity supported;Feet supported Sitting balance-Leahy Scale: Fair     Standing balance support: Bilateral upper extremity supported Standing balance-Leahy Scale: Poor Standing balance comment: needs RW for safe ambulation                             Pertinent Vitals/Pain Pain Assessment: No/denies pain    Home Living Family/patient expects to be discharged to:: Private residence Living Arrangements: Alone Available Help at Discharge: Friend(s);Available PRN/intermittently Type of Home: Apartment Home Access: Stairs to enter Entrance Stairs-Rails: Psychiatric nurse of Steps: 12 Home Layout: One level Home Equipment: Walker - 2 wheels      Prior Function Level of Independence: Needs assistance   Gait / Transfers Assistance Needed: pt has been recently using RW for amb  ADL's / Homemaking Assistance Needed: niece takes pt to grocery store and he uses electronic buggy. Niece supervises dressing/bathing        Hand Dominance  Extremity/Trunk Assessment   Upper Extremity Assessment Upper Extremity Assessment: Generalized weakness    Lower Extremity Assessment Lower Extremity Assessment: Generalized weakness    Cervical / Trunk Assessment Cervical / Trunk Assessment: Normal  Communication   Communication: Expressive difficulties  Cognition Arousal/Alertness: Awake/alert Behavior During  Therapy: WFL for tasks assessed/performed Overall Cognitive Status: Within Functional Limits for tasks assessed                                        General Comments General comments (skin integrity, edema, etc.): pt with noted drop in SpO2 on RA and reports of SOB however waveform was not good, upon sitting pts SPO2 inc to 97% with good waveform    Exercises     Assessment/Plan    PT Assessment Patient needs continued PT services  PT Problem List Decreased strength;Decreased activity tolerance;Decreased balance;Decreased mobility;Decreased coordination       PT Treatment Interventions DME instruction;Gait training;Stair training;Functional mobility training;Therapeutic activities;Therapeutic exercise    PT Goals (Current goals can be found in the Care Plan section)  Acute Rehab PT Goals Patient Stated Goal: home PT Goal Formulation: With patient Time For Goal Achievement: 12/31/19 Potential to Achieve Goals: Good    Frequency Min 4X/week   Barriers to discharge Decreased caregiver support reports "I"m going to get a caregiver when I get home"    Co-evaluation               AM-PAC PT "6 Clicks" Mobility  Outcome Measure Help needed turning from your back to your side while in a flat bed without using bedrails?: None Help needed moving from lying on your back to sitting on the side of a flat bed without using bedrails?: None Help needed moving to and from a bed to a chair (including a wheelchair)?: A Little Help needed standing up from a chair using your arms (e.g., wheelchair or bedside chair)?: A Little Help needed to walk in hospital room?: A Little Help needed climbing 3-5 steps with a railing? : A Little 6 Click Score: 20    End of Session Equipment Utilized During Treatment: Gait belt Activity Tolerance: Patient limited by fatigue Patient left: in chair;with call bell/phone within reach Nurse Communication: Mobility status PT Visit  Diagnosis: Unsteadiness on feet (R26.81);Difficulty in walking, not elsewhere classified (R26.2)    Time: 4503-8882 PT Time Calculation (min) (ACUTE ONLY): 24 min   Charges:   PT Evaluation $PT Eval Moderate Complexity: 1 Mod PT Treatments $Gait Training: 8-22 mins        Kittie Plater, PT, DPT Acute Rehabilitation Services Pager #: [phone] Office #: [phone]   Berline Lopes 12/17/2019, 1:15 PM

## 2019-12-17 NOTE — Consult Note (Addendum)
[facility] Surgery Consult Note  LANTZ HERMANN 06/15/1965  [id number].    Requesting MD: Dia Crawford Chief Complaint:Slurred speech with shuffling gait x3 days Reason for Consult: Right axillary lymph node biopsy  HPI: Patient is a 54 year old male with a history of HIV, hypertension, stroke, history of permanent transvenous pacemaker, history of depression who presented to the ED 12/22/2019 with 3 days of slurred speech.  He was speaking to family members that noticed his speech was slurred and he reported that he is developed a slow shuffling gait yesterday and was having difficulty walking.  He has a history of a prior stroke in [place] with similar symptoms.  He also noted a 20 pound weight loss over the past month.  He was most recently seen by infectious disease on 12/13/2019.  They changed his HIV medications and requested some lab studies but had not heard any results.  Work-up in the ED showed he was afebrile with some tachycardia blood pressure was stable.  CMP shows a sodium of 134, potassium of 2.7, chloride 93, glucose 113, calcium 10.6, magnesium 1.3, alk phos 183, albumin 2.2 LFTs were normal.  WBC 12.1, hemoglobin 9.9, hematocrit 31.3, platelets 156,000.  INR 1.4, PTT 38, hemoglobin A1c 6.1, Covid is negative.  Drug screen is positive for THC.  CT of the head showed no acute abnormalities some mild diffuse cerebellar and cerebral atrophy.  Old right corona radiata infarct some maxillary and ethmoid sinusitis.  Chest x-ray was abnormal CT of the chest, abdomen, and pelvis was obtained.  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  Pleural-based lesions throughout the right chest to the anterior adjacent right fourth rib, as well as the posterior lateral aspect of the right seventh rib, posterior aspect of the right ninth rib.  Pathologic fractures involving the right seventh rib secondary to the pleural-based mass corresponds to findings on chest x-ray.  Bilateral axillary  lymphadenopathy is identified the largest of these is on the right measures 12 mm.  The largest on the left measures 11 mm.      CT of the abdomen pelvis shows atherosclerotic calcification of the abdominal aorta without aneurysmal dilatation.  There are considerable retroperitoneal adenopathy noted in the periaortic and the intraaortocaval region the largest adjacent to the bifurcation measuring 4.3 x 1.9 cm.  No significant inguinal adenopathy is noted bilateral common iliac adenopathy is seen.  Degenerative changes of the lumbar spine, moth-eaten pattern identified the right iliac bone adjacent to the SI joint as well as bony destruction of the left half of the sacrum best seen image 104.  Is also soft tissue nodule noted in the right buttocks measuring 17 mm in its greatest dimension, which may or may not be incidental. 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 We are asked to see for a axillary lymph node biopsy.  ROS: Review of Systems  Constitutional: Positive for weight loss (20 lbs, last couple weeks, just didn't want to eat what was available).  HENT: Negative.   Eyes: Negative.   Respiratory: Negative.   Cardiovascular: Negative.   Gastrointestinal: Negative.   Genitourinary: Negative.   Musculoskeletal: Negative.   Skin: Negative.   Neurological: Positive for speech change.       Speech changes, trouble walking for 2-4 days  Psychiatric/Behavioral: Positive for depression.    Family History  Problem Relation Age of Onset  . Heart disease Mother   . Heart disease Father     Past Medical History:  Diagnosis Date  . Depression   . HIV (human immunodeficiency virus infection) ([place])   . Hypertension   . Pacemaker     Past Surgical History:  Procedure Laterality Date  . PACEMAKER PLACEMENT      Social History:  reports that he has quit smoking. His smoking use included cigars. He has never used smokeless tobacco. He reports  previous alcohol use. He reports previous drug use. Drug: Marijuana. Lives alone and has a niece who helps him some. Tobacco:  25 years, quit after stroke 2-3 years ago ETOH:  None Drugs:  Marijuana  On disability for depression    Allergies: No Known Allergies  Medications Prior to Admission  Medication Sig Dispense Refill  . acetaminophen (TYLENOL) 500 MG tablet Take 1,000 mg by mouth every 6 (six) hours as needed for moderate pain.    Marland Kitchen amLODipine (NORVASC) 10 MG tablet TAKE 1 TABLET(10 MG) BY MOUTH DAILY (Patient taking differently: Take 10 mg by mouth daily. ) 30 tablet 3  . atorvastatin (LIPITOR) 20 MG tablet Take 1 tablet (20 mg total) by mouth daily. 90 tablet 3  . bictegravir-emtricitabine-tenofovir AF (BIKTARVY) 50-200-25 MG TABS tablet Take 1 tablet by mouth daily. 30 tablet 11  . folic acid (FOLVITE) 1 MG tablet TAKE 1 TABLET(1 MG) BY MOUTH EVERY EVENING (Patient taking differently: Take 1 mg by mouth every evening. ) 30 tablet 3  . megestrol (MEGACE ES) 625 MG/5ML suspension Take 5 mLs (625 mg total) by mouth daily. 150 mL 0  . methocarbamol (ROBAXIN) 500 MG tablet Take 1 tablet (500 mg total) by mouth 2 (two) times daily. 12 tablet 0  . metoprolol succinate (TOPROL-XL) 25 MG 24 hr tablet TAKE 1 TABLET(25 MG) BY MOUTH EVERY EVENING (Patient taking differently: Take 25 mg by mouth every evening. ) 30 tablet 3  . risperiDONE (RISPERDAL) 2 MG tablet TAKE 1 TABLET(2 MG) BY MOUTH AT BEDTIME (Patient taking differently: Take 2 mg by mouth at bedtime. ) 30 tablet 3  . acetaminophen (TYLENOL) 325 MG tablet Take 2 tablets (650 mg total) by mouth every 6 (six) hours as needed for up to 30 doses for mild pain or moderate pain. (Patient not taking: Reported on 12/23/2019) 30 tablet 0  . aspirin (ASPIRIN LOW DOSE) 81 MG EC tablet TAKE 1 TABLET(81 MG) BY MOUTH EVERY EVENING (Patient not taking: Reported on 12/02/2019) 30 tablet 3  . Cholecalciferol (VITAMIN D3) 25 MCG (1000 UT) CAPS Take 1  capsule (1,000 Units total) by mouth daily. (Patient not taking: Reported on 12/13/2019) 30 capsule 3  . predniSONE (STERAPRED UNI-PAK 21 TAB) 10 MG (21) TBPK tablet Take by mouth daily. Take 6 tabs by mouth daily  for 2 days, then 5 tabs for 2 days, then 4 tabs for 2 days, then 3 tabs for 2 days, 2 tabs for 2 days, then 1 tab by mouth daily for 2 days (Patient not taking: Reported on 12/01/2019) 42 tablet 0  . valACYclovir (VALTREX) 500 MG tablet Take 1 tablet (500 mg total) by mouth every evening. (Patient not taking: Reported on 12/18/2019) 30 tablet 0  . Vitamin D, Ergocalciferol, (DRISDOL) 1.25 MG (50000 UT) CAPS capsule Take 1 capsule (50,000 Units total) by mouth every Wednesday. (Patient not taking: Reported on 12/13/2019) 5 capsule 0    Blood pressure 108/77, pulse 96, temperature 97.6 F (36.4 C), temperature source Oral, resp. rate 22, weight 54.9 kg, SpO2 97 %. Physical Exam:  General: pleasant, thin cachectic male who is laying  in bed in NAD HEENT: head is normocephalic, atraumatic.  Sclera are noninjected.  Pupils are equal.  Ears and nose without any masses or lesions.  Mouth is pink and moist Heart: regular, rate, and rhythm.  Normal s1,s2. No obvious murmurs, gallops, or rubs noted.  Palpable radial and pedal pulses bilaterally Lungs: CTAB, no wheezes, rhonchi, or rales noted.  Respiratory effort non-labored. No Tenderness to palpation of the ribs left and right, he notes he fell and hurt right side in June after a fall.  I do not feel any sizable axillary lymph nodes as described by the CT.  Abd: soft, NT, ND, +BS, no masses, hernias, or organomegaly MS: all 4 extremities are symmetrical with no cyanosis, clubbing, or edema. Skin: warm and dry with no masses, lesions, or rashes Neuro: Cranial nerves 2-12 grossly intact, sensation is normal throughout Psych: A&Ox3 with an appropriate affect.   Results for orders placed or performed during the hospital encounter of 12/09/2019 (from the  past 48 hour(s))  Ethanol     Status: None   Collection Time: 11/29/2019 11:35 AM  Result Value Ref Range   Alcohol, Ethyl (B) <10 <10 mg/dL    Comment: (NOTE) Lowest detectable limit for serum alcohol is 10 mg/dL.  For medical purposes only. Performed at [facility], [place] [address] [place], [place] [address]   Protime-INR     Status: Abnormal   Collection Time: 11/26/2019 11:35 AM  Result Value Ref Range   Prothrombin Time 16.9 (H) 11.4 - 15.2 seconds   INR 1.4 (H) 0.8 - 1.2    Comment: (NOTE) INR goal varies based on device and disease states. Performed at [facility], [place] [address], [place], [place] [address]   APTT     Status: Abnormal   Collection Time: 11/27/2019 11:35 AM  Result Value Ref Range   aPTT 38 (H) 24 - 36 seconds    Comment:        IF BASELINE aPTT IS ELEVATED, SUGGEST PATIENT RISK ASSESSMENT BE USED TO DETERMINE APPROPRIATE ANTICOAGULANT THERAPY. Performed at [facility], [place] [address], [place], [place] [address]   CBC     Status: Abnormal   Collection Time: 12/12/2019 11:35 AM  Result Value Ref Range   WBC 12.1 (H) 4.0 - 10.5 K/uL   RBC 3.39 (L) 4.22 - 5.81 MIL/uL   Hemoglobin 9.9 (L) 13.0 - 17.0 g/dL   HCT 31.3 (L) 39 - 52 %   MCV 92.3 80.0 - 100.0 fL   MCH 29.2 26.0 - 34.0 pg   MCHC 31.6 30.0 - 36.0 g/dL   RDW 13.8 11.5 - 15.5 %   Platelets 156 150 - 400 K/uL   nRBC 0.6 (H) 0.0 - 0.2 %    Comment: Performed at [facility], [place] [address], [place], [place] [address]  Differential     Status: Abnormal   Collection Time: 12/11/2019 11:35 AM  Result Value Ref Range   Neutrophils Relative % 60 %   Neutro Abs 7.4 1.7 - 7.7 K/uL   Lymphocytes Relative 18 %   Lymphs Abs 2.1 0.7 - 4.0 K/uL   Monocytes Relative 5 %   Monocytes Absolute 0.5 0 - 1 K/uL   Eosinophils Relative 1 %   Eosinophils Absolute 0.2 0 - 0 K/uL   Basophils Relative 1 %   Basophils Absolute 0.1 0 - 0 K/uL   WBC Morphology See Note     Comment: Increased  Bands. >20% Bands  Mild Left Shift. 1 to 5% Metas and Myelos, Occ Pro Noted.    Immature Granulocytes 15 %   Abs Immature Granulocytes 1.76 (H) 0.00 - 0.07 K/uL    Comment: Performed at [facility], [place] [address], [place], [place] [address]  Comprehensive metabolic panel     Status: Abnormal   Collection Time: 12/12/2019 11:35 AM  Result Value Ref Range   Sodium 134 (L) 135 - 145 mmol/L   Potassium 2.7 (LL) 3.5 - 5.1 mmol/L    Comment: CRITICAL RESULT CALLED TO, READ BACK BY AND VERIFIED WITH: C FELTS RN [id number] BY A BENNETT    Chloride 93 (L) 98 - 111 mmol/L   CO2 24 22 - 32 mmol/L   Glucose, Bld 113 (H) 70 - 99 mg/dL    Comment: Glucose reference range applies only to samples taken after fasting for at least 8 hours.   BUN 7 6 - 20 mg/dL   Creatinine, Ser 0.71 0.61 - 1.24 mg/dL   Calcium 10.6 (H) 8.9 - 10.3 mg/dL   Total Protein 6.8 6.5 - 8.1 g/dL   Albumin 2.2 (L) 3.5 - 5.0 g/dL   AST 29 15 - 41 U/L   ALT 13 0 - 44 U/L   Alkaline Phosphatase 183 (H) 38 - 126 U/L   Total Bilirubin 0.9 0.3 - 1.2 mg/dL   GFR calc non Af Amer >60 >60 mL/min   GFR calc Af Amer >60 >60 mL/min   Anion gap 17 (H) 5 - 15    Comment: Performed at [facility] [address] [place], [place] [address]  Urine rapid drug screen (hosp performed)     Status: Abnormal   Collection Time: 12/17/2019 12:54 PM  Result Value Ref Range   Opiates NONE DETECTED NONE DETECTED   Cocaine NONE DETECTED NONE DETECTED   Benzodiazepines NONE DETECTED NONE DETECTED   Amphetamines NONE DETECTED NONE DETECTED   Tetrahydrocannabinol POSITIVE (A) NONE DETECTED   Barbiturates NONE DETECTED NONE DETECTED    Comment: (NOTE) DRUG SCREEN FOR MEDICAL PURPOSES ONLY.  IF CONFIRMATION IS NEEDED FOR ANY PURPOSE, NOTIFY LAB WITHIN 5 DAYS.  LOWEST DETECTABLE LIMITS FOR URINE DRUG SCREEN Drug Class                     Cutoff (ng/mL) Amphetamine and metabolites    1000 Barbiturate and metabolites     200 Benzodiazepine                 154 Tricyclics and metabolites     300 Opiates and metabolites        300 Cocaine and metabolites        300 THC                            50 Performed at [facility], [place] [address] [place], [place] [address]   Urinalysis, Routine w reflex microscopic     Status: Abnormal   Collection Time: 12/13/2019 12:54 PM  Result Value Ref Range   Color, Urine AMBER (A) YELLOW    Comment: BIOCHEMICALS MAY BE AFFECTED BY COLOR   APPearance CLEAR CLEAR   Specific Gravity, Urine 1.020 1.005 - 1.030   pH 6.0 5.0 - 8.0   Glucose, UA NEGATIVE NEGATIVE mg/dL   Hgb urine dipstick NEGATIVE NEGATIVE   Bilirubin Urine SMALL (A) NEGATIVE   Ketones, ur  NEGATIVE NEGATIVE mg/dL   Protein, ur NEGATIVE NEGATIVE mg/dL   Nitrite NEGATIVE NEGATIVE   Leukocytes,Ua NEGATIVE NEGATIVE    Comment: Performed at [facility] [address] [place], [place] [address]  Magnesium     Status: Abnormal   Collection Time: 12/01/2019  1:00 PM  Result Value Ref Range   Magnesium 1.3 (L) 1.7 - 2.4 mg/dL    Comment: Performed at [facility] [address], [place], [place] [address]  SARS Coronavirus 2 by RT PCR (hospital order, performed in [facility] hospital lab) Nasopharyngeal Nasopharyngeal Swab     Status: None   Collection Time: 12/05/2019  3:08 PM   Specimen: Nasopharyngeal Swab  Result Value Ref Range   SARS Coronavirus 2 NEGATIVE NEGATIVE    Comment: (NOTE) SARS-CoV-2 target nucleic acids are NOT DETECTED.  The SARS-CoV-2 RNA is generally detectable in upper and lower respiratory specimens during the acute phase of infection. The lowest concentration of SARS-CoV-2 viral copies this assay can detect is 250 copies / mL. A negative result does not preclude SARS-CoV-2 infection and should not be used as the sole basis for treatment or other patient management decisions.  A negative result may occur with improper specimen collection / handling, submission of  specimen other than nasopharyngeal swab, presence of viral mutation(s) within the areas targeted by this assay, and inadequate number of viral copies (<250 copies / mL). A negative result must be combined with clinical observations, patient history, and epidemiological information.  Fact Sheet for Patients:   StrictlyIdeas.no  Fact Sheet for Healthcare Providers: BankingDealers.co.za  This test is not yet approved or  cleared by the [place] FDA and has been authorized for detection and/or diagnosis of SARS-CoV-2 by FDA under an Emergency Use Authorization (EUA).  This EUA will remain in effect (meaning this test can be used) for the duration of the COVID-19 declaration under Section 564(b)(1) of the Act, 21 U.S.C. section 360bbb-3(b)(1), unless the authorization is terminated or revoked sooner.  Performed at [facility], [place] [address], [place], [place] [address]   CBC     Status: Abnormal   Collection Time: 11/27/2019  4:37 PM  Result Value Ref Range   WBC 10.4 4.0 - 10.5 K/uL   RBC 3.14 (L) 4.22 - 5.81 MIL/uL   Hemoglobin 9.2 (L) 13.0 - 17.0 g/dL   HCT 28.9 (L) 39 - 52 %   MCV 92.0 80.0 - 100.0 fL   MCH 29.3 26.0 - 34.0 pg   MCHC 31.8 30.0 - 36.0 g/dL   RDW 13.8 11.5 - 15.5 %   Platelets 140 (L) 150 - 400 K/uL   nRBC 0.6 (H) 0.0 - 0.2 %    Comment: Performed at [facility] [address] [place], [place] [address]  Creatinine, serum     Status: Abnormal   Collection Time: 12/05/2019  4:37 PM  Result Value Ref Range   Creatinine, Ser 0.58 (L) 0.61 - 1.24 mg/dL   GFR calc non Af Amer >60 >60 mL/min   GFR calc Af Amer >60 >60 mL/min    Comment: Performed at [facility] [address], [place], [place] [address]  Phosphorus     Status: None   Collection Time: 12/15/2019  4:37 PM  Result Value Ref Range   Phosphorus 3.2 2.5 - 4.6 mg/dL    Comment: Performed at [facility] [address]  [place], [place] [address]  Ferritin  Status: Abnormal   Collection Time: 12/03/2019  4:37 PM  Result Value Ref Range   Ferritin 7,361 (H) 24 - 336 ng/mL    Comment: Performed at [facility], [place] [address], [place], [place] [address]  Iron and TIBC     Status: Abnormal   Collection Time: 12/03/2019  4:37 PM  Result Value Ref Range   Iron 65 45 - 182 ug/dL   TIBC 181 (L) 250 - 450 ug/dL   Saturation Ratios 36 17.9 - 39.5 %   UIBC 116 ug/dL    Comment: Performed at [facility] [address] [place], [place] [address]  Hemoglobin A1c     Status: Abnormal   Collection Time: 11/27/2019  5:14 PM  Result Value Ref Range   Hgb A1c MFr Bld 6.1 (H) 4.8 - 5.6 %    Comment: (NOTE) Pre diabetes:          5.7%-6.4%  Diabetes:              >6.4%  Glycemic control for   <7.0% adults with diabetes    Mean Plasma Glucose 128.37 mg/dL    Comment: Performed at [facility] [address], [place], [place] [address]  Lipid panel     Status: Abnormal   Collection Time: 12/06/2019  5:14 PM  Result Value Ref Range   Cholesterol 140 0 - 200 mg/dL   Triglycerides 176 (H) <150 mg/dL   HDL 23 (L) >40 mg/dL   Total CHOL/HDL Ratio 6.1 RATIO   VLDL 35 0 - 40 mg/dL   LDL Cholesterol 82 0 - 99 mg/dL    Comment:        Total Cholesterol/HDL:CHD Risk Coronary Heart Disease Risk Table                     Men   Women  1/2 Average Risk   3.4   3.3  Average Risk       5.0   4.4  2 X Average Risk   9.6   7.1  3 X Average Risk  23.4   11.0        Use the calculated Patient Ratio above and the CHD Risk Table to determine the patient's CHD Risk.        ATP III CLASSIFICATION (LDL):  <100     mg/dL   Optimal  100-129  mg/dL   Near or Above                    Optimal  130-159  mg/dL   Borderline  160-189  mg/dL   High  >190     mg/dL   Very High Performed at [facility] [address] [place], [place] [address]   Comprehensive metabolic panel     Status: Abnormal   Collection  Time: 12/17/19  5:36 AM  Result Value Ref Range   Sodium 137 135 - 145 mmol/L   Potassium 3.0 (L) 3.5 - 5.1 mmol/L   Chloride 95 (L) 98 - 111 mmol/L   CO2 26 22 - 32 mmol/L   Glucose, Bld 68 (L) 70 - 99 mg/dL    Comment: Glucose reference range applies only to samples taken after fasting for at least 8 hours.   BUN 6 6 - 20 mg/dL   Creatinine, Ser 0.70 0.61 - 1.24 mg/dL   Calcium 10.0 8.9 - 10.3 mg/dL   Total Protein 6.2 (L)  6.5 - 8.1 g/dL   Albumin 2.0 (L) 3.5 - 5.0 g/dL   AST 26 15 - 41 U/L   ALT 12 0 - 44 U/L   Alkaline Phosphatase 159 (H) 38 - 126 U/L   Total Bilirubin 1.5 (H) 0.3 - 1.2 mg/dL   GFR calc non Af Amer >60 >60 mL/min   GFR calc Af Amer >60 >60 mL/min   Anion gap 16 (H) 5 - 15    Comment: Performed at [facility] [address] [place], [place] [address]  CBC     Status: Abnormal   Collection Time: 12/17/19  5:36 AM  Result Value Ref Range   WBC 9.0 4.0 - 10.5 K/uL   RBC 3.14 (L) 4.22 - 5.81 MIL/uL   Hemoglobin 9.4 (L) 13.0 - 17.0 g/dL   HCT 28.9 (L) 39 - 52 %   MCV 92.0 80.0 - 100.0 fL   MCH 29.9 26.0 - 34.0 pg   MCHC 32.5 30.0 - 36.0 g/dL   RDW 14.0 11.5 - 15.5 %   Platelets 136 (L) 150 - 400 K/uL   nRBC 0.4 (H) 0.0 - 0.2 %    Comment: Performed at [facility] [address] [place], [place] [address]  Magnesium     Status: Abnormal   Collection Time: 12/17/19  5:36 AM  Result Value Ref Range   Magnesium 1.6 (L) 1.7 - 2.4 mg/dL    Comment: Performed at [facility] [address], [place], [place] [address]  Glucose, capillary     Status: Abnormal   Collection Time: 12/17/19  7:34 AM  Result Value Ref Range   Glucose-Capillary 69 (L) 70 - 99 mg/dL    Comment: Glucose reference range applies only to samples taken after fasting for at least 8 hours.  Glucose, capillary     Status: Abnormal   Collection Time: 12/17/19  8:44 AM  Result Value Ref Range   Glucose-Capillary 131 (H) 70 - 99 mg/dL    Comment: Glucose reference range  applies only to samples taken after fasting for at least 8 hours.   CT HEAD WO CONTRAST  Result Date: 12/05/2019 CLINICAL DATA:  Slurred speech for the past 3 days with gait disturbance since yesterday. History of 2 previous strokes. EXAM: CT HEAD WITHOUT CONTRAST TECHNIQUE: Contiguous axial images were obtained from the base of the skull through the vertex without intravenous contrast. COMPARISON:  None. FINDINGS: Brain: Mildly enlarged ventricles and cortical sulci. Mild patchy white matter low density in both cerebral hemispheres. Old right corona radiata lacunar infarct. No intracranial hemorrhage, mass lesion or CT evidence of acute infarction. Vascular: No hyperdense vessel or unexpected calcification. Skull: Normal. Negative for fracture or focal lesion. Sinuses/Orbits: Mild to moderate left maxillary sinus mucosal thickening and minimal right maxillary sinus mucosal thickening. Right ethmoid sinus rounded metallic foreign body compatible with a BB. Other: None. IMPRESSION: 1. No acute abnormality. 2. Mild diffuse cerebral and cerebellar atrophy. 3. Mild chronic small vessel white matter ischemic changes in both cerebral hemispheres. 4. Old right corona radiata lacunar infarct. 5. Mild chronic bilateral maxillary sinusitis. 6. Right ethmoid sinus BB. Electronically Signed   By: Claudie Revering M.D.   On: 12/19/2019 12:26   CT Chest W Contrast  Result Date: 12/17/2019 CLINICAL DATA:  Follow-up abnormal chest x-ray suggesting chest wall mass EXAM: CT CHEST, ABDOMEN, AND PELVIS WITH CONTRAST TECHNIQUE: Multidetector CT imaging of the chest, abdomen and pelvis was performed following the  standard protocol during bolus administration of intravenous contrast. CONTRAST:  163m OMNIPAQUE IOHEXOL 300 MG/ML  SOLN COMPARISON:  Film from earlier in the same day. FINDINGS: CT CHEST FINDINGS Cardiovascular: Atherosclerotic calcifications of the thoracic aorta are noted. No aneurysmal dilatation or dissection is seen.  No cardiac enlargement is noted. Pacing device is again seen. The pulmonary artery is enlarged without definitive central filling defect. Mediastinum/Nodes: Thoracic inlet is within normal limits. Scattered small hilar and mediastinal lymph nodes are noted. The esophagus appears within normal limits. Bilateral axillary adenopathy is identified the largest of these on the right measures 12 mm. The largest of these on the left measures 11 mm. Lungs/Pleura: Emphysematous changes are seen. The left lung is clear. The emphysematous changes are more marked on the right. This contributes to the somewhat lucent area seen on recent chest x-ray. No sizable effusion is seen. Enhancing pleural base lesions are seen throughout the right chest adjacent to the anterior aspect of the right fourth rib as well as the posterolateral aspect of the right seventh rib and posterior aspect of the right ninth rib. Given the multiplicity of pleural based lesions, metastatic disease deserves consideration. Musculoskeletal: Pathologic fracture is noted involving the posterolateral aspect of the right seventh rib secondary to the pleural based mass. This corresponds with the finding seen on recent chest x-ray. No other definitive pathologic fracture is seen. The vertebral bodies show no definitive compression deformity. CT ABDOMEN PELVIS FINDINGS Hepatobiliary: No focal liver abnormality is seen. No gallstones, gallbladder wall thickening, or biliary dilatation. Pancreas: Unremarkable. No pancreatic ductal dilatation or surrounding inflammatory changes. Spleen: Normal in size without focal abnormality. Adrenals/Urinary Tract: Adrenal glands are within normal limits. Kidneys demonstrate a normal enhancement pattern bilaterally. No renal calculi or obstructive changes are seen. Kidneys demonstrate a normal excretion of contrast bilaterally on delayed images. The bladder is partially decompressed. Stomach/Bowel: Colon shows no obstructive or  inflammatory changes. The appendix is well visualized and within normal limits. No small bowel or gastric abnormality is noted. Vascular/Lymphatic: Atherosclerotic calcifications of the abdominal aorta are seen without definitive aneurysmal dilatation. Considerable retroperitoneal adenopathy is noted in the Peri aortic and intra-aortocaval region. The largest area is noted adjacent to aortic bifurcation measuring approximately 4.3 x 1.9 cm in greatest dimension. No significant inguinal adenopathy is noted. Bilateral common iliac adenopathy is seen as well. Reproductive: Prostate is unremarkable. Other: Minimal free fluid is noted which may be physiologic in nature. Musculoskeletal: Degenerative changes of lumbar spine are noted. There is a moth-eaten pattern identified within the right iliac bone adjacent to the sacroiliac joint as well as an area of bony destruction in the left half of the sacrum best seen on image number 105 of series 4. Soft tissue nodule is noted in the right buttock best seen on image number 85 of series 4 which measures 17 mm in greatest dimension. This may be related to the underlying diffuse process although may be incidental. IMPRESSION: Constellation of findings as described above consistent with widespread neoplastic involvement. Given the lymphadenopathy in the possibility of lymphoma is felt to be most likely. Node biopsy would be helpful. Additionally PET-CT imaging is recommended for further evaluation. Electronically Signed   By: MInez CatalinaM.D.   On: 12/05/2019 15:13   CT ABDOMEN PELVIS W CONTRAST  Result Date: 11/26/2019 CLINICAL DATA:  Follow-up abnormal chest x-ray suggesting chest wall mass EXAM: CT CHEST, ABDOMEN, AND PELVIS WITH CONTRAST TECHNIQUE: Multidetector CT imaging of the chest, abdomen and pelvis was performed  following the standard protocol during bolus administration of intravenous contrast. CONTRAST:  171m OMNIPAQUE IOHEXOL 300 MG/ML  SOLN COMPARISON:  Film  from earlier in the same day. FINDINGS: CT CHEST FINDINGS Cardiovascular: Atherosclerotic calcifications of the thoracic aorta are noted. No aneurysmal dilatation or dissection is seen. No cardiac enlargement is noted. Pacing device is again seen. The pulmonary artery is enlarged without definitive central filling defect. Mediastinum/Nodes: Thoracic inlet is within normal limits. Scattered small hilar and mediastinal lymph nodes are noted. The esophagus appears within normal limits. Bilateral axillary adenopathy is identified the largest of these on the right measures 12 mm. The largest of these on the left measures 11 mm. Lungs/Pleura: Emphysematous changes are seen. The left lung is clear. The emphysematous changes are more marked on the right. This contributes to the somewhat lucent area seen on recent chest x-ray. No sizable effusion is seen. Enhancing pleural base lesions are seen throughout the right chest adjacent to the anterior aspect of the right fourth rib as well as the posterolateral aspect of the right seventh rib and posterior aspect of the right ninth rib. Given the multiplicity of pleural based lesions, metastatic disease deserves consideration. Musculoskeletal: Pathologic fracture is noted involving the posterolateral aspect of the right seventh rib secondary to the pleural based mass. This corresponds with the finding seen on recent chest x-ray. No other definitive pathologic fracture is seen. The vertebral bodies show no definitive compression deformity. CT ABDOMEN PELVIS FINDINGS Hepatobiliary: No focal liver abnormality is seen. No gallstones, gallbladder wall thickening, or biliary dilatation. Pancreas: Unremarkable. No pancreatic ductal dilatation or surrounding inflammatory changes. Spleen: Normal in size without focal abnormality. Adrenals/Urinary Tract: Adrenal glands are within normal limits. Kidneys demonstrate a normal enhancement pattern bilaterally. No renal calculi or obstructive  changes are seen. Kidneys demonstrate a normal excretion of contrast bilaterally on delayed images. The bladder is partially decompressed. Stomach/Bowel: Colon shows no obstructive or inflammatory changes. The appendix is well visualized and within normal limits. No small bowel or gastric abnormality is noted. Vascular/Lymphatic: Atherosclerotic calcifications of the abdominal aorta are seen without definitive aneurysmal dilatation. Considerable retroperitoneal adenopathy is noted in the Peri aortic and intra-aortocaval region. The largest area is noted adjacent to aortic bifurcation measuring approximately 4.3 x 1.9 cm in greatest dimension. No significant inguinal adenopathy is noted. Bilateral common iliac adenopathy is seen as well. Reproductive: Prostate is unremarkable. Other: Minimal free fluid is noted which may be physiologic in nature. Musculoskeletal: Degenerative changes of lumbar spine are noted. There is a moth-eaten pattern identified within the right iliac bone adjacent to the sacroiliac joint as well as an area of bony destruction in the left half of the sacrum best seen on image number 105 of series 4. Soft tissue nodule is noted in the right buttock best seen on image number 85 of series 4 which measures 17 mm in greatest dimension. This may be related to the underlying diffuse process although may be incidental. IMPRESSION: Constellation of findings as described above consistent with widespread neoplastic involvement. Given the lymphadenopathy in the possibility of lymphoma is felt to be most likely. Node biopsy would be helpful. Additionally PET-CT imaging is recommended for further evaluation. Electronically Signed   By: MInez CatalinaM.D.   On: 12/09/2019 15:13   DG Chest Portable 1 View  Result Date: 12/01/2019 CLINICAL DATA:  54year old male with history of shortness of breath. EXAM: PORTABLE CHEST 1 VIEW COMPARISON:  Chest x-ray 11/04/2019. FINDINGS: Previously noted right upper lobe  nodule  is less apparent on today's examination. There is an increasing vague masslike density projecting over the lateral aspect of the right mid hemithorax. Probable expansile lytic lesion in the posterolateral aspect of the right seventh rib where there is a nondisplaced fracture. Left lung is clear. No pleural effusions. No evidence of pulmonary edema. Heart size is normal. Upper mediastinal contours are within normal limits. 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 IMPRESSION: 1. Unusual findings in the right hemithorax concerning for potential malignancy. Specifically, there appears to be an expansile lytic lesion in the posterolateral right seventh rib with probable nondisplaced pathologic fracture and adjacent ill-defined opacity in the right mid hemithorax. These findings should be better evaluated with follow-up contrast enhanced chest CT in the near future to exclude underlying malignancy. Electronically Signed   By: Vinnie Langton M.D.   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 Hx depression Severe calorie malnutrition Hypokalemia Hx tobacco use  Slurred speech, abnormal gait, weight loss  - possible new stroke - neurology w/u in progress Widespread lymphadenopathy, degenerative changes lumbar spine, and right iliac Right seventh rib pathologic fracture Pathologic changes right 4th,7th and 9th ribs Bilateral axillary lymphadenopathy  FEN: D3 diet ID:  None DVT: SCD's F/U:  TBD  Plan:  He will need to be seen and cleared by neurology and cardiology.  Will review with Dr. Grandville Silos, I could not feel the lymph nodes reported on CT.  Dr. Ninfa Linden is on next week, and we will try to see what we can do.     Earnstine Regal [facility] 12/17/2019, 11:12 AM Please see Amion for pager  number during day hours 7:00am-4:30pm

## 2019-12-17 NOTE — Progress Notes (Signed)
Hypoglycemic Event  CBG: 69  Treatment: D50 25 mL (12.5 gm)  Symptoms: None  Follow-up CBG: [id number] CBG Result: 131  Possible Reasons for Event: Inadequate meal intake, Pt NPO . Prior to admission pt states he has not ate in 3-4 days due to current admission issues.   Comments/MD notified:Attending [place]

## 2019-12-17 NOTE — Progress Notes (Signed)
PROGRESS NOTE    Mike Holland  LNL:[id number] DOB: 1965/08/18 DOA: 12/13/2019 PCP: Gildardo Pounds, NP     Brief Narrative:  Mike Holland is a 54 y.o. BM PMHx HIV-diagnosed in 1996, CVA, HTN, HLD, Depression, Tachybradycardia syndrome?    S/p Medtronic Pacemaker placement in July 2018   Presents to emergency department with slurred speech and shuffling gait since 3 days.   Patient tells me that he has history of CVA.  He started having slurred speech and shuffling gait/difficulty in walking since 3 days.  He tells me that today his symptoms are getting better.  He denies headache, blurry vision, facial drop, seizures, loss of consciousness, lightheadedness, dizziness, numbness weakness tingling sensation in hands or feet, fall or head trauma.  Reports unintentional weight loss of 20 pounds in 1 month, generalized weakness, lethargic, decreased appetite.  He is followed by infectious disease outpatient for history of HIV.  He was seen by Dr. Belenda Cruise on 7/20-Genvoya was discontinued due to weight loss and was started on Biktarvy and megestrol.  He takes Valtrex for prophylaxis.  He denies fever, chills, nausea, vomiting, diarrhea, abdominal pain, chest pain, shortness of breath, palpitation, leg swelling, urinary or sleep changes.  He tells me that he has chronic constipation and has bowel movement every 4 to 5 days.  He lives alone at home.  Moved from [place] on November 2020.  Denies cigarette smoking, alcohol however uses marijuana 3 times daily to help with appetite.  He tells me pacemaker was placed in July 2018 due to irregular heart rate.  ED Course: Upon arrival to ED: Patient slightly tachycardic with heart rate in 105, afebrile, maintaining oxygen saturation on room air, WBC: 12.1, H&H: 9.9/31.1, ethanol: WNL, CMP shows potassium of 2.7, calcium: 10.6, UDS positive for marijuana, UA negative for infection.  Magnesium: 1.3.  CT head came back negative for acute findings.   MRI was not obtained due to pacemaker.  Neurology consulted.  Chest x-ray, CT abdomen/CT chest was obtained which shows findings consistent with widespread neoplastic involvement-concerning for lymphoma.  EDP consulted oncology.  [facility] hospitalist consulted for admission.  Apparently: Patient's pacemaker is conditional for MRI and will need one of the trained MRI technician to change pacemaker settings into MRI safe mode however there is no staff available today that are trained to do this.    Subjective: A/O x4, negative CP, negative S OB, negative abdominal pain.  Extremely weak.   Assessment & Plan: Covid vaccination; no vaccination   Principal Problem:   Widespread metastatic malignant neoplastic disease ([place]) Active Problems:   HIV disease ([place])   Essential hypertension   Depression   Unintentional weight loss   HLD (hyperlipidemia)   Pacemaker   Hypokalemia   Hypomagnesemia   Normocytic anemia   Marijuana abuse   Slurred speech  Slurred speech/shuffling gait -Negative slurred speech -Did not attempt to ambulate patient -Hx CVA in the past, CT head negative for acute findings.  Unable to obtain MRI due to pacer.  -Per EMR neurology consulted by EDP.  No further stroke work-up warranted at this time given patient's findings on abdominal/chest CT for metastatic neoplasm. -After discussing case with cardiology hold off on MRI until Dr. Jens Som EP has a chance to evaluate patient.  We will then reorder MRI if he deems safe.  Prediabetes -7/23 hemoglobin A1c= 6.1, patient meets guidelines for prediabetes -Sensitive SSI for now -Prior to discharge will start him on Metformin  HLD -Atorvastatin 20 mg  daily -Lipid panel pending   Widespread neoplasm -CT abdomen/pelvis concerning for lymphoma.  Discussed case with Dr. Narda [facility] who is agreed to evaluate patient today. -Per oncology recommendations have ordered LDH, Fflow Cytometry -Have requested [facility]  perform excisional biopsy of RIGHT axillary lymph node per oncology request.  PA Will from [facility] has agreed to see patient today  RIGHT seventh rib pathologic fracture -Likely secondary to malignancy/lymphoma -Currently negative pain  HIV -Dr. Belenda Cruise from ID follows patient.  Last seen 7/20  - Biktarvy 50-2 100-25 mg 1 tablet daily -Valtrex for prophylaxis (not taking per patient)  Tachybradycardia syndrome -S/p Medtronic Pacemaker placement  2018 July   Essential HTN/Hypotensive -Patient's BP on the low side, appears secondary to dehydration and malnutrition -Norvasc 10 mg daily -Toprol 25 mg daily -Albumin 50 g -D5-0.9% saline 19ml/hr  Normocytic anemia: -H&H dropped from 11.2/33.3-9.9/31.3 in 1 month. -Occult blood pending -Anemia panel pending  Hypokalemia -Potassium goal > 4 -K-Dur 40 mEq x 2    Hypomagnesmia -Magnesium goal > 2 -Magnesium IV 3 g   Marijuana abuse -Patient tells me that he uses marijuana 3 times daily to help with his appetite.  Oral candidiasis -Likely secondary to HIV -Nystatin QID  Moderate to severe protein calorie malnutrition -: BMI of 19 -History of unintentional weight loss.   -Consult nutritionist -Megace 800 mg daily  Depression/Bipolar:  -Patient not on medication for depression or bipolar. -Continue patient's antipsychotic medication risperidone 2 mg daily   Goals of care -7/24 palliative care consult; patient with HIV, metastatic neoplasm change of CODE STATUS, short and long-term goals of care   DVT prophylaxis: Lovenox Code Status: Full Family Communication:  Status is: Inpatient    Dispo: The patient is from: Home              Anticipated d/c is to: Home              Anticipated d/c date is: 7/30              Patient currently unstable      Consultants:  Medical oncology Dr. Narda Rutherford [facility] PA Will Palliative care   Procedures/Significant Events:  CT chest W contrast; Cardiovascular:  Atherosclerotic calcifications of the thoracic aorta are noted. No aneurysmal dilatation or dissection is seen. No cardiac enlargement is noted. Pacing device is again seen. The pulmonary artery is enlarged without definitive central filling defect.  Mediastinum/Nodes: Thoracic inlet is within normal limits. Scattered small hilar and mediastinal lymph nodes are noted. The esophagus appears within normal limits. Bilateral axillary adenopathy is identified the largest of these on the right measures 12 mm. The largest of these on the left measures 11 mm.  Lungs/Pleura: Emphysematous changes are seen. The left lung is clear. The emphysematous changes are more marked on the right. This contributes to the somewhat lucent area seen on recent chest x-ray. No sizable effusion is seen. Enhancing pleural base lesions are seen throughout the right chest adjacent to the anterior aspect of the right fourth rib as well as the posterolateral aspect of the right seventh rib and posterior aspect of the right ninth rib. Given the multiplicity of pleural based lesions, metastatic disease deserves consideration.  Musculoskeletal: Pathologic fracture is noted involving the posterolateral aspect of the right seventh rib secondary to the pleural based mass. This corresponds with the finding seen on recent chest x-ray. No other definitive pathologic fracture is seen. The vertebral bodies show no definitive compression deformity.  CT ABDOMEN  PELVIS FINDINGS  Hepatobiliary: No focal liver abnormality is seen. No gallstones, gallbladder wall thickening, or biliary dilatation.  Pancreas: Unremarkable. No pancreatic ductal dilatation or surrounding inflammatory changes.  Spleen: Normal in size without focal abnormality.  Adrenals/Urinary Tract: Adrenal glands are within normal limits. Kidneys demonstrate a normal enhancement pattern bilaterally. No renal calculi or obstructive changes are seen.  Kidneys demonstrate a normal excretion of contrast bilaterally on delayed images. The bladder is partially decompressed.  Stomach/Bowel: Colon shows no obstructive or inflammatory changes. The appendix is well visualized and within normal limits. No small bowel or gastric abnormality is noted.  Vascular/Lymphatic: Atherosclerotic calcifications of the abdominal aorta are seen without definitive aneurysmal dilatation. Considerable retroperitoneal adenopathy is noted in the Peri aortic and intra-aortocaval region. The largest area is noted adjacent to aortic bifurcation measuring approximately 4.3 x 1.9 cm in greatest dimension. No significant inguinal adenopathy is noted. Bilateral common iliac adenopathy is seen as well.  Reproductive: Prostate is unremarkable.  Other: Minimal free fluid is noted which may be physiologic in nature.  Musculoskeletal: Degenerative changes of lumbar spine are noted. There is a moth-eaten pattern identified within the right iliac bone adjacent to the sacroiliac joint as well as an area of bony destruction in the left half of the sacrum best seen on image number 105 of series 4. Soft tissue nodule is noted in the right buttock best seen on image number 85 of series 4 which measures 17 mm in greatest dimension. This may be related to the underlying diffuse process although may be incidental.  IMPRESSION: Constellation of findings as described above consistent with widespread neoplastic involvement. Given the lymphadenopathy in the possibility of lymphoma is felt to be most likely. Node biopsy would be helpful. Additionally PET-CT imaging is recommended for further evaluation.  I have personally reviewed and interpreted all radiology studies and my findings are as above.  VENTILATOR SETTINGS:    Cultures   Antimicrobials:    Devices Permanent pacemaker 2018>>>>   LINES / TUBES:      Continuous  Infusions:   Objective: Vitals:   12/17/19 0327 12/17/19 0400 12/17/19 0625 12/17/19 0723  BP: 103/72 104/74 111/79 108/77  Pulse: 102 96 (!) 107 96  Resp: (!) 24 21 22    Temp: 98.1 F (36.7 C)   97.6 F (36.4 C)  TempSrc: Oral   Oral  SpO2: 96% 95% 97% 97%  Weight:        Intake/Output Summary (Last 24 hours) at 12/17/2019 0841 Last data filed at 12/17/2019 0300 Gross per 24 hour  Intake --  Output 150 ml  Net -150 ml   Filed Weights   11/30/2019 1130 11/30/2019 2152  Weight: 55.3 kg 54.9 kg    Examination:  General: A/O x4, No acute respiratory distress, cachectic Eyes: negative scleral hemorrhage, negative anisocoria, negative icterus ENT: Negative Runny nose, negative gingival bleeding, Neck:  Negative scars, masses, torticollis, lymphadenopathy, JVD Lungs: Clear to auscultation bilaterally without wheezes or crackles Cardiovascular: Tachycardic, negative murmur gallop or rub normal S1 and S2, pacemaker left chest wall Abdomen: negative abdominal pain, nondistended, positive soft, bowel sounds, no rebound, no ascites, no appreciable mass Extremities: No significant cyanosis, clubbing, or edema bilateral lower extremities Skin: Negative rashes, lesions, ulcers Psychiatric:  Negative depression, negative anxiety, negative fatigue, negative mania  Central nervous system:  Cranial nerves II through XII intact, tongue/uvula midline, all extremities muscle strength 5/5, sensation intact throughout, negative dysarthria, negative expressive aphasia, negative receptive aphasia.  Marland Kitchen  Data Reviewed: Care during the described time interval was provided by me .  I have reviewed this patient's available data, including medical history, events of note, physical examination, and all test results as part of my evaluation.  CBC: Recent Labs  Lab 12/13/19 1052 12/10/2019 1135 12/18/2019 1637 12/17/19 0536  WBC 12.1* 12.1* 10.4 9.0  NEUTROABS  --  7.4  --   --   HGB 11.0* 9.9* 9.2*  9.4*  HCT 32.9* 31.3* 28.9* 28.9*  MCV 89.9 92.3 92.0 92.0  PLT 196 156 140* 124*   Basic Metabolic Panel: Recent Labs  Lab 12/13/19 1053 12/22/2019 1135 12/13/2019 1300 11/28/2019 1637 12/17/19 0536  NA 132* 134*  --   --  137  K 3.1* 2.7*  --   --  3.0*  CL 88* 93*  --   --  95*  CO2 26 24  --   --  26  GLUCOSE 116* 113*  --   --  68*  BUN 17 7  --   --  6  CREATININE 0.76 0.71  --  0.58* 0.70  CALCIUM 9.9 10.6*  --   --  10.0  MG  --   --  1.3*  --  1.6*  PHOS  --   --   --  3.2  --    GFR: Estimated Creatinine Clearance: 82.9 mL/min (by C-G formula based on SCr of 0.7 mg/dL). Liver Function Tests: Recent Labs  Lab 12/13/19 1053 12/01/2019 1135 12/17/19 0536  AST 23 29 26   ALT 12 13 12   ALKPHOS  --  183* 159*  BILITOT 0.8 0.9 1.5*  PROT 7.1 6.8 6.2*  ALBUMIN  --  2.2* 2.0*   No results for input(s): LIPASE, AMYLASE in the last 168 hours. No results for input(s): AMMONIA in the last 168 hours. Coagulation Profile: Recent Labs  Lab 12/01/2019 1135  INR 1.4*   Cardiac Enzymes: No results for input(s): CKTOTAL, CKMB, CKMBINDEX, TROPONINI in the last 168 hours. BNP (last 3 results) No results for input(s): PROBNP in the last 8760 hours. HbA1C: Recent Labs    12/20/2019 1714  HGBA1C 6.1*   CBG: Recent Labs  Lab 12/17/19 0734  GLUCAP 69*   Lipid Profile: Recent Labs    12/08/2019 1714  CHOL 140  HDL 23*  LDLCALC 82  TRIG 176*  CHOLHDL 6.1   Thyroid Function Tests: No results for input(s): TSH, T4TOTAL, FREET4, T3FREE, THYROIDAB in the last 72 hours. Anemia Panel: Recent Labs    11/24/2019 1637  FERRITIN 7,361*  TIBC 181*  IRON 65   Sepsis Labs: No results for input(s): PROCALCITON, LATICACIDVEN in the last 168 hours.  Recent Results (from the past 240 hour(s))  SARS Coronavirus 2 by RT PCR (hospital order, performed in [facility] hospital lab) Nasopharyngeal Nasopharyngeal Swab     Status: None   Collection Time: 11/25/2019  3:08 PM   Specimen:  Nasopharyngeal Swab  Result Value Ref Range Status   SARS Coronavirus 2 NEGATIVE NEGATIVE Final    Comment: (NOTE) SARS-CoV-2 target nucleic acids are NOT DETECTED.  The SARS-CoV-2 RNA is generally detectable in upper and lower respiratory specimens during the acute phase of infection. The lowest concentration of SARS-CoV-2 viral copies this assay can detect is 250 copies / mL. A negative result does not preclude SARS-CoV-2 infection and should not be used as the sole basis for treatment or other patient management decisions.  A negative result may occur with improper specimen collection /  handling, submission of specimen other than nasopharyngeal swab, presence of viral mutation(s) within the areas targeted by this assay, and inadequate number of viral copies (<250 copies / mL). A negative result must be combined with clinical observations, patient history, and epidemiological information.  Fact Sheet for Patients:   StrictlyIdeas.no  Fact Sheet for Healthcare Providers: BankingDealers.co.za  This test is not yet approved or  cleared by the [place] FDA and has been authorized for detection and/or diagnosis of SARS-CoV-2 by FDA under an Emergency Use Authorization (EUA).  This EUA will remain in effect (meaning this test can be used) for the duration of the COVID-19 declaration under Section 564(b)(1) of the Act, 21 U.S.C. section 360bbb-3(b)(1), unless the authorization is terminated or revoked sooner.  Performed at [facility], [place] [address], [place], [place] [address]          Radiology Studies: CT HEAD WO CONTRAST  Result Date: 12/12/2019 CLINICAL DATA:  Slurred speech for the past 3 days with gait disturbance since yesterday. History of 2 previous strokes. EXAM: CT HEAD WITHOUT CONTRAST TECHNIQUE: Contiguous axial images were obtained from the base of the skull through the vertex without intravenous contrast.  COMPARISON:  None. FINDINGS: Brain: Mildly enlarged ventricles and cortical sulci. Mild patchy white matter low density in both cerebral hemispheres. Old right corona radiata lacunar infarct. No intracranial hemorrhage, mass lesion or CT evidence of acute infarction. Vascular: No hyperdense vessel or unexpected calcification. Skull: Normal. Negative for fracture or focal lesion. Sinuses/Orbits: Mild to moderate left maxillary sinus mucosal thickening and minimal right maxillary sinus mucosal thickening. Right ethmoid sinus rounded metallic foreign body compatible with a BB. Other: None. IMPRESSION: 1. No acute abnormality. 2. Mild diffuse cerebral and cerebellar atrophy. 3. Mild chronic small vessel white matter ischemic changes in both cerebral hemispheres. 4. Old right corona radiata lacunar infarct. 5. Mild chronic bilateral maxillary sinusitis. 6. Right ethmoid sinus BB. Electronically Signed   By: Claudie Revering M.D.   On: 12/12/2019 12:26   CT Chest W Contrast  Result Date: 12/15/2019 CLINICAL DATA:  Follow-up abnormal chest x-ray suggesting chest wall mass EXAM: CT CHEST, ABDOMEN, AND PELVIS WITH CONTRAST TECHNIQUE: Multidetector CT imaging of the chest, abdomen and pelvis was performed following the standard protocol during bolus administration of intravenous contrast. CONTRAST:  172mL OMNIPAQUE IOHEXOL 300 MG/ML  SOLN COMPARISON:  Film from earlier in the same day. FINDINGS: CT CHEST FINDINGS Cardiovascular: Atherosclerotic calcifications of the thoracic aorta are noted. No aneurysmal dilatation or dissection is seen. No cardiac enlargement is noted. Pacing device is again seen. The pulmonary artery is enlarged without definitive central filling defect. Mediastinum/Nodes: Thoracic inlet is within normal limits. Scattered small hilar and mediastinal lymph nodes are noted. The esophagus appears within normal limits. Bilateral axillary adenopathy is identified the largest of these on the right measures 12  mm. The largest of these on the left measures 11 mm. Lungs/Pleura: Emphysematous changes are seen. The left lung is clear. The emphysematous changes are more marked on the right. This contributes to the somewhat lucent area seen on recent chest x-ray. No sizable effusion is seen. Enhancing pleural base lesions are seen throughout the right chest adjacent to the anterior aspect of the right fourth rib as well as the posterolateral aspect of the right seventh rib and posterior aspect of the right ninth rib. Given the multiplicity of pleural based lesions, metastatic disease deserves consideration. Musculoskeletal: Pathologic fracture is noted involving the posterolateral aspect of the right  seventh rib secondary to the pleural based mass. This corresponds with the finding seen on recent chest x-ray. No other definitive pathologic fracture is seen. The vertebral bodies show no definitive compression deformity. CT ABDOMEN PELVIS FINDINGS Hepatobiliary: No focal liver abnormality is seen. No gallstones, gallbladder wall thickening, or biliary dilatation. Pancreas: Unremarkable. No pancreatic ductal dilatation or surrounding inflammatory changes. Spleen: Normal in size without focal abnormality. Adrenals/Urinary Tract: Adrenal glands are within normal limits. Kidneys demonstrate a normal enhancement pattern bilaterally. No renal calculi or obstructive changes are seen. Kidneys demonstrate a normal excretion of contrast bilaterally on delayed images. The bladder is partially decompressed. Stomach/Bowel: Colon shows no obstructive or inflammatory changes. The appendix is well visualized and within normal limits. No small bowel or gastric abnormality is noted. Vascular/Lymphatic: Atherosclerotic calcifications of the abdominal aorta are seen without definitive aneurysmal dilatation. Considerable retroperitoneal adenopathy is noted in the Peri aortic and intra-aortocaval region. The largest area is noted adjacent to aortic  bifurcation measuring approximately 4.3 x 1.9 cm in greatest dimension. No significant inguinal adenopathy is noted. Bilateral common iliac adenopathy is seen as well. Reproductive: Prostate is unremarkable. Other: Minimal free fluid is noted which may be physiologic in nature. Musculoskeletal: Degenerative changes of lumbar spine are noted. There is a moth-eaten pattern identified within the right iliac bone adjacent to the sacroiliac joint as well as an area of bony destruction in the left half of the sacrum best seen on image number 105 of series 4. Soft tissue nodule is noted in the right buttock best seen on image number 85 of series 4 which measures 17 mm in greatest dimension. This may be related to the underlying diffuse process although may be incidental. IMPRESSION: Constellation of findings as described above consistent with widespread neoplastic involvement. Given the lymphadenopathy in the possibility of lymphoma is felt to be most likely. Node biopsy would be helpful. Additionally PET-CT imaging is recommended for further evaluation. Electronically Signed   By: Inez Catalina M.D.   On: 12/13/2019 15:13   CT ABDOMEN PELVIS W CONTRAST  Result Date: 12/18/2019 CLINICAL DATA:  Follow-up abnormal chest x-ray suggesting chest wall mass EXAM: CT CHEST, ABDOMEN, AND PELVIS WITH CONTRAST TECHNIQUE: Multidetector CT imaging of the chest, abdomen and pelvis was performed following the standard protocol during bolus administration of intravenous contrast. CONTRAST:  154mL OMNIPAQUE IOHEXOL 300 MG/ML  SOLN COMPARISON:  Film from earlier in the same day. FINDINGS: CT CHEST FINDINGS Cardiovascular: Atherosclerotic calcifications of the thoracic aorta are noted. No aneurysmal dilatation or dissection is seen. No cardiac enlargement is noted. Pacing device is again seen. The pulmonary artery is enlarged without definitive central filling defect. Mediastinum/Nodes: Thoracic inlet is within normal limits. Scattered  small hilar and mediastinal lymph nodes are noted. The esophagus appears within normal limits. Bilateral axillary adenopathy is identified the largest of these on the right measures 12 mm. The largest of these on the left measures 11 mm. Lungs/Pleura: Emphysematous changes are seen. The left lung is clear. The emphysematous changes are more marked on the right. This contributes to the somewhat lucent area seen on recent chest x-ray. No sizable effusion is seen. Enhancing pleural base lesions are seen throughout the right chest adjacent to the anterior aspect of the right fourth rib as well as the posterolateral aspect of the right seventh rib and posterior aspect of the right ninth rib. Given the multiplicity of pleural based lesions, metastatic disease deserves consideration. Musculoskeletal: Pathologic fracture is noted involving the posterolateral aspect of  the right seventh rib secondary to the pleural based mass. This corresponds with the finding seen on recent chest x-ray. No other definitive pathologic fracture is seen. The vertebral bodies show no definitive compression deformity. CT ABDOMEN PELVIS FINDINGS Hepatobiliary: No focal liver abnormality is seen. No gallstones, gallbladder wall thickening, or biliary dilatation. Pancreas: Unremarkable. No pancreatic ductal dilatation or surrounding inflammatory changes. Spleen: Normal in size without focal abnormality. Adrenals/Urinary Tract: Adrenal glands are within normal limits. Kidneys demonstrate a normal enhancement pattern bilaterally. No renal calculi or obstructive changes are seen. Kidneys demonstrate a normal excretion of contrast bilaterally on delayed images. The bladder is partially decompressed. Stomach/Bowel: Colon shows no obstructive or inflammatory changes. The appendix is well visualized and within normal limits. No small bowel or gastric abnormality is noted. Vascular/Lymphatic: Atherosclerotic calcifications of the abdominal aorta are seen  without definitive aneurysmal dilatation. Considerable retroperitoneal adenopathy is noted in the Peri aortic and intra-aortocaval region. The largest area is noted adjacent to aortic bifurcation measuring approximately 4.3 x 1.9 cm in greatest dimension. No significant inguinal adenopathy is noted. Bilateral common iliac adenopathy is seen as well. Reproductive: Prostate is unremarkable. Other: Minimal free fluid is noted which may be physiologic in nature. Musculoskeletal: Degenerative changes of lumbar spine are noted. There is a moth-eaten pattern identified within the right iliac bone adjacent to the sacroiliac joint as well as an area of bony destruction in the left half of the sacrum best seen on image number 105 of series 4. Soft tissue nodule is noted in the right buttock best seen on image number 85 of series 4 which measures 17 mm in greatest dimension. This may be related to the underlying diffuse process although may be incidental. IMPRESSION: Constellation of findings as described above consistent with widespread neoplastic involvement. Given the lymphadenopathy in the possibility of lymphoma is felt to be most likely. Node biopsy would be helpful. Additionally PET-CT imaging is recommended for further evaluation. Electronically Signed   By: Inez Catalina M.D.   On: 12/03/2019 15:13   DG Chest Portable 1 View  Result Date: 12/07/2019 CLINICAL DATA:  54 year old male with history of shortness of breath. EXAM: PORTABLE CHEST 1 VIEW COMPARISON:  Chest x-ray 11/04/2019. FINDINGS: Previously noted right upper lobe nodule is less apparent on today's examination. There is an increasing vague masslike density projecting over the lateral aspect of the right mid hemithorax. Probable expansile lytic lesion in the posterolateral aspect of the right seventh rib where there is a nondisplaced fracture. Left lung is clear. No pleural effusions. No evidence of pulmonary edema. Heart size is normal. Upper mediastinal  contours are within normal limits. 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 IMPRESSION: 1. Unusual findings in the right hemithorax concerning for potential malignancy. Specifically, there appears to be an expansile lytic lesion in the posterolateral right seventh rib with probable nondisplaced pathologic fracture and adjacent ill-defined opacity in the right mid hemithorax. These findings should be better evaluated with follow-up contrast enhanced chest CT in the near future to exclude underlying malignancy. Electronically Signed   By: Vinnie Langton M.D.   On: 12/01/2019 12:43        Scheduled Meds: . amLODipine  10 mg Oral Daily  . atorvastatin  20 mg Oral Daily  . bictegravir-emtricitabine-tenofovir AF  1 tablet Oral Daily  . enoxaparin (LOVENOX) injection  40 mg Subcutaneous Q24H  . folic acid  1 mg Oral QPM  .  megestrol  800 mg Oral Daily  . metoprolol succinate  25 mg Oral QPM  . nystatin  5 mL Oral QID  . risperiDONE  2 mg Oral QHS   Continuous Infusions:   LOS: 1 day    Time spent:40 min    Ohana Birdwell, Geraldo Docker, MD [facility] Pager [phone]  If 7PM-7AM, please contact night-coverage [email or web address] Password [facility] 12/17/2019, 8:41 AM

## 2019-12-17 NOTE — Consult Note (Signed)
[place] [phone]   Fax:([phone] [place] NOTE  Patient Care Team: Gildardo Pounds, NP as PCP - General (Nurse Practitioner)  Hematological/Oncological History # Diffuse Lymphadenopathy, Concerning for Lymphoma 1) 11/29/2019: presented to the ED with symptoms of CVA. No CVA found, though CXR showed concern for adenopathy. CT C/A/P show diffuse lymphadenopathy including the axillary, hilar, and retroperitoneal lymph nodes, concerning for lymphoma 2) 12/17/2019: establish care with Dr. Lorenso Courier   CHIEF COMPLAINTS/PURPOSE OF CONSULTATION:  "Diffuse Lymphadenopathy "  HISTORY OF PRESENTING ILLNESS:  Mike Holland 54 y.o. male with medical history significant for HIV, HTN, pacemaker placement (11/2016) and depression who presents for a CVA and was incidentally found to have diffuse lymphadenopathy concerning for lymphoma.   On review of the previous records Mike Holland presented the emergency department on 12/12/2019 with 3 days of slurred speech and shuffling gait.  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  He had a more recent one approximately 2 years ago which was once again affecting his left side.  CT scan was performed the emergency department which showed no evidence of acute ischemic event or intracranial hemorrhage.  Due to concern for his low CD4 count and possible infection risk a plain film of the chest was performed which showed the unusual findings of potential malignancy, specifically an expansile lytic lesion in the posterior lateral seventh rib.  In order to follow this up a CT scan of the chest abdomen pelvis was performed which showed evidence of lymphadenopathy in the mediastinum, axillary lymph nodes, and retroperitoneum.  Hematology was consulted for further evaluation and management.  On exam today Mike Holland notes that he has not been having  any any recent issues with fevers, chills, sweats, nausea, vomiting, though he did have 24 hours of diarrhea approximately 3 days ago.  He notes that he has been compliant with his HIV medication and that he has not had any missed doses.  He reports that he has had 20 pounds of weight loss in a "very short period of time ", more specifically about the last 2 weeks.  He notes that he has not noticed any bumps or lumps or anything concerning for lymphadenopathy.  He is also had no other focal infectious symptoms.  He had no other comments or concerns at this time.  A full 10 point ROS is listed below.  MEDICAL HISTORY:  Past Medical History:  Diagnosis Date  . Depression   . HIV (human immunodeficiency virus infection) ([place])   . Hypertension   . Pacemaker     SURGICAL HISTORY: Past Surgical History:  Procedure Laterality Date  . PACEMAKER PLACEMENT      SOCIAL HISTORY: Social History   Socioeconomic History  . Marital status: Single    Spouse name: Not on file  . Number of children: 1  . Years of education: 75  . Highest education level: Not on file  Occupational History  . Occupation: Disability    Comment: Multiple reasons  Tobacco Use  . Smoking status: Former Smoker    Types: Cigars  . Smokeless tobacco: Never Used  . Tobacco comment: 1-2 black and milds per day   Vaping Use  . Vaping Use: Never used  Substance and Sexual Activity  . Alcohol use: Not Currently    Comment: holidays   . Drug use: Not Currently    Types: Marijuana  . Sexual  activity: Not Currently    Partners: Male    Birth control/protection: Condom    Comment: declined condoms  Other Topics Concern  . Not on file  Social History Narrative  . Not on file   Social Determinants of Health   Financial Resource Strain:   . Difficulty of Paying Living Expenses:   Food Insecurity:   . Worried About Charity fundraiser in the Last Year:   . Arboriculturist in the Last Year:   Transportation Needs:     . Film/video editor (Medical):   Marland Kitchen Lack of Transportation (Non-Medical):   Physical Activity:   . Days of Exercise per Week:   . Minutes of Exercise per Session:   Stress:   . Feeling of Stress :   Social Connections:   . Frequency of Communication with Friends and Family:   . Frequency of Social Gatherings with Friends and Family:   . Attends Religious Services:   . Active Member of Clubs or Organizations:   . Attends Archivist Meetings:   Marland Kitchen Marital Status:   Intimate Partner Violence:   . Fear of Current or Ex-Partner:   . Emotionally Abused:   Marland Kitchen Physically Abused:   . Sexually Abused:     FAMILY HISTORY: Family History  Problem Relation Age of Onset  . Heart disease Mother   . Heart disease Father     ALLERGIES:  has No Known Allergies.  MEDICATIONS:  Current Facility-Administered Medications  Medication Dose Route Frequency Provider Last Rate Last Admin  . acetaminophen (TYLENOL) tablet 650 mg  650 mg Oral Q6H PRN Pahwani, Rinka R, MD       Or  . acetaminophen (TYLENOL) suppository 650 mg  650 mg Rectal Q6H PRN Pahwani, Rinka R, MD      . albumin human 25 % solution 50 g  50 g Intravenous Once Allie Bossier, MD      . albuterol (PROVENTIL) (2.5 MG/3ML) 0.083% nebulizer solution 2.5 mg  2.5 mg Nebulization Q2H PRN Pahwani, Rinka R, MD      . amLODipine (NORVASC) tablet 10 mg  10 mg Oral Daily Pahwani, Rinka R, MD   10 mg at 12/01/2019 2051  . atorvastatin (LIPITOR) tablet 20 mg  20 mg Oral Daily Pahwani, Rinka R, MD   20 mg at 12/17/19 1142  . bictegravir-emtricitabine-tenofovir AF (BIKTARVY) 50-200-25 MG per tablet 1 tablet  1 tablet Oral Daily Pahwani, Rinka R, MD   1 tablet at 12/17/19 1143  . dextrose 5 %-0.9 % sodium chloride infusion   Intravenous Continuous Allie Bossier, MD      . enoxaparin (LOVENOX) injection 40 mg  40 mg Subcutaneous Q24H Pahwani, Rinka R, MD   40 mg at 12/08/2019 1713  . folic acid (FOLVITE) tablet 1 mg  1 mg Oral QPM  Pahwani, Rinka R, MD   1 mg at 12/20/2019 2051  . HYDROmorphone (DILAUDID) injection 0.5-1 mg  0.5-1 mg Intravenous Q2H PRN Pahwani, Rinka R, MD      . insulin aspart (novoLOG) injection 0-9 Units  0-9 Units Subcutaneous Q4H Allie Bossier, MD      . magnesium sulfate 3 g in dextrose 5 % 100 mL IVPB  3 g Intravenous Once Allie Bossier, MD 106 mL/hr at 12/17/19 1201 3 g at 12/17/19 1201  . megestrol (MEGACE) 400 MG/10ML suspension 800 mg  800 mg Oral Daily Pahwani, Rinka R, MD   800 mg at 12/17/19 1142  .  metoprolol succinate (TOPROL-XL) 24 hr tablet 25 mg  25 mg Oral QPM Pahwani, Rinka R, MD   25 mg at 12/03/2019 2052  . nystatin (MYCOSTATIN) 100000 UNIT/ML suspension 500,000 Units  5 mL Oral QID Pahwani, Rinka R, MD   500,000 Units at 12/17/19 1142  . ondansetron (ZOFRAN) tablet 4 mg  4 mg Oral Q6H PRN Pahwani, Rinka R, MD       Or  . ondansetron (ZOFRAN) injection 4 mg  4 mg Intravenous Q6H PRN Pahwani, Rinka R, MD      . potassium chloride SA (KLOR-CON) CR tablet 40 mEq  40 mEq Oral BID Allie Bossier, MD   40 mEq at 12/17/19 1143  . risperiDONE (RISPERDAL) tablet 2 mg  2 mg Oral QHS Pahwani, Rinka R, MD        REVIEW OF SYSTEMS:   Constitutional: ( - ) fevers, ( - )  chills , ( - ) night sweats Eyes: ( - ) blurriness of vision, ( - ) double vision, ( - ) watery eyes Ears, nose, mouth, throat, and face: ( - ) mucositis, ( - ) sore throat Respiratory: ( - ) cough, ( - ) dyspnea, ( - ) wheezes Cardiovascular: ( - ) palpitation, ( - ) chest discomfort, ( - ) lower extremity swelling Gastrointestinal:  ( - ) nausea, ( - ) heartburn, ( - ) change in bowel habits Skin: ( - ) abnormal skin rashes Lymphatics: ( - ) new lymphadenopathy, ( - ) easy bruising Neurological: ( - ) numbness, ( - ) tingling, ( - ) new weaknesses Behavioral/Psych: ( - ) mood change, ( - ) new changes  All other systems were reviewed with the patient and are negative.  PHYSICAL EXAMINATION: ECOG PERFORMANCE STATUS: 2 -  Symptomatic, <50% confined to bed  Vitals:   12/17/19 1134 12/17/19 1139  BP: 103/75 (!) 103/90  Pulse: 103 100  Resp:    Temp:  97.6 F (36.4 C)  SpO2: 99% 95%   Filed Weights   11/24/2019 1130 12/06/2019 2152  Weight: 122 lb (55.3 kg) 121 lb 0.5 oz (54.9 kg)    GENERAL: chronically ill appearing middle aged [place] American male in NAD  SKIN: skin color, texture, turgor are normal, no rashes or significant lesions EYES: conjunctiva are pink and non-injected, sclera clear LUNGS: clear to auscultation and percussion with normal breathing effort HEART: regular rate & rhythm and no murmurs and no lower extremity edema Musculoskeletal: no cyanosis of digits and no clubbing  PSYCH: alert & oriented x 3, fluent speech NEURO: no focal motor/sensory deficits  LABORATORY DATA:  I have reviewed the data as listed CBC Latest Ref Rng & Units 12/17/2019 12/03/2019 12/23/2019  WBC 4.0 - 10.5 K/uL 9.0 10.4 12.1(H)  Hemoglobin 13.0 - 17.0 g/dL 9.4(L) 9.2(L) 9.9(L)  Hematocrit 39 - 52 % 28.9(L) 28.9(L) 31.3(L)  Platelets 150 - 400 K/uL 136(L) 140(L) 156    CMP Latest Ref Rng & Units 12/17/2019 12/21/2019 12/14/2019  Glucose 70 - 99 mg/dL 68(L) - 113(H)  BUN 6 - 20 mg/dL 6 - 7  Creatinine 0.61 - 1.24 mg/dL 0.70 0.58(L) 0.71  Sodium 135 - 145 mmol/L 137 - 134(L)  Potassium 3.5 - 5.1 mmol/L 3.0(L) - 2.7(LL)  Chloride 98 - 111 mmol/L 95(L) - 93(L)  CO2 22 - 32 mmol/L 26 - 24  Calcium 8.9 - 10.3 mg/dL 10.0 - 10.6(H)  Total Protein 6.5 - 8.1 g/dL 6.2(L) - 6.8  Total Bilirubin 0.3 -  1.2 mg/dL 1.5(H) - 0.9  Alkaline Phos 38 - 126 U/L 159(H) - 183(H)  AST 15 - 41 U/L 26 - 29  ALT 0 - 44 U/L 12 - 13     PATHOLOGY: None to review yet.   RADIOGRAPHIC STUDIES: I have personally reviewed the radiological images as listed and agreed with the findings in the report: axillary, mediastinal, and RP lymphadenopathy on CT scan of the C/A/P   DG Lumbar Spine Complete  Result Date: 11/28/2019 CLINICAL DATA:   Fall 3 weeks ago with low back pain EXAM: LUMBAR SPINE - COMPLETE 4+ VIEW COMPARISON:  Chest examination from June of 2021 FINDINGS: No sign of fracture or malalignment in the lumbar spine. Vertebral body heights are maintained with mild disc space narrowing and anterior osteophyte formation greatest at L3-4 and L5-S1. Mild facet arthropathy is also demonstrated. IMPRESSION: 1. No signs of fracture or malalignment. 2. Mild degenerative disc disease in the lower lumbar spine. Electronically Signed   By: Zetta Bills M.D.   On: 11/28/2019 11:37   CT HEAD WO CONTRAST  Result Date: 12/10/2019 CLINICAL DATA:  Slurred speech for the past 3 days with gait disturbance since yesterday. History of 2 previous strokes. EXAM: CT HEAD WITHOUT CONTRAST TECHNIQUE: Contiguous axial images were obtained from the base of the skull through the vertex without intravenous contrast. COMPARISON:  None. FINDINGS: Brain: Mildly enlarged ventricles and cortical sulci. Mild patchy white matter low density in both cerebral hemispheres. Old right corona radiata lacunar infarct. No intracranial hemorrhage, mass lesion or CT evidence of acute infarction. Vascular: No hyperdense vessel or unexpected calcification. Skull: Normal. Negative for fracture or focal lesion. Sinuses/Orbits: Mild to moderate left maxillary sinus mucosal thickening and minimal right maxillary sinus mucosal thickening. Right ethmoid sinus rounded metallic foreign body compatible with a BB. Other: None. IMPRESSION: 1. No acute abnormality. 2. Mild diffuse cerebral and cerebellar atrophy. 3. Mild chronic small vessel white matter ischemic changes in both cerebral hemispheres. 4. Old right corona radiata lacunar infarct. 5. Mild chronic bilateral maxillary sinusitis. 6. Right ethmoid sinus BB. Electronically Signed   By: Claudie Revering M.D.   On: 12/08/2019 12:26   CT Chest W Contrast  Result Date: 12/01/2019 CLINICAL DATA:  Follow-up abnormal chest x-ray suggesting  chest wall mass EXAM: CT CHEST, ABDOMEN, AND PELVIS WITH CONTRAST TECHNIQUE: Multidetector CT imaging of the chest, abdomen and pelvis was performed following the standard protocol during bolus administration of intravenous contrast. CONTRAST:  167mL OMNIPAQUE IOHEXOL 300 MG/ML  SOLN COMPARISON:  Film from earlier in the same day. FINDINGS: CT CHEST FINDINGS Cardiovascular: Atherosclerotic calcifications of the thoracic aorta are noted. No aneurysmal dilatation or dissection is seen. No cardiac enlargement is noted. Pacing device is again seen. The pulmonary artery is enlarged without definitive central filling defect. Mediastinum/Nodes: Thoracic inlet is within normal limits. Scattered small hilar and mediastinal lymph nodes are noted. The esophagus appears within normal limits. Bilateral axillary adenopathy is identified the largest of these on the right measures 12 mm. The largest of these on the left measures 11 mm. Lungs/Pleura: Emphysematous changes are seen. The left lung is clear. The emphysematous changes are more marked on the right. This contributes to the somewhat lucent area seen on recent chest x-ray. No sizable effusion is seen. Enhancing pleural base lesions are seen throughout the right chest adjacent to the anterior aspect of the right fourth rib as well as the posterolateral aspect of the right seventh rib and posterior aspect of the  right ninth rib. Given the multiplicity of pleural based lesions, metastatic disease deserves consideration. Musculoskeletal: Pathologic fracture is noted involving the posterolateral aspect of the right seventh rib secondary to the pleural based mass. This corresponds with the finding seen on recent chest x-ray. No other definitive pathologic fracture is seen. The vertebral bodies show no definitive compression deformity. CT ABDOMEN PELVIS FINDINGS Hepatobiliary: No focal liver abnormality is seen. No gallstones, gallbladder wall thickening, or biliary dilatation.  Pancreas: Unremarkable. No pancreatic ductal dilatation or surrounding inflammatory changes. Spleen: Normal in size without focal abnormality. Adrenals/Urinary Tract: Adrenal glands are within normal limits. Kidneys demonstrate a normal enhancement pattern bilaterally. No renal calculi or obstructive changes are seen. Kidneys demonstrate a normal excretion of contrast bilaterally on delayed images. The bladder is partially decompressed. Stomach/Bowel: Colon shows no obstructive or inflammatory changes. The appendix is well visualized and within normal limits. No small bowel or gastric abnormality is noted. Vascular/Lymphatic: Atherosclerotic calcifications of the abdominal aorta are seen without definitive aneurysmal dilatation. Considerable retroperitoneal adenopathy is noted in the Peri aortic and intra-aortocaval region. The largest area is noted adjacent to aortic bifurcation measuring approximately 4.3 x 1.9 cm in greatest dimension. No significant inguinal adenopathy is noted. Bilateral common iliac adenopathy is seen as well. Reproductive: Prostate is unremarkable. Other: Minimal free fluid is noted which may be physiologic in nature. Musculoskeletal: Degenerative changes of lumbar spine are noted. There is a moth-eaten pattern identified within the right iliac bone adjacent to the sacroiliac joint as well as an area of bony destruction in the left half of the sacrum best seen on image number 105 of series 4. Soft tissue nodule is noted in the right buttock best seen on image number 85 of series 4 which measures 17 mm in greatest dimension. This may be related to the underlying diffuse process although may be incidental. IMPRESSION: Constellation of findings as described above consistent with widespread neoplastic involvement. Given the lymphadenopathy in the possibility of lymphoma is felt to be most likely. Node biopsy would be helpful. Additionally PET-CT imaging is recommended for further evaluation.  Electronically Signed   By: Inez Catalina M.D.   On: 12/01/2019 15:13   CT ABDOMEN PELVIS W CONTRAST  Result Date: 12/03/2019 CLINICAL DATA:  Follow-up abnormal chest x-ray suggesting chest wall mass EXAM: CT CHEST, ABDOMEN, AND PELVIS WITH CONTRAST TECHNIQUE: Multidetector CT imaging of the chest, abdomen and pelvis was performed following the standard protocol during bolus administration of intravenous contrast. CONTRAST:  117mL OMNIPAQUE IOHEXOL 300 MG/ML  SOLN COMPARISON:  Film from earlier in the same day. FINDINGS: CT CHEST FINDINGS Cardiovascular: Atherosclerotic calcifications of the thoracic aorta are noted. No aneurysmal dilatation or dissection is seen. No cardiac enlargement is noted. Pacing device is again seen. The pulmonary artery is enlarged without definitive central filling defect. Mediastinum/Nodes: Thoracic inlet is within normal limits. Scattered small hilar and mediastinal lymph nodes are noted. The esophagus appears within normal limits. Bilateral axillary adenopathy is identified the largest of these on the right measures 12 mm. The largest of these on the left measures 11 mm. Lungs/Pleura: Emphysematous changes are seen. The left lung is clear. The emphysematous changes are more marked on the right. This contributes to the somewhat lucent area seen on recent chest x-ray. No sizable effusion is seen. Enhancing pleural base lesions are seen throughout the right chest adjacent to the anterior aspect of the right fourth rib as well as the posterolateral aspect of the right seventh rib and posterior aspect  of the right ninth rib. Given the multiplicity of pleural based lesions, metastatic disease deserves consideration. Musculoskeletal: Pathologic fracture is noted involving the posterolateral aspect of the right seventh rib secondary to the pleural based mass. This corresponds with the finding seen on recent chest x-ray. No other definitive pathologic fracture is seen. The vertebral bodies  show no definitive compression deformity. CT ABDOMEN PELVIS FINDINGS Hepatobiliary: No focal liver abnormality is seen. No gallstones, gallbladder wall thickening, or biliary dilatation. Pancreas: Unremarkable. No pancreatic ductal dilatation or surrounding inflammatory changes. Spleen: Normal in size without focal abnormality. Adrenals/Urinary Tract: Adrenal glands are within normal limits. Kidneys demonstrate a normal enhancement pattern bilaterally. No renal calculi or obstructive changes are seen. Kidneys demonstrate a normal excretion of contrast bilaterally on delayed images. The bladder is partially decompressed. Stomach/Bowel: Colon shows no obstructive or inflammatory changes. The appendix is well visualized and within normal limits. No small bowel or gastric abnormality is noted. Vascular/Lymphatic: Atherosclerotic calcifications of the abdominal aorta are seen without definitive aneurysmal dilatation. Considerable retroperitoneal adenopathy is noted in the Peri aortic and intra-aortocaval region. The largest area is noted adjacent to aortic bifurcation measuring approximately 4.3 x 1.9 cm in greatest dimension. No significant inguinal adenopathy is noted. Bilateral common iliac adenopathy is seen as well. Reproductive: Prostate is unremarkable. Other: Minimal free fluid is noted which may be physiologic in nature. Musculoskeletal: Degenerative changes of lumbar spine are noted. There is a moth-eaten pattern identified within the right iliac bone adjacent to the sacroiliac joint as well as an area of bony destruction in the left half of the sacrum best seen on image number 105 of series 4. Soft tissue nodule is noted in the right buttock best seen on image number 85 of series 4 which measures 17 mm in greatest dimension. This may be related to the underlying diffuse process although may be incidental. IMPRESSION: Constellation of findings as described above consistent with widespread neoplastic  involvement. Given the lymphadenopathy in the possibility of lymphoma is felt to be most likely. Node biopsy would be helpful. Additionally PET-CT imaging is recommended for further evaluation. Electronically Signed   By: Inez Catalina M.D.   On: 12/20/2019 15:13   DG Chest Portable 1 View  Result Date: 12/15/2019 CLINICAL DATA:  54 year old male with history of shortness of breath. EXAM: PORTABLE CHEST 1 VIEW COMPARISON:  Chest x-ray 11/04/2019. FINDINGS: Previously noted right upper lobe nodule is less apparent on today's examination. There is an increasing vague masslike density projecting over the lateral aspect of the right mid hemithorax. Probable expansile lytic lesion in the posterolateral aspect of the right seventh rib where there is a nondisplaced fracture. Left lung is clear. No pleural effusions. No evidence of pulmonary edema. Heart size is normal. Upper mediastinal contours are within normal limits. 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 IMPRESSION: 1. Unusual findings in the right hemithorax concerning for potential malignancy. Specifically, there appears to be an expansile lytic lesion in the posterolateral right seventh rib with probable nondisplaced pathologic fracture and adjacent ill-defined opacity in the right mid hemithorax. These findings should be better evaluated with follow-up contrast enhanced chest CT in the near future to exclude underlying malignancy. Electronically Signed   By: Vinnie Langton M.D.   On: 12/04/2019 12:43    ASSESSMENT & PLAN Mike Holland 54 y.o. male with medical history significant for HIV, HTN, and depression who presents for a CVA  and was incidentally found to have diffuse lymphadenopathy concerning for lymphoma.  After review the labs, review the imaging, discussion with the patient the findings are most consistent with diffuse lymphadenopathy, concerning for a  lymphoproliferative disorder.  This patient would be considered high risk for these findings given his history of HIV and current low CD4 count.  In order to confirm the diagnosis the gold standard would be to perform an excisional lymph node biopsy.  At this time it appears that the axillary lymph nodes are most easily accessible and therefore would be the lymph nodes of choice.  I would strongly discourage performing an FNA or core biopsy as this would not definitively give [place] the diagnosis for the type of lymphoma that he has.  He has already had studies including hepatitis B and hepatitis C which show no evidence of active infection.  From our perspective once the axillary lymph node biopsy is performed the patient can be discharged and the rest of our work-up and follow-up can be conducted in the outpatient setting.  #Diffuse Lymphadenopathy, concerning for Lymphoma #HIV with Low CD4+ count --CT scan shows patient has mediastinal, axillary, and retroperitoneal lymphadenopathy, concerning for a lymphoproliferative disorder --will order LDH and flow cytometry --recommend consult to general surgery for consideration of an excisional lymph node biopsy of one of his axillary lymph nodes. We would prefer to avoid FNA or core biopsies in favor of obtaining an entire lymph node --please order a TTE to assess the patient's cardiac function --patient's HIV + status puts him at high risk for the development of lymphoma. CD4 count was ordered earlier this month. Patient has no evidence of active Hep B or Hep C on prior testing.  --Hematology will continue to follow while patient is in house. We will provide f/u at time of d/c   #CVA --defer management and d/c planning to the primary team   All questions were answered. The patient knows to call the clinic with any problems, questions or concerns.  A total of more than 55 minutes were spent on this encounter and over half of that time was spent on counseling  and coordination of care as outlined above.   Ledell Peoples, MD Department of Hematology/Oncology [place] [facility] Phone: [phone] Pager: [phone] Email: [email or web address]  12/17/2019 12:02 PM
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
## 2019-12-17 NOTE — Evaluation (Signed)
Clinical/Bedside Swallow Evaluation Patient Details  Name: Mike Holland MRN: [id number] Date of Birth: 1965/12/28  Today's Date: 12/17/2019 Time: SLP Start Time (ACUTE ONLY): 7619 SLP Stop Time (ACUTE ONLY): 0835 SLP Time Calculation (min) (ACUTE ONLY): 12 min  Past Medical History:  Past Medical History:  Diagnosis Date   Depression    HIV (human immunodeficiency virus infection) ([place])    Hypertension    Pacemaker    Past Surgical History:  Past Surgical History:  Procedure Laterality Date   PACEMAKER PLACEMENT     HPI:  Mike Holland is a 54 y.o. male with medical history significant of HIV-diagnosed in 1996, R CVA, hypertension, depression, hyperlipidemia, ? tachybradycardia syndrome.  Patient presented to the ED with slurred speech and shuffling gait for 3 days.  Head CT was negative for acute abnormality, MRI pending.  CXR reported: "Unusual findings in the right hemithorax concerning for potential malignancy."    Assessment / Plan / Recommendation Clinical Impression  Patient was seen for a bedside swallow evaluation.  He was encountered awake/alert and he was agreeable to this evaluation.  Oral mechanism examination was unremarkable except for the fact that the pt has partial dentures on the bottom.  He reported that he has never had dysphagia or a need for diet changes following his previous CVAs.  Pt consumed trials of thin liquid (cup/straw), puree, and regular solids.  He fed himself independently with appropriate labial seal and suspected timely AP transport/swallow initiation with all trials.  Mastication of regular solids was noted to be mildly prolonged and pt required a liquid wash to clear oral residue.  Eructation was observed intermittently with liquid trials and pt reported that he had a hx of reflux.  No overt s/sx of aspiration were observed with any trials on this date.  Recommend diet initiation of Dysphagia 3 (soft) solids and thin liquids with medication  administered whole with liquid (one at a time).  SLP will briefly f/u to monitor diet tolerance and to determine readiness for clinical diet upgrade.    Of note, pt reported that his speech appeared to be slurred first thing in the morning and at night.  He stated that he has not noticed any additional speech/language/cognitive changes during this admission.  Pt's speech was 100% intelligible to this SLP, but slight slurring was observed intermittently.  Pt may benefit from a full cognitive-linguistic evaluation to further evaluate, particularly pending his MRI results.  MD please order if this aligns with your POC.    SLP Visit Diagnosis: Dysphagia, oral phase (R13.11)    Aspiration Risk  Mild aspiration risk    Diet Recommendation Dysphagia 3 (Mech soft);Thin liquid   Liquid Administration via: Cup;Straw Medication Administration: Whole meds with liquid Supervision: Patient able to self feed;Intermittent supervision to cue for compensatory strategies Compensations: Slow rate;Small sips/bites Postural Changes: Seated upright at 90 degrees;Remain upright for at least 30 minutes after po intake    Other  Recommendations Oral Care Recommendations: Oral care BID   Follow up Recommendations None      Frequency and Duration min 1 x/week  2 weeks       Prognosis Prognosis for Safe Diet Advancement: Good      Swallow Study   General HPI: Mike Holland is a 54 y.o. male with medical history significant of HIV-diagnosed in 1996, R CVA, hypertension, depression, hyperlipidemia, ? tachybradycardia syndrome.  Patient presented to the ED with slurred speech and shuffling gait for 3 days.  Head CT  was negative for acute abnomality, MRI pending.  CXR reported: "Unusual findings in the right hemithorax concerning for potential malignancy."  Type of Study: Bedside Swallow Evaluation Previous Swallow Assessment: None  Diet Prior to this Study: NPO Temperature Spikes Noted: No Respiratory  Status: Room air History of Recent Intubation: No Behavior/Cognition: Alert;Cooperative;Pleasant mood Oral Cavity Assessment: Within Functional Limits Oral Care Completed by SLP: No Oral Cavity - Dentition:  (Partial dentures on bottom ) Vision: Functional for self-feeding Self-Feeding Abilities: Able to feed self Patient Positioning: Upright in bed Baseline Vocal Quality: Normal Volitional Swallow: Able to elicit    Oral/Motor/Sensory Function Overall Oral Motor/Sensory Function: Within functional limits   Ice Chips Ice chips: Not tested   Thin Liquid Thin Liquid: Within functional limits    Nectar Thick Nectar Thick Liquid: Not tested   Honey Thick Honey Thick Liquid: Not tested   Puree Puree: Within functional limits   Solid     Solid: Impaired Presentation: Self Fed Oral Phase Functional Implications: Impaired mastication;Prolonged oral transit     Mike Holland M.S., CCC-SLP Acute Rehabilitation Services Office: [phone]  [place] 12/17/2019,8:50 AM

## 2019-12-17 NOTE — Progress Notes (Addendum)
Initial Nutrition Assessment  DOCUMENTATION CODES:   Not applicable  INTERVENTION:  Ensure Enlive po TID, each supplement provides 350 kcal and 20 grams of protein  Magic cup TID with meals, each supplement provides 290 kcal and 9 grams of protein  79ml Prosource Plus po BID, each supplement provides 100 kcal and 15 grams protein  NUTRITION DIAGNOSIS:   Inadequate oral intake related to poor appetite as evidenced by per patient/family report.    GOAL:   Patient will meet greater than or equal to 90% of their needs    MONITOR:   PO intake, Supplement acceptance, Weight trends, Labs, I & O's  REASON FOR ASSESSMENT:   Consult Assessment of nutrition requirement/status  ASSESSMENT:   Pt with widespread metastatic malignant neoplastic disease presented with slurred speech and shuffling gait x3 days and generalized weakness, poor appetite, and wt loss x1 month. 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  Pt with possible new stroke per MD, Neurology workup in progress.   Per Surgery, pt with widespread lymphadenopathy, degenerative changes in lumbar spine and right iliac, Right seventh rib pathologic fracture, Pathologic changes right 4th,7th and 9th ribs, Bilateral axillary lymphadenopathy.  Oncology and Palliative Care consults pending.   RD unable to reach pt via phone.   Per H&P, pt reports unintentional weight loss of 20 pounds in 1 month. Also reports generalized weakness, lethargy, decreased appetite with poor po intake over the last few weeks. Pt uses marijuana 3 times daily to help with appetite.   Per wt readings, pt weighed 64.1 kg on 06/22/19. Pt now weighs 54.9 kg. This indicates a 14.18% wt loss x6 months, which is significant for time frame.   Suspect pt is malnourished given disease states and significant wt loss; however, unable to diagnose at this time without detailed  diet history and/or nutrition-focused physical exam.   No PO intake documented.   Labs: K+ 3.0 (L), Mg 1.6 (L), Corrected Ca 11.6 (H) CBGs 69-131-88 Medications: Folvite, Novolog, Megace, Klor-con, 3g IV Magnesium Sulfate  NUTRITION - FOCUSED PHYSICAL EXAM:  Working remotely. Unable to perform at this time. Will attempt at follow-up.   Diet Order:   Diet Order            DIET DYS 3 Room service appropriate? Yes with Assist; Fluid consistency: Thin  Diet effective now                 EDUCATION NEEDS:   No education needs have been identified at this time  Skin:  Skin Assessment: Reviewed RN Assessment  Last BM:  7/19  Height:   Ht Readings from Last 1 Encounters:  11/15/19 5\' 7"  (1.702 m)    Weight:   Wt Readings from Last 10 Encounters:  12/21/2019 54.9 kg  12/13/19 55.3 kg  11/15/19 65.8 kg  09/20/19 67.1 kg  08/19/19 68 kg  06/24/19 64 kg  06/22/19 64.1 kg    BMI:  Body mass index is 18.96 kg/m.  Estimated Nutritional Needs:   Kcal:  2000-2200  Protein:  100-120 grams  Fluid:  >/= 2L/d    Larkin Ina, MS, RD, LDN RD pager number and weekend/on-call pager number located in [place].

## 2019-12-18 ENCOUNTER — Inpatient Hospital Stay (HOSPITAL_COMMUNITY): Payer: Medicaid Other

## 2019-12-18 DIAGNOSIS — M899 Disorder of bone, unspecified: Secondary | ICD-10-CM

## 2019-12-18 DIAGNOSIS — M8448XA Pathological fracture, other site, initial encounter for fracture: Secondary | ICD-10-CM

## 2019-12-18 DIAGNOSIS — R000 Tachycardia, unspecified: Secondary | ICD-10-CM

## 2019-12-18 DIAGNOSIS — D649 Anemia, unspecified: Secondary | ICD-10-CM

## 2019-12-18 DIAGNOSIS — R634 Abnormal weight loss: Secondary | ICD-10-CM

## 2019-12-18 LAB — COMPREHENSIVE METABOLIC PANEL
ALT: 13 U/L (ref 0–44)
AST: 34 U/L (ref 15–41)
Albumin: 2.5 g/dL — ABNORMAL LOW (ref 3.5–5.0)
Alkaline Phosphatase: 156 U/L — ABNORMAL HIGH (ref 38–126)
Anion gap: 13 (ref 5–15)
BUN: 7 mg/dL (ref 6–20)
CO2: 25 mmol/L (ref 22–32)
Calcium: 10.2 mg/dL (ref 8.9–10.3)
Chloride: 98 mmol/L (ref 98–111)
Creatinine, Ser: 0.61 mg/dL (ref 0.61–1.24)
GFR calc Af Amer: >60 mL/min (ref >60)
GFR calc non Af Amer: >60 mL/min (ref >60)
Glucose, Bld: 128 mg/dL — ABNORMAL HIGH (ref 70–99)
Potassium: 4.3 mmol/L (ref 3.5–5.1)
Sodium: 136 mmol/L (ref 135–145)
Total Bilirubin: 0.8 mg/dL (ref 0.3–1.2)
Total Protein: 6.1 g/dL — ABNORMAL LOW (ref 6.5–8.1)

## 2019-12-18 LAB — LIPID PANEL
Cholesterol: 99 mg/dL (ref 0–200)
HDL: 17 mg/dL — ABNORMAL LOW (ref >40)
LDL Cholesterol: 52 mg/dL (ref 0–99)
Total CHOL/HDL Ratio: 5.8 RATIO
Triglycerides: 151 mg/dL — ABNORMAL HIGH (ref <150)
VLDL: 30 mg/dL (ref 0–40)

## 2019-12-18 LAB — MRSA PCR SCREENING: MRSA by PCR: NEGATIVE

## 2019-12-18 LAB — GLUCOSE, CAPILLARY
Glucose-Capillary: 120 mg/dL — ABNORMAL HIGH (ref 70–99)
Glucose-Capillary: 210 mg/dL — ABNORMAL HIGH (ref 70–99)
Glucose-Capillary: 218 mg/dL — ABNORMAL HIGH (ref 70–99)
Glucose-Capillary: 207 mg/dL — ABNORMAL HIGH (ref 70–99)
Glucose-Capillary: 123 mg/dL — ABNORMAL HIGH (ref 70–99)

## 2019-12-18 LAB — CBC WITH DIFFERENTIAL/PLATELET
Abs Immature Granulocytes: 0.97 10*3/uL — ABNORMAL HIGH (ref 0.00–0.07)
Basophils Absolute: 0.1 10*3/uL (ref 0.0–0.1)
Basophils Relative: 1 %
Eosinophils Absolute: 0.1 10*3/uL (ref 0.0–0.5)
Eosinophils Relative: 2 %
HCT: 27.2 % — ABNORMAL LOW (ref 39.0–52.0)
Hemoglobin: 8.8 g/dL — ABNORMAL LOW (ref 13.0–17.0)
Immature Granulocytes: 14 %
Lymphocytes Relative: 18 %
Lymphs Abs: 1.3 10*3/uL (ref 0.7–4.0)
MCH: 29.8 pg (ref 26.0–34.0)
MCHC: 32.4 g/dL (ref 30.0–36.0)
MCV: 92.2 fL (ref 80.0–100.0)
Monocytes Absolute: 0.2 10*3/uL (ref 0.1–1.0)
Monocytes Relative: 3 %
Neutro Abs: 4.3 10*3/uL (ref 1.7–7.7)
Neutrophils Relative %: 62 %
Platelets: 101 10*3/uL — ABNORMAL LOW (ref 150–400)
RBC: 2.95 MIL/uL — ABNORMAL LOW (ref 4.22–5.81)
RDW: 14 % (ref 11.5–15.5)
WBC: 6.9 10*3/uL (ref 4.0–10.5)
nRBC: 0.7 % — ABNORMAL HIGH (ref 0.0–0.2)

## 2019-12-18 LAB — MAGNESIUM: Magnesium: 1.7 mg/dL (ref 1.7–2.4)

## 2019-12-18 LAB — RETICULOCYTES
Immature Retic Fract: 12.7 % (ref 2.3–15.9)
RBC.: 2.86 MIL/uL — ABNORMAL LOW (ref 4.22–5.81)
Retic Count, Absolute: 19.2 10*3/uL (ref 19.0–186.0)
Retic Ct Pct: 0.7 % (ref 0.4–3.1)

## 2019-12-18 LAB — FOLATE: Folate: 10.2 ng/mL (ref >5.9)

## 2019-12-18 LAB — PHOSPHORUS: Phosphorus: 2.2 mg/dL — ABNORMAL LOW (ref 2.5–4.6)

## 2019-12-18 LAB — D-DIMER, QUANTITATIVE: D-Dimer, Quant: 9.71 ug/mL-FEU — ABNORMAL HIGH (ref 0.00–0.50)

## 2019-12-18 LAB — IRON AND TIBC
Iron: 97 ug/dL (ref 45–182)
Saturation Ratios: 69 % — ABNORMAL HIGH (ref 17.9–39.5)
TIBC: 141 ug/dL — ABNORMAL LOW (ref 250–450)
UIBC: 44 ug/dL

## 2019-12-18 LAB — VITAMIN B12: Vitamin B-12: 449 pg/mL (ref 180–914)

## 2019-12-18 LAB — FERRITIN: Ferritin: >7500 ng/mL — ABNORMAL HIGH (ref 24–336)

## 2019-12-18 MED ORDER — MAGNESIUM SULFATE 2 GM/50ML IV SOLN
2.0000 g | Freq: Once | INTRAVENOUS | Status: AC
  Administered 2019-12-18: 2 g via INTRAVENOUS
  Filled 2019-12-18: qty 50

## 2019-12-18 MED ORDER — AMIODARONE HCL IN DEXTROSE 360-4.14 MG/200ML-% IV SOLN
60.0000 mg/h | INTRAVENOUS | Status: AC
  Administered 2019-12-18: 60 mg/h via INTRAVENOUS
  Filled 2019-12-18: qty 200

## 2019-12-18 MED ORDER — AMIODARONE HCL IN DEXTROSE 360-4.14 MG/200ML-% IV SOLN
30.0000 mg/h | INTRAVENOUS | Status: DC
  Administered 2019-12-18 – 2019-12-21 (×4): 30 mg/h via INTRAVENOUS
  Filled 2019-12-18 (×9): qty 200

## 2019-12-18 MED ORDER — AMIODARONE LOAD VIA INFUSION
150.0000 mg | Freq: Once | INTRAVENOUS | Status: AC
  Administered 2019-12-18: 150 mg via INTRAVENOUS
  Filled 2019-12-18: qty 83.34

## 2019-12-18 MED ORDER — METOPROLOL TARTRATE 5 MG/5ML IV SOLN
5.0000 mg | Freq: Once | INTRAVENOUS | Status: AC
  Administered 2019-12-18: 5 mg via INTRAVENOUS
  Filled 2019-12-18: qty 5

## 2019-12-18 NOTE — Progress Notes (Signed)
Brief Nutrition Note  RD received consult on 12/18/19 to assess pt. Please see full RD assessment on 12/17/19.  Larkin Ina, MS, RD, LDN RD pager number and weekend/on-call pager number located in [place].

## 2019-12-18 NOTE — Evaluation (Signed)
Occupational Therapy Evaluation Patient Details Name: Mike Holland MRN: [id number] DOB: 01-26-1966 Today's Date: 12/18/2019    History of Present Illness Pt is a 54yo male with PMH: HIV, GYN, stroke, pacemaker and depression who was admitted on 12/07/2019 with slurred speech and shuffling gaitx3days. Pt with report of a 20 pound weight loss in the last 2 weeks. Imaging showed pleural-based lesions t/o R chest and various lesions around ribs including pathologic fractures a R 7th rib indicitive of a malignant neoplastic event work up underway to for lymphoma   Clinical Impression   Pt admitted with above. He demonstrates the below listed deficits and will benefit from continued OT to maximize safety and independence with BADLs.  Pt presents to OT with decreased activity tolerance, and generalized weakness as well as impaired cognition.  HR increased to 139 when donning Rt sock and HR to 151 with RR 44 when transferring to recliner - pt on 2L supplemental 02.  Per RN, pt had ambulated to BR and had cleaned up at sink ~30 mins prior to OT session.  Overall, he requires set up to max A for ADLs and up to mod A for functional transfers.    He lives alone with family assisting intermittently.  Based on today's level of functioning, do not feel he will be able to return to previous living situation which is him living alone in 2nd floor apartment.   He will likely need 24 hour assist, at discharge.  Recommend SNF, unless family able to provide care.  Will follow acutely.       Follow Up Recommendations  SNF;Supervision/Assistance - 24 hour    Equipment Recommendations  3 in 1 bedside commode;Tub/shower bench;Hospital bed    Recommendations for Other Services       Precautions / Restrictions Precautions Precautions: Fall Restrictions Weight Bearing Restrictions: No      Mobility Bed Mobility Overal bed mobility: Needs Assistance Bed Mobility: Sit to Supine;Supine to Sit     Supine to sit:  Supervision     General bed mobility comments: HOB elevated.  Pt requires incrased time an effort   Transfers Overall transfer level: Needs assistance Equipment used: Rolling walker (2 wheeled) Transfers: Sit to/from [facility] Sit to Stand: Mod assist Stand pivot transfers: Min guard       General transfer comment: mod A to power up into standing and min A to pivot to chair with HR to 151, RR 44 on 2L 02     Balance Overall balance assessment: Needs assistance Sitting-balance support: No upper extremity supported;Feet supported Sitting balance-Leahy Scale: Fair     Standing balance support: Bilateral upper extremity supported Standing balance-Leahy Scale: Poor Standing balance comment: requires UE support                            ADL either performed or assessed with clinical judgement   ADL Overall ADL's : Needs assistance/impaired Eating/Feeding: Independent;Sitting;Bed level   Grooming: Wash/dry hands;Wash/dry face;Oral care;Set up;Sitting   Upper Body Bathing: Moderate assistance;Sitting   Lower Body Bathing: Moderate assistance;Sit to/from stand   Upper Body Dressing : Moderate assistance;Sitting   Lower Body Dressing: Maximal assistance;Sit to/from stand   Toilet Transfer: Moderate assistance;Stand-pivot;BSC;RW   Toileting- Clothing Manipulation and Hygiene: Moderate assistance;Sit to/from stand       Functional mobility during ADLs: Moderate assistance;Rolling walker General ADL Comments: Pt fatigued quickly with very minimal activity - HR increased to 139 and  RR 44 on 2L 02 with pt donning sock, and HR to 151 with stand pivot to chair.  RN did report pt ambulated to BR, and performed bath with assist ~30 mins Prior to OT eval      Vision Baseline Vision/History: No visual deficits Patient Visual Report: No change from baseline Vision Assessment?: No apparent visual deficits     Perception Perception Perception Tested?:  Yes   Praxis Praxis Praxis tested?: Within functional limits    Pertinent Vitals/Pain Pain Assessment: No/denies pain     Hand Dominance Right   Extremity/Trunk Assessment Upper Extremity Assessment Upper Extremity Assessment: Generalized weakness   Lower Extremity Assessment Lower Extremity Assessment: Generalized weakness   Cervical / Trunk Assessment Cervical / Trunk Assessment: Normal   Communication Communication Communication: Expressive difficulties   Cognition Arousal/Alertness: Awake/alert Behavior During Therapy: WFL for tasks assessed/performed Overall Cognitive Status: No family/caregiver present to determine baseline cognitive functioning                                 General Comments: Pt slow to respond and demonstrates a delay in processing    General Comments  MD present and updated on pt performance     Exercises     Shoulder Instructions      Home Living Family/patient expects to be discharged to:: Private residence Living Arrangements: Alone Available Help at Discharge: Friend(s);Available PRN/intermittently Type of Home: Apartment Home Access: Stairs to enter Entrance Stairs-Number of Steps: 12 Entrance Stairs-Rails: Right;Left Home Layout: One level     Bathroom Shower/Tub: Teacher, early years/pre: Standard     Home Equipment: Environmental consultant - 2 wheels          Prior Functioning/Environment Level of Independence: Needs assistance  Gait / Transfers Assistance Needed: pt has been recently using RW for amb ADL's / Homemaking Assistance Needed: niece takes pt to grocery store and he uses electronic buggy. Niece supervises dressing/bathing Communication / Swallowing Assistance Needed: slurred speech          OT Problem List: Decreased strength;Decreased activity tolerance;Impaired balance (sitting and/or standing);Decreased safety awareness;Decreased knowledge of use of DME or AE;Cardiopulmonary status limiting  activity      OT Treatment/Interventions: Self-care/ADL training;Therapeutic exercise;DME and/or AE instruction;Energy conservation;Therapeutic activities;Patient/family education;Cognitive remediation/compensation;Balance training    OT Goals(Current goals can be found in the care plan section) Acute Rehab OT Goals Patient Stated Goal: to get stronger  OT Goal Formulation: With patient Time For Goal Achievement: 01/01/20 Potential to Achieve Goals: Fair ADL Goals Pt Will Perform Grooming: with min assist;standing Pt Will Perform Upper Body Bathing: with set-up;with supervision;sitting Pt Will Perform Lower Body Bathing: with min assist;with adaptive equipment;sit to/from stand Pt Will Perform Upper Body Dressing: with set-up;sitting Pt Will Perform Lower Body Dressing: with min assist;with adaptive equipment;sit to/from stand Pt Will Transfer to Toilet: with min guard assist;ambulating;regular height toilet;bedside commode;grab bars Pt Will Perform Toileting - Clothing Manipulation and hygiene: with min guard assist;sit to/from stand Additional ADL Goal #1: Pt will independently incorporate energy conservation techniques during ADLs  OT Frequency: Min 2X/week   Barriers to D/C: Decreased caregiver support          Co-evaluation              AM-PAC OT "6 Clicks" Daily Activity     Outcome Measure Help from another person eating meals?: None Help from another person taking care of personal grooming?: A  Little Help from another person toileting, which includes using toliet, bedpan, or urinal?: A Lot Help from another person bathing (including washing, rinsing, drying)?: A Lot Help from another person to put on and taking off regular upper body clothing?: A Lot Help from another person to put on and taking off regular lower body clothing?: A Lot 6 Click Score: 15   End of Session Equipment Utilized During Treatment: Oxygen;Rolling walker Nurse Communication: Mobility status  (HR and RR )  Activity Tolerance: Patient limited by lethargy;Patient limited by fatigue Patient left: in chair;with call bell/phone within reach  OT Visit Diagnosis: Muscle weakness (generalized) (M62.81);Unsteadiness on feet (R26.81)                Time: 3664-4034 OT Time Calculation (min): 26 min Charges:  OT General Charges $OT Visit: 1 Visit OT Evaluation $OT Eval Moderate Complexity: 1 Mod OT Treatments $Self Care/Home Management : 8-22 mins  Nilsa Nutting., OTR/L Acute Rehabilitation Services Pager [phone] Office [place], [place] 12/18/2019, 11:11 AM

## 2019-12-18 NOTE — Progress Notes (Signed)
PROGRESS NOTE    Mike Holland  QQP:[id number] DOB: 1966/03/18 DOA: 12/18/2019 PCP: Gildardo Pounds, NP     Brief Narrative:  Mike Holland is a 54 y.o. BM PMHx HIV-diagnosed in 1996, CVA, HTN, HLD, Depression, Tachybradycardia syndrome?    S/p Medtronic Pacemaker placement in July 2018   Presents to emergency department with slurred speech and shuffling gait since 3 days.   Patient tells me that he has history of CVA.  He started having slurred speech and shuffling gait/difficulty in walking since 3 days.  He tells me that today his symptoms are getting better.  He denies headache, blurry vision, facial drop, seizures, loss of consciousness, lightheadedness, dizziness, numbness weakness tingling sensation in hands or feet, fall or head trauma.  Reports unintentional weight loss of 20 pounds in 1 month, generalized weakness, lethargic, decreased appetite.  He is followed by infectious disease outpatient for history of HIV.  He was seen by Dr. Belenda Cruise on 7/20-Genvoya was discontinued due to weight loss and was started on Biktarvy and megestrol.  He takes Valtrex for prophylaxis.  He denies fever, chills, nausea, vomiting, diarrhea, abdominal pain, chest pain, shortness of breath, palpitation, leg swelling, urinary or sleep changes.  He tells me that he has chronic constipation and has bowel movement every 4 to 5 days.  He lives alone at home.  Moved from [place] on November 2020.  Denies cigarette smoking, alcohol however uses marijuana 3 times daily to help with appetite.  He tells me pacemaker was placed in July 2018 due to irregular heart rate.  ED Course: Upon arrival to ED: Patient slightly tachycardic with heart rate in 105, afebrile, maintaining oxygen saturation on room air, WBC: 12.1, H&H: 9.9/31.1, ethanol: WNL, CMP shows potassium of 2.7, calcium: 10.6, UDS positive for marijuana, UA negative for infection.  Magnesium: 1.3.  CT head came back negative for acute findings.   MRI was not obtained due to pacemaker.  Neurology consulted.  Chest x-ray, CT abdomen/CT chest was obtained which shows findings consistent with widespread neoplastic involvement-concerning for lymphoma.  EDP consulted oncology.  [facility] hospitalist consulted for admission.  Apparently: Patient's pacemaker is conditional for MRI and will need one of the trained MRI technician to change pacemaker settings into MRI safe mode however there is no staff available today that are trained to do this.    Subjective: 7/20 5A/O x4, negative CP, negative S OB, negative abdominal pain.  Positive DOE, extremely weak  Assessment & Plan: Covid vaccination; no vaccination   Principal Problem:   Widespread metastatic malignant neoplastic disease ([place]) Active Problems:   HIV disease ([place])   Essential hypertension   Depression   Unintentional weight loss   HLD (hyperlipidemia)   Pacemaker   Hypokalemia   Hypomagnesemia   Normocytic anemia   Marijuana abuse   Slurred speech   Palliative care by specialist   Goals of care, counseling/discussion   DNR (do not resuscitate)  Slurred speech/shuffling gait -Negative slurred speech -Did not attempt to ambulate patient -Hx CVA in the past, CT head negative for acute findings.  Unable to obtain MRI due to pacer.  -Per EMR neurology consulted by EDP.  No further stroke work-up warranted at this time given patient's findings on abdominal/chest CT for metastatic neoplasm. -After discussing case with cardiology hold off on MRI until Dr. Caryl Comes EP has a chance to evaluate patient.  We will then reorder MRI if he deems safe. -7/25 discussed case with Dr. Virl Axe cardiology  EP and he states that patient is safe to go ahead with his head MRI, radiology has a whole protocol already established for people with Medtronic pacers who require MRIs.  Prediabetes -7/23 hemoglobin A1c= 6.1, patient meets guidelines for prediabetes -Sensitive SSI for now -Prior to  discharge will start him on Metformin -7/25 consult to diabetic coordinator new diagnosis of prediabetes -7/25 consult to nutrition diagnosis of prediabetes  HLD -Atorvastatin 20 mg daily -Lipid panel pending   Widespread neoplasm -CT abdomen/pelvis concerning for lymphoma.  Discussed case with Dr. Narda Rutherford [facility] who is agreed to evaluate patient today. -Per oncology recommendations have ordered LDH, Fflow Cytometry -Have requested [facility] perform excisional biopsy of RIGHT axillary lymph node per oncology request.  PA Will from [facility] has agreed to see patient today  RIGHT seventh rib pathologic fracture -Likely secondary to malignancy/lymphoma -Currently negative pain  HIV -Dr. Belenda Cruise from ID follows patient.  Last seen 7/20  - Biktarvy 50-2 100-25 mg 1 tablet daily -Valtrex for prophylaxis (not taking per patient)  Tachybradycardia syndrome -S/p Medtronic Pacemaker placement  2018 July  -7/25 patient sinus tachycardia already received medication see HTN. -7/25 Amiodarone drip  Essential HTN/Hypotensive -Patient's BP on the low side, appears secondary to dehydration and malnutrition -Norvasc 10 mg daily -Toprol 25 mg daily -Albumin 50 g -D5-0.9% saline 31ml/hr  Normocytic anemia: -H&H dropped from 11.2/33.3-9.9/31.3 in 1 month. -Occult blood pending -Anemia panel consistent with normocytic anemia.  Patient has extremely high ferritin since ferritin is a inflammatory marker would be consistent with malignancy  Hypokalemia -Potassium goal > 4 -Stable   Hypomagnesmia -Magnesium goal > 2 -Magnesium IV 2 g  Marijuana abuse -Patient tells me that he uses marijuana 3 times daily to help with his appetite.  Oral candidiasis -Likely secondary to HIV -Nystatin QID  Moderate to severe protein calorie malnutrition -: BMI of 19 -History of unintentional weight loss.   -Consult nutritionist -Megace 800 mg daily  Depression/Bipolar:  -Patient not on  medication for depression or bipolar. -Continue patient's antipsychotic medication risperidone 2 mg daily   Goals of care -7/24 palliative care consult; patient with HIV, metastatic neoplasm change of CODE STATUS, short and long-term goals of care   DVT prophylaxis: Lovenox Code Status: DNR Family Communication:  Status is: Inpatient    Dispo: The patient is from: Home              Anticipated d/c is to: Home              Anticipated d/c date is: 7/30              Patient currently unstable      Consultants:  Medical oncology Dr. Narda Rutherford [facility] PA Will Palliative care Cardiology EP Dr. Virl Axe     Procedures/Significant Events:  CT chest W contrast; Cardiovascular:-pulmonary artery is enlarged without definitive central filling defect. Mediastinum/Nodes:-Scattered small hilar and mediastinal lymph nodes are noted.  -Bilateral axillary adenopathy is identified the largest of these on the right measures 12 mm. The largest of these on the left measures 11 mm. Lungs/Pleura: Emphysematous changes are seen. more marked on the right.  -Enhancing pleural base lesions are seen throughout the right chest adjacent to the anterior aspect of the right fourth rib as well as the posterolateral aspect of the right seventh rib and posterior aspect of the right ninth rib. Given the multiplicity of pleural based lesions, metastatic disease deserves consideration.  Musculoskeletal: Pathologic fracture is noted involving the  posterolateral aspect of the right seventh rib secondary to the pleural based mass. This corresponds with the finding seen on recent chest x-ray. No other definitive pathologic fracture is seen. The vertebral bodies show no definitive compression deformity.  CT ABDOMEN PELVIS FINDINGS The bladder is partially decompressed.   Vascular/Lymphatic: Atherosclerotic calcifications of the abdominal aorta are seen without definitive aneurysmal  dilatation. Considerable retroperitoneal adenopathy is noted in the Peri aortic and intra-aortocaval region. The largest area is noted adjacent to aortic bifurcation measuring approximately 4.3 x 1.9 cm in greatest dimension. No significant inguinal adenopathy is noted. Bilateral common iliac adenopathy is seen as well.  Reproductive: Prostate is unremarkable.  Other: Minimal free fluid is noted which may be physiologic in nature.  Musculoskeletal: Degenerative changes of lumbar spine are noted. There is a moth-eaten pattern identified within the right iliac bone adjacent to the sacroiliac joint as well as an area of bony destruction in the left half of the sacrum best seen on image number 105 of series 4. Soft tissue nodule is noted in the right buttock best seen on image number 85 of series 4 which measures 17 mm in greatest dimension. This may be related to the underlying diffuse process although may be incidental.  IMPRESSION: Constellation of findings as described above consistent with widespread neoplastic involvement. Given the lymphadenopathy in the possibility of lymphoma is felt to be most likely. Node biopsy would be helpful. Additionally PET-CT imaging is recommended for further evaluation.  I have personally reviewed and interpreted all radiology studies and my findings are as above.  VENTILATOR SETTINGS:    Cultures   Antimicrobials:    Devices Permanent pacemaker 2018>>>>   LINES / TUBES:      Continuous Infusions:  dextrose 5 % and 0.9% NaCl 50 mL/hr at 12/18/19 0600     Objective: Vitals:   12/17/19 2332 12/18/19 0347 12/18/19 0500 12/18/19 0728  BP: 115/79 113/79  (!) 129/81  Pulse: 92 100  (!) 109  Resp: (!) 34 (!) 40  20  Temp: 98.5 F (36.9 C) 97.9 F (36.6 C)  98.4 F (36.9 C)  TempSrc: Oral Oral  Oral  SpO2: 94% 94%  95%  Weight:   54.9 kg     Intake/Output Summary (Last 24 hours) at 12/18/2019 9528 Last data filed at  12/18/2019 0600 Gross per 24 hour  Intake 1372.76 ml  Output 625 ml  Net 747.76 ml   Filed Weights   12/09/2019 1130 12/21/2019 2152 12/18/19 0500  Weight: 55.3 kg 54.9 kg 54.9 kg   Physical Exam:  General: A/O x4, positive acute respiratory distress, cachectic Eyes: negative scleral hemorrhage, negative anisocoria, negative icterus ENT: Negative Runny nose, negative gingival bleeding, Neck:  Negative scars, masses, torticollis, lymphadenopathy, JVD Lungs: tachypneic, clear to auscultation lung fields with right basilar crackles, negative wheezing Cardiovascular: Tachycardic, without murmur gallop or rub normal S1 and S2, left chest wall pacer in place Abdomen: negative abdominal pain, nondistended, positive soft, bowel sounds, no rebound, no ascites, no appreciable mass Extremities: No significant cyanosis, clubbing, or edema bilateral lower extremities Skin: Negative rashes, lesions, ulcers Psychiatric:  Negative depression, negative anxiety, negative fatigue, negative mania  Central nervous system:  Cranial nerves II through XII intact, tongue/uvula midline, all extremities muscle strength 5/5, sensation intact throughout, negative dysarthria, negative expressive aphasia, negative receptive aphasia. .     Data Reviewed: Care during the described time interval was provided by me .  I have reviewed this patient's available data, including medical  history, events of note, physical examination, and all test results as part of my evaluation.  CBC: Recent Labs  Lab 12/13/19 1052 12/21/2019 1135 11/30/2019 1637 12/17/19 0536 12/18/19 0349  WBC 12.1* 12.1* 10.4 9.0 6.9  NEUTROABS  --  7.4  --   --  4.3  HGB 11.0* 9.9* 9.2* 9.4* 8.8*  HCT 32.9* 31.3* 28.9* 28.9* 27.2*  MCV 89.9 92.3 92.0 92.0 92.2  PLT 196 156 140* 136* 169*   Basic Metabolic Panel: Recent Labs  Lab 12/13/19 1053 12/06/2019 1135 12/08/2019 1300 12/18/2019 1637 12/17/19 0536 12/18/19 0349  NA 132* 134*  --   --  137  136  K 3.1* 2.7*  --   --  3.0* 4.3  CL 88* 93*  --   --  95* 98  CO2 26 24  --   --  26 25  GLUCOSE 116* 113*  --   --  68* 128*  BUN 17 7  --   --  6 7  CREATININE 0.76 0.71  --  0.58* 0.70 0.61  CALCIUM 9.9 10.6*  --   --  10.0 10.2  MG  --   --  1.3*  --  1.6* 1.7  PHOS  --   --   --  3.2  --  2.2*   GFR: Estimated Creatinine Clearance: 82.9 mL/min (by C-G formula based on SCr of 0.61 mg/dL). Liver Function Tests: Recent Labs  Lab 12/13/19 1053 11/25/2019 1135 12/17/19 0536 12/18/19 0349  AST 23 29 26  34  ALT 12 13 12 13   ALKPHOS  --  183* 159* 156*  BILITOT 0.8 0.9 1.5* 0.8  PROT 7.1 6.8 6.2* 6.1*  ALBUMIN  --  2.2* 2.0* 2.5*   No results for input(s): LIPASE, AMYLASE in the last 168 hours. No results for input(s): AMMONIA in the last 168 hours. Coagulation Profile: Recent Labs  Lab 11/29/2019 1135  INR 1.4*   Cardiac Enzymes: No results for input(s): CKTOTAL, CKMB, CKMBINDEX, TROPONINI in the last 168 hours. BNP (last 3 results) No results for input(s): PROBNP in the last 8760 hours. HbA1C: Recent Labs    12/10/2019 1714  HGBA1C 6.1*   CBG: Recent Labs  Lab 12/17/19 1553 12/17/19 2002 12/17/19 2335 12/18/19 0345 12/18/19 0729  GLUCAP 99 175* 160* 120* 210*   Lipid Profile: Recent Labs    12/03/2019 1714 12/18/19 0349  CHOL 140 99  HDL 23* 17*  LDLCALC 82 52  TRIG 176* 151*  CHOLHDL 6.1 5.8   Thyroid Function Tests: No results for input(s): TSH, T4TOTAL, FREET4, T3FREE, THYROIDAB in the last 72 hours. Anemia Panel: Recent Labs    12/21/2019 1637 12/18/19 0349  VITAMINB12  --  449  FOLATE  --  10.2  FERRITIN 7,361* >7,500*  TIBC 181* 141*  IRON 65 97  RETICCTPCT  --  0.7   Sepsis Labs: No results for input(s): PROCALCITON, LATICACIDVEN in the last 168 hours.  Recent Results (from the past 240 hour(s))  SARS Coronavirus 2 by RT PCR (hospital order, performed in [facility] hospital lab) Nasopharyngeal Nasopharyngeal Swab     Status: None    Collection Time: 12/24/2019  3:08 PM   Specimen: Nasopharyngeal Swab  Result Value Ref Range Status   SARS Coronavirus 2 NEGATIVE NEGATIVE Final    Comment: (NOTE) SARS-CoV-2 target nucleic acids are NOT DETECTED.  The SARS-CoV-2 RNA is generally detectable in upper and lower respiratory specimens during the acute phase of infection. The lowest concentration of  SARS-CoV-2 viral copies this assay can detect is 250 copies / mL. A negative result does not preclude SARS-CoV-2 infection and should not be used as the sole basis for treatment or other patient management decisions.  A negative result may occur with improper specimen collection / handling, submission of specimen other than nasopharyngeal swab, presence of viral mutation(s) within the areas targeted by this assay, and inadequate number of viral copies (<250 copies / mL). A negative result must be combined with clinical observations, patient history, and epidemiological information.  Fact Sheet for Patients:   StrictlyIdeas.no  Fact Sheet for Healthcare Providers: BankingDealers.co.za  This test is not yet approved or  cleared by the [place] FDA and has been authorized for detection and/or diagnosis of SARS-CoV-2 by FDA under an Emergency Use Authorization (EUA).  This EUA will remain in effect (meaning this test can be used) for the duration of the COVID-19 declaration under Section 564(b)(1) of the Act, 21 U.S.C. section 360bbb-3(b)(1), unless the authorization is terminated or revoked sooner.  Performed at [facility], [place] [address], [place], [place] [address]          Radiology Studies: CT HEAD WO CONTRAST  Result Date: 12/12/2019 CLINICAL DATA:  Slurred speech for the past 3 days with gait disturbance since yesterday. History of 2 previous strokes. EXAM: CT HEAD WITHOUT CONTRAST TECHNIQUE: Contiguous axial images were obtained from the base of the skull  through the vertex without intravenous contrast. COMPARISON:  None. FINDINGS: Brain: Mildly enlarged ventricles and cortical sulci. Mild patchy white matter low density in both cerebral hemispheres. Old right corona radiata lacunar infarct. No intracranial hemorrhage, mass lesion or CT evidence of acute infarction. Vascular: No hyperdense vessel or unexpected calcification. Skull: Normal. Negative for fracture or focal lesion. Sinuses/Orbits: Mild to moderate left maxillary sinus mucosal thickening and minimal right maxillary sinus mucosal thickening. Right ethmoid sinus rounded metallic foreign body compatible with a BB. Other: None. IMPRESSION: 1. No acute abnormality. 2. Mild diffuse cerebral and cerebellar atrophy. 3. Mild chronic small vessel white matter ischemic changes in both cerebral hemispheres. 4. Old right corona radiata lacunar infarct. 5. Mild chronic bilateral maxillary sinusitis. 6. Right ethmoid sinus BB. Electronically Signed   By: Claudie Revering M.D.   On: 12/03/2019 12:26   CT Chest W Contrast  Result Date: 12/18/2019 CLINICAL DATA:  Follow-up abnormal chest x-ray suggesting chest wall mass EXAM: CT CHEST, ABDOMEN, AND PELVIS WITH CONTRAST TECHNIQUE: Multidetector CT imaging of the chest, abdomen and pelvis was performed following the standard protocol during bolus administration of intravenous contrast. CONTRAST:  11mL OMNIPAQUE IOHEXOL 300 MG/ML  SOLN COMPARISON:  Film from earlier in the same day. FINDINGS: CT CHEST FINDINGS Cardiovascular: Atherosclerotic calcifications of the thoracic aorta are noted. No aneurysmal dilatation or dissection is seen. No cardiac enlargement is noted. Pacing device is again seen. The pulmonary artery is enlarged without definitive central filling defect. Mediastinum/Nodes: Thoracic inlet is within normal limits. Scattered small hilar and mediastinal lymph nodes are noted. The esophagus appears within normal limits. Bilateral axillary adenopathy is identified  the largest of these on the right measures 12 mm. The largest of these on the left measures 11 mm. Lungs/Pleura: Emphysematous changes are seen. The left lung is clear. The emphysematous changes are more marked on the right. This contributes to the somewhat lucent area seen on recent chest x-ray. No sizable effusion is seen. Enhancing pleural base lesions are seen throughout the right chest adjacent to the anterior aspect  of the right fourth rib as well as the posterolateral aspect of the right seventh rib and posterior aspect of the right ninth rib. Given the multiplicity of pleural based lesions, metastatic disease deserves consideration. Musculoskeletal: Pathologic fracture is noted involving the posterolateral aspect of the right seventh rib secondary to the pleural based mass. This corresponds with the finding seen on recent chest x-ray. No other definitive pathologic fracture is seen. The vertebral bodies show no definitive compression deformity. CT ABDOMEN PELVIS FINDINGS Hepatobiliary: No focal liver abnormality is seen. No gallstones, gallbladder wall thickening, or biliary dilatation. Pancreas: Unremarkable. No pancreatic ductal dilatation or surrounding inflammatory changes. Spleen: Normal in size without focal abnormality. Adrenals/Urinary Tract: Adrenal glands are within normal limits. Kidneys demonstrate a normal enhancement pattern bilaterally. No renal calculi or obstructive changes are seen. Kidneys demonstrate a normal excretion of contrast bilaterally on delayed images. The bladder is partially decompressed. Stomach/Bowel: Colon shows no obstructive or inflammatory changes. The appendix is well visualized and within normal limits. No small bowel or gastric abnormality is noted. Vascular/Lymphatic: Atherosclerotic calcifications of the abdominal aorta are seen without definitive aneurysmal dilatation. Considerable retroperitoneal adenopathy is noted in the Peri aortic and intra-aortocaval region.  The largest area is noted adjacent to aortic bifurcation measuring approximately 4.3 x 1.9 cm in greatest dimension. No significant inguinal adenopathy is noted. Bilateral common iliac adenopathy is seen as well. Reproductive: Prostate is unremarkable. Other: Minimal free fluid is noted which may be physiologic in nature. Musculoskeletal: Degenerative changes of lumbar spine are noted. There is a moth-eaten pattern identified within the right iliac bone adjacent to the sacroiliac joint as well as an area of bony destruction in the left half of the sacrum best seen on image number 105 of series 4. Soft tissue nodule is noted in the right buttock best seen on image number 85 of series 4 which measures 17 mm in greatest dimension. This may be related to the underlying diffuse process although may be incidental. IMPRESSION: Constellation of findings as described above consistent with widespread neoplastic involvement. Given the lymphadenopathy in the possibility of lymphoma is felt to be most likely. Node biopsy would be helpful. Additionally PET-CT imaging is recommended for further evaluation. Electronically Signed   By: Inez Catalina M.D.   On: 12/12/2019 15:13   CT ABDOMEN PELVIS W CONTRAST  Result Date: 12/04/2019 CLINICAL DATA:  Follow-up abnormal chest x-ray suggesting chest wall mass EXAM: CT CHEST, ABDOMEN, AND PELVIS WITH CONTRAST TECHNIQUE: Multidetector CT imaging of the chest, abdomen and pelvis was performed following the standard protocol during bolus administration of intravenous contrast. CONTRAST:  158mL OMNIPAQUE IOHEXOL 300 MG/ML  SOLN COMPARISON:  Film from earlier in the same day. FINDINGS: CT CHEST FINDINGS Cardiovascular: Atherosclerotic calcifications of the thoracic aorta are noted. No aneurysmal dilatation or dissection is seen. No cardiac enlargement is noted. Pacing device is again seen. The pulmonary artery is enlarged without definitive central filling defect. Mediastinum/Nodes:  Thoracic inlet is within normal limits. Scattered small hilar and mediastinal lymph nodes are noted. The esophagus appears within normal limits. Bilateral axillary adenopathy is identified the largest of these on the right measures 12 mm. The largest of these on the left measures 11 mm. Lungs/Pleura: Emphysematous changes are seen. The left lung is clear. The emphysematous changes are more marked on the right. This contributes to the somewhat lucent area seen on recent chest x-ray. No sizable effusion is seen. Enhancing pleural base lesions are seen throughout the right chest adjacent to the  anterior aspect of the right fourth rib as well as the posterolateral aspect of the right seventh rib and posterior aspect of the right ninth rib. Given the multiplicity of pleural based lesions, metastatic disease deserves consideration. Musculoskeletal: Pathologic fracture is noted involving the posterolateral aspect of the right seventh rib secondary to the pleural based mass. This corresponds with the finding seen on recent chest x-ray. No other definitive pathologic fracture is seen. The vertebral bodies show no definitive compression deformity. CT ABDOMEN PELVIS FINDINGS Hepatobiliary: No focal liver abnormality is seen. No gallstones, gallbladder wall thickening, or biliary dilatation. Pancreas: Unremarkable. No pancreatic ductal dilatation or surrounding inflammatory changes. Spleen: Normal in size without focal abnormality. Adrenals/Urinary Tract: Adrenal glands are within normal limits. Kidneys demonstrate a normal enhancement pattern bilaterally. No renal calculi or obstructive changes are seen. Kidneys demonstrate a normal excretion of contrast bilaterally on delayed images. The bladder is partially decompressed. Stomach/Bowel: Colon shows no obstructive or inflammatory changes. The appendix is well visualized and within normal limits. No small bowel or gastric abnormality is noted. Vascular/Lymphatic:  Atherosclerotic calcifications of the abdominal aorta are seen without definitive aneurysmal dilatation. Considerable retroperitoneal adenopathy is noted in the Peri aortic and intra-aortocaval region. The largest area is noted adjacent to aortic bifurcation measuring approximately 4.3 x 1.9 cm in greatest dimension. No significant inguinal adenopathy is noted. Bilateral common iliac adenopathy is seen as well. Reproductive: Prostate is unremarkable. Other: Minimal free fluid is noted which may be physiologic in nature. Musculoskeletal: Degenerative changes of lumbar spine are noted. There is a moth-eaten pattern identified within the right iliac bone adjacent to the sacroiliac joint as well as an area of bony destruction in the left half of the sacrum best seen on image number 105 of series 4. Soft tissue nodule is noted in the right buttock best seen on image number 85 of series 4 which measures 17 mm in greatest dimension. This may be related to the underlying diffuse process although may be incidental. IMPRESSION: Constellation of findings as described above consistent with widespread neoplastic involvement. Given the lymphadenopathy in the possibility of lymphoma is felt to be most likely. Node biopsy would be helpful. Additionally PET-CT imaging is recommended for further evaluation. Electronically Signed   By: Inez Catalina M.D.   On: 11/30/2019 15:13   DG Chest Portable 1 View  Result Date: 12/06/2019 CLINICAL DATA:  53 year old male with history of shortness of breath. EXAM: PORTABLE CHEST 1 VIEW COMPARISON:  Chest x-ray 11/04/2019. FINDINGS: Previously noted right upper lobe nodule is less apparent on today's examination. There is an increasing vague masslike density projecting over the lateral aspect of the right mid hemithorax. Probable expansile lytic lesion in the posterolateral aspect of the right seventh rib where there is a nondisplaced fracture. Left lung is clear. No pleural effusions. No  evidence of pulmonary edema. Heart size is normal. Upper mediastinal contours are within normal limits. 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 IMPRESSION: 1. Unusual findings in the right hemithorax concerning for potential malignancy. Specifically, there appears to be an expansile lytic lesion in the posterolateral right seventh rib with probable nondisplaced pathologic fracture and adjacent ill-defined opacity in the right mid hemithorax. These findings should be better evaluated with follow-up contrast enhanced chest CT in the near future to exclude underlying malignancy. Electronically Signed   By: Vinnie Langton M.D.   On: 12/06/2019 12:43  Scheduled Meds:  (feeding supplement) PROSource Plus  30 mL Oral BID BM   amLODipine  10 mg Oral Daily   atorvastatin  20 mg Oral Daily   bictegravir-emtricitabine-tenofovir AF  1 tablet Oral Daily   enoxaparin (LOVENOX) injection  40 mg Subcutaneous Q24H   feeding supplement (ENSURE ENLIVE)  237 mL Oral TID BM   folic acid  1 mg Oral QPM   insulin aspart  0-9 Units Subcutaneous Q4H   megestrol  800 mg Oral Daily   metoprolol succinate  25 mg Oral QPM   nystatin  5 mL Oral QID   risperiDONE  2 mg Oral QHS   Continuous Infusions:  dextrose 5 % and 0.9% NaCl 50 mL/hr at 12/18/19 0600     LOS: 2 days    Time spent:40 min    Naod Sweetland, Geraldo Docker, MD [facility] Pager [phone]  If 7PM-7AM, please contact night-coverage [email or web address] Password [facility] 12/18/2019, 9:24 AM

## 2019-12-18 NOTE — Progress Notes (Signed)
Manufacturing engineer Documentation  Liaison received referral for pt to participate in our outpt palliative program. Unable to make contact with pt.   [place] liaison team will continue to follow pt to inform [facility] palliative team of dc so that admission visit can be arranged.   Please do not hesitate to outreach with any questions and thank you for the referral.   Freddie Breech, RN  [facility] Liaison [phone]

## 2019-12-18 NOTE — Care Management (Signed)
Outpatient palliative referral made to Bradd Canary with [employer].

## 2019-12-18 NOTE — Consult Note (Signed)
Pt alert and aware sitting up in bed. I explained my reason for being there. I asked he he would like the chaplain to offered prayers. Pt said yes.  The chaplain will refer pt to another chaplain to work on [place]. Further support from spiritual care will be offered.

## 2019-12-18 NOTE — Plan of Care (Signed)

## 2019-12-19 ENCOUNTER — Inpatient Hospital Stay (HOSPITAL_COMMUNITY): Payer: Medicaid Other

## 2019-12-19 DIAGNOSIS — E43 Unspecified severe protein-calorie malnutrition: Secondary | ICD-10-CM

## 2019-12-19 DIAGNOSIS — B370 Candidal stomatitis: Secondary | ICD-10-CM

## 2019-12-19 DIAGNOSIS — R7989 Other specified abnormal findings of blood chemistry: Secondary | ICD-10-CM

## 2019-12-19 LAB — COMPREHENSIVE METABOLIC PANEL
ALT: 16 U/L (ref 0–44)
AST: 39 U/L (ref 15–41)
Albumin: 2.1 g/dL — ABNORMAL LOW (ref 3.5–5.0)
Alkaline Phosphatase: 169 U/L — ABNORMAL HIGH (ref 38–126)
Anion gap: 16 — ABNORMAL HIGH (ref 5–15)
BUN: 9 mg/dL (ref 6–20)
CO2: 24 mmol/L (ref 22–32)
Calcium: 10.3 mg/dL (ref 8.9–10.3)
Chloride: 96 mmol/L — ABNORMAL LOW (ref 98–111)
Creatinine, Ser: 0.74 mg/dL (ref 0.61–1.24)
GFR calc Af Amer: >60 mL/min (ref >60)
GFR calc non Af Amer: >60 mL/min (ref >60)
Glucose, Bld: 101 mg/dL — ABNORMAL HIGH (ref 70–99)
Potassium: 4.2 mmol/L (ref 3.5–5.1)
Sodium: 136 mmol/L (ref 135–145)
Total Bilirubin: 0.8 mg/dL (ref 0.3–1.2)
Total Protein: 6 g/dL — ABNORMAL LOW (ref 6.5–8.1)

## 2019-12-19 LAB — CBC WITH DIFFERENTIAL/PLATELET
Abs Immature Granulocytes: 0.81 10*3/uL — ABNORMAL HIGH (ref 0.00–0.07)
Basophils Absolute: 0.1 10*3/uL (ref 0.0–0.1)
Basophils Relative: 1 %
Eosinophils Absolute: 0.1 10*3/uL (ref 0.0–0.5)
Eosinophils Relative: 2 %
HCT: 26.1 % — ABNORMAL LOW (ref 39.0–52.0)
Hemoglobin: 8.5 g/dL — ABNORMAL LOW (ref 13.0–17.0)
Immature Granulocytes: 15 %
Lymphocytes Relative: 24 %
Lymphs Abs: 1.3 10*3/uL (ref 0.7–4.0)
MCH: 30 pg (ref 26.0–34.0)
MCHC: 32.6 g/dL (ref 30.0–36.0)
MCV: 92.2 fL (ref 80.0–100.0)
Monocytes Absolute: 0.2 10*3/uL (ref 0.1–1.0)
Monocytes Relative: 4 %
Neutro Abs: 3 10*3/uL (ref 1.7–7.7)
Neutrophils Relative %: 54 %
Platelets: 70 10*3/uL — ABNORMAL LOW (ref 150–400)
RBC: 2.83 MIL/uL — ABNORMAL LOW (ref 4.22–5.81)
RDW: 14.5 % (ref 11.5–15.5)
WBC: 5.6 10*3/uL (ref 4.0–10.5)
nRBC: 0.7 % — ABNORMAL HIGH (ref 0.0–0.2)

## 2019-12-19 LAB — GLUCOSE, CAPILLARY
Glucose-Capillary: 105 mg/dL — ABNORMAL HIGH (ref 70–99)
Glucose-Capillary: 107 mg/dL — ABNORMAL HIGH (ref 70–99)
Glucose-Capillary: 108 mg/dL — ABNORMAL HIGH (ref 70–99)
Glucose-Capillary: 117 mg/dL — ABNORMAL HIGH (ref 70–99)
Glucose-Capillary: 146 mg/dL — ABNORMAL HIGH (ref 70–99)
Glucose-Capillary: 161 mg/dL — ABNORMAL HIGH (ref 70–99)
Glucose-Capillary: 220 mg/dL — ABNORMAL HIGH (ref 70–99)

## 2019-12-19 LAB — PHOSPHORUS: Phosphorus: 3.5 mg/dL (ref 2.5–4.6)

## 2019-12-19 LAB — MAGNESIUM: Magnesium: 1.5 mg/dL — ABNORMAL LOW (ref 1.7–2.4)

## 2019-12-19 MED ORDER — GADOBUTROL 1 MMOL/ML IV SOLN
5.0000 mL | Freq: Once | INTRAVENOUS | Status: AC | PRN
  Administered 2019-12-19: 5 mL via INTRAVENOUS

## 2019-12-19 MED ORDER — SODIUM CHLORIDE 0.9% FLUSH
10.0000 mL | INTRAVENOUS | Status: DC | PRN
  Administered 2019-12-19: 10 mL

## 2019-12-19 MED ORDER — METOPROLOL TARTRATE 5 MG/5ML IV SOLN
5.0000 mg | Freq: Four times a day (QID) | INTRAVENOUS | Status: DC | PRN
  Administered 2019-12-24: 5 mg via INTRAVENOUS
  Filled 2019-12-19: qty 5

## 2019-12-19 MED ORDER — LIVING WELL WITH DIABETES BOOK
Freq: Once | Status: AC
  Filled 2019-12-19: qty 1

## 2019-12-19 MED ORDER — DRONABINOL 2.5 MG PO CAPS
5.0000 mg | ORAL_CAPSULE | Freq: Two times a day (BID) | ORAL | Status: DC
  Administered 2019-12-19 – 2019-12-26 (×12): 5 mg via ORAL
  Filled 2019-12-19 (×12): qty 2

## 2019-12-19 MED ORDER — SODIUM CHLORIDE 0.9% FLUSH
10.0000 mL | Freq: Two times a day (BID) | INTRAVENOUS | Status: DC
  Administered 2019-12-19 – 2019-12-24 (×9): 10 mL
  Administered 2019-12-24: 20 mL
  Administered 2019-12-25 – 2019-12-26 (×3): 10 mL

## 2019-12-19 MED ORDER — IOHEXOL 350 MG/ML SOLN
75.0000 mL | Freq: Once | INTRAVENOUS | Status: AC | PRN
  Administered 2019-12-19: 75 mL via INTRAVENOUS

## 2019-12-19 MED ORDER — HEPARIN BOLUS VIA INFUSION
3500.0000 [IU] | Freq: Once | INTRAVENOUS | Status: DC
  Filled 2019-12-19: qty 3500

## 2019-12-19 MED ORDER — HEPARIN (PORCINE) 25000 UT/250ML-% IV SOLN: 1000.0000 [IU]/h | INTRAVENOUS | Status: DC

## 2019-12-19 MED ORDER — MAGNESIUM SULFATE 50 % IJ SOLN
3.0000 g | Freq: Once | INTRAVENOUS | Status: AC
  Administered 2019-12-19: 3 g via INTRAVENOUS
  Filled 2019-12-19: qty 6

## 2019-12-19 MED ORDER — ENOXAPARIN SODIUM 60 MG/0.6ML ~~LOC~~ SOLN
1.0000 mg/kg | Freq: Two times a day (BID) | SUBCUTANEOUS | Status: DC
  Administered 2019-12-19 – 2019-12-20 (×2): 60 mg via SUBCUTANEOUS
  Filled 2019-12-19 (×2): qty 0.6

## 2019-12-19 NOTE — Progress Notes (Signed)
Inpatient Diabetes Program Recommendations  AACE/ADA: New Consensus Statement on Inpatient Glycemic Control (2015)  Target Ranges:  Prepandial:   less than 140 mg/dL      Peak postprandial:   less than 180 mg/dL (1-2 hours)      Critically ill patients:  140 - 180 mg/dL   Lab Results  Component Value Date   GLUCAP 161 (H) 12/19/2019   HGBA1C 6.1 (H) 12/22/2019    Review of Glycemic Control Results for JUDDSON, COBERN (MRN [id number]) as of 12/19/2019 10:22  Ref. Range 12/18/2019 15:26 12/18/2019 19:56 12/19/2019 00:12 12/19/2019 04:28 12/19/2019 08:25  Glucose-Capillary Latest Ref Range: 70 - 99 mg/dL 207 (H) 123 (H) 117 (H) 108 (H) 161 (H)   Diabetes history:  New pre-DM2 Outpatient Diabetes medications:  none Current orders for Inpatient glycemic control:  Novolog 0-9 units q4h  Inpatient Diabetes Program Recommendations:     Novolog 0-9 units tid & 0-5 units qhs  Will speak with patient today about new pre-DM2 and Metformin.  Will continue to follow while inpatient.  Thank you, Reche Dixon, RN, BSN Diabetes Coordinator Inpatient Diabetes Program [phone] (team pager from 8a-5p)

## 2019-12-19 NOTE — Progress Notes (Signed)
PT Cancellation Note  Patient Details Name: TANAY MISURACA MRN: [id number] DOB: [birth year]   Cancelled Treatment:    Reason Eval/Treat Not Completed: Patient at procedure or test/unavailable. Pt off the floor at MRI. PT to return as able, as appropriate.  Kittie Plater, PT, DPT Acute Rehabilitation Services Pager #: [phone] Office #: [phone]    Berline Lopes 12/19/2019, 10:17 AM

## 2019-12-19 NOTE — Progress Notes (Signed)
PROGRESS NOTE    Mike Holland  BSJ:[id number] DOB: 1966-04-09 DOA: 12/23/2019 PCP: Gildardo Pounds, NP     Brief Narrative:  Mike Holland is a 54 y.o. BM PMHx HIV-diagnosed in 1996, CVA, HTN, HLD, Depression, Tachybradycardia syndrome?    S/p Medtronic Pacemaker placement in July 2018   Presents to emergency department with slurred speech and shuffling gait since 3 days.   Patient tells me that he has history of CVA.  He started having slurred speech and shuffling gait/difficulty in walking since 3 days.  He tells me that today his symptoms are getting better.  He denies headache, blurry vision, facial drop, seizures, loss of consciousness, lightheadedness, dizziness, numbness weakness tingling sensation in hands or feet, fall or head trauma.  Reports unintentional weight loss of 20 pounds in 1 month, generalized weakness, lethargic, decreased appetite.  He is followed by infectious disease outpatient for history of HIV.  He was seen by Dr. Belenda Cruise on 7/20-Genvoya was discontinued due to weight loss and was started on Biktarvy and megestrol.  He takes Valtrex for prophylaxis.  He denies fever, chills, nausea, vomiting, diarrhea, abdominal pain, chest pain, shortness of breath, palpitation, leg swelling, urinary or sleep changes.  He tells me that he has chronic constipation and has bowel movement every 4 to 5 days.  He lives alone at home.  Moved from [place] on November 2020.  Denies cigarette smoking, alcohol however uses marijuana 3 times daily to help with appetite.  He tells me pacemaker was placed in July 2018 due to irregular heart rate.  ED Course: Upon arrival to ED: Patient slightly tachycardic with heart rate in 105, afebrile, maintaining oxygen saturation on room air, WBC: 12.1, H&H: 9.9/31.1, ethanol: WNL, CMP shows potassium of 2.7, calcium: 10.6, UDS positive for marijuana, UA negative for infection.  Magnesium: 1.3.  CT head came back negative for acute findings.   MRI was not obtained due to pacemaker.  Neurology consulted.  Chest x-ray, CT abdomen/CT chest was obtained which shows findings consistent with widespread neoplastic involvement-concerning for lymphoma.  EDP consulted oncology.  [facility] hospital[facility] consulted for admission.  Apparently: Patient's pacemaker is conditional for MRI and will need one of the trained MRI technician to change pacemaker settings into MRI safe mode however there is no staff available today that are trained to do this.    Subjective: 7/26 A/O x4, negative CP, negative S OB, positive DOE, negative abdominal pain.  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  Unable to obtain MRI due to pacer.  -Per EMR neurology consulted by EDP.  No further stroke work-up warranted at this time given patient's findings on abdominal/chest CT for metastatic neoplasm. -After discussing case with cardiology hold off on MRI until Dr. Caryl Comes EP has a chance to evaluate patient.  We will then reorder MRI if he deems safe. -7/25 discussed case with Dr. Virl Axe  cardiology EP and he states that patient is safe to go ahead with his head MRI, radiology has a whole protocol already established for people with Medtronic pacers who require MRIs.  Prediabetes -7/23 hemoglobin A1c= 6.1, patient meets guidelines for prediabetes -Sensitive SSI for now -Prior to  discharge will start him on Metformin -7/25 consult to diabetic coordinator new diagnosis of prediabetes -7/25 consult to nutrition diagnosis of prediabetes  HLD -Atorvastatin 20 mg daily -Lipid panel pending   Widespread neoplasm -CT abdomen/pelvis concerning for lymphoma.  Discussed case with Dr. Narda Rutherford Medical Oncology who is agreed to evaluate patient today. -Per oncology recommendations have ordered LDH, Fflow Cytometry -Have requested [facility] perform excisional biopsy of RIGHT axillary lymph node per oncology request.  PA Will from [facility] has agreed to see patient today  RIGHT seventh rib pathologic fracture -Likely secondary to malignancy/lymphoma -Currently negative pain  HIV -Dr. Belenda Cruise from ID follows patient.  Last seen 7/20  - Biktarvy 50-2 100-25 mg 1 tablet daily -Valtrex for prophylaxis (not taking per patient)  Tachybradycardia syndrome -S/p Medtronic Pacemaker placement  2018 July  -7/25 patient sinus tachycardia already received medication see HTN. -7/25 Amiodarone drip  Essential HTN/Hypotensive -Patient's BP on the low side, appears secondary to dehydration and malnutrition -Norvasc 10 mg daily -Toprol 25 mg daily -Albumin 50 g -D5-0.9% saline 61ml/hr  Normocytic anemia: -H&H dropped from 11.2/33.3-9.9/31.3 in 1 month. -Occult blood pending -Anemia panel consistent with normocytic anemia.  Patient has extremely high ferritin since ferritin is a inflammatory marker would be consistent with malignancy  Hypokalemia -Potassium goal > 4 -Stable   Hypomagnesmia -Magnesium goal > 2 -Magnesium IV 3 g  Marijuana abuse -Patient tells me that he uses marijuana 3 times daily to help with his appetite.  Oral candidiasis -Likely secondary to HIV -Nystatin QID  Moderate to severe protein calorie malnutrition -: BMI of 19 -History of unintentional weight loss.   -Consult nutritionist -Megace 800 mg daily  Depression/Bipolar:  -Patient not on  medication for depression or bipolar. -Continue patient's antipsychotic medication risperidone 2 mg daily  Pulmonary embolus -Trend D-dimer Recent Labs  Lab 12/18/19 1811  DDIMER 9.71*  -7/25 CT chest PE protocol pending -7/25 treatment dose Lovenox   Goals of care -7/24 palliative care consult; patient with HIV, metastatic neoplasm change of CODE STATUS, short and long-term goals of care   DVT prophylaxis: Lovenox treatment dose Code Status: DNR Family Communication:  Status is: Inpatient    Dispo: The patient is from: Home              Anticipated d/c is to: Home              Anticipated d/c date is: 7/30              Patient currently unstable      Consultants:  Medical oncology Dr. Narda Rutherford [facility] PA Will Palliative care Cardiology EP Dr. Virl Axe     Procedures/Significant Events:  CT chest W contrast; Cardiovascular:-pulmonary artery is enlarged without definitive central filling defect. Mediastinum/Nodes:-Scattered small hilar and mediastinal lymph nodes are noted.  -Bilateral axillary adenopathy is identified the largest of these on the right measures 12 mm. The largest of these on the left measures 11 mm. Lungs/Pleura: Emphysematous changes are seen. more marked on the right.  -Enhancing pleural base lesions are seen throughout the right chest adjacent to the anterior aspect of the right fourth rib as well as the posterolateral aspect of the right seventh  rib and posterior aspect of the right ninth rib. Given the multiplicity of pleural based lesions, metastatic disease deserves consideration.  Musculoskeletal: Pathologic fracture is noted involving the posterolateral aspect of the right seventh rib secondary to the pleural based mass. This corresponds with the finding seen on recent chest x-ray. No other definitive pathologic fracture is seen. The vertebral bodies show no definitive compression deformity.  CT ABDOMEN PELVIS FINDINGS The bladder  is partially decompressed.   Vascular/Lymphatic: Atherosclerotic calcifications of the abdominal aorta are seen without definitive aneurysmal dilatation. Considerable retroperitoneal adenopathy is noted in the Peri aortic and intra-aortocaval region. The largest area is noted adjacent to aortic bifurcation measuring approximately 4.3 x 1.9 cm in greatest dimension. No significant inguinal adenopathy is noted. Bilateral common iliac adenopathy is seen as well.  Reproductive: Prostate is unremarkable.  Other: Minimal free fluid is noted which may be physiologic in nature.  Musculoskeletal: Degenerative changes of lumbar spine are noted. There is a moth-eaten pattern identified within the right iliac bone adjacent to the sacroiliac joint as well as an area of bony destruction in the left half of the sacrum best seen on image number 105 of series 4. Soft tissue nodule is noted in the right buttock best seen on image number 85 of series 4 which measures 17 mm in greatest dimension. This may be related to the underlying diffuse process although may be incidental.  IMPRESSION: Constellation of findings as described above consistent with widespread neoplastic involvement. Given the lymphadenopathy in the possibility of lymphoma is felt to be most likely. Node biopsy would be helpful. Additionally PET-CT imaging is recommended for further evaluation.  I have personally reviewed and interpreted all radiology studies and my findings are as above.  VENTILATOR SETTINGS:    Cultures   Antimicrobials:    Devices Permanent pacemaker 2018>>>>   LINES / TUBES:      Continuous Infusions: . amiodarone 30 mg/hr (12/18/19 2235)  . dextrose 5 % and 0.9% NaCl 50 mL/hr at 12/18/19 2124     Objective: Vitals:   12/19/19 0400 12/19/19 0500 12/19/19 0823 12/19/19 0827  BP: 121/76  121/78 112/73  Pulse: 101  99 98  Resp: (!) 33   (!) 28  Temp: 97.9 F (36.6 C)   98.3 F  (36.8 C)  TempSrc: Oral   Oral  SpO2: 96%   94%  Weight:  59.4 kg      Intake/Output Summary (Last 24 hours) at 12/19/2019 1157 Last data filed at 12/19/2019 6144 Gross per 24 hour  Intake --  Output 650 ml  Net -650 ml   Filed Weights   11/26/2019 2152 12/18/19 0500 12/19/19 0500  Weight: 54.9 kg 54.9 kg 59.4 kg   Physical Exam:  General: A/O x4, positive acute respiratory distress, positive DOE, cachectic Eyes: negative scleral hemorrhage, negative anisocoria, negative icterus ENT: Negative Runny nose, negative gingival bleeding, Neck:  Negative scars, masses, torticollis, lymphadenopathy, JVD Lungs: tachypneic clear to auscultation bilaterally without wheezes or crackles Cardiovascular: Tachycardic without murmur gallop or rub normal S1 and S2 Abdomen: negative abdominal pain, nondistended, positive soft, bowel sounds, no rebound, no ascites, no appreciable mass Extremities: No significant cyanosis, clubbing, or edema bilateral lower extremities Skin: Negative rashes, lesions, ulcers Psychiatric:  Negative depression, negative anxiety, negative fatigue, negative mania  Central nervous system:  Cranial nerves II through XII intact, tongue/uvula midline, all extremities muscle strength 5/5, sensation intact throughout, negative dysarthria, negative expressive aphasia, negative receptive aphasia. Marland Kitchen Marland Kitchen  Data Reviewed: Care during the described time interval was provided by me .  I have reviewed this patient's available data, including medical history, events of note, physical examination, and all test results as part of my evaluation.  CBC: Recent Labs  Lab 11/27/2019 1135 12/20/2019 1637 12/17/19 0536 12/18/19 0349 12/19/19 0516  WBC 12.1* 10.4 9.0 6.9 5.6  NEUTROABS 7.4  --   --  4.3 3.0  HGB 9.9* 9.2* 9.4* 8.8* 8.5*  HCT 31.3* 28.9* 28.9* 27.2* 26.1*  MCV 92.3 92.0 92.0 92.2 92.2  PLT 156 140* 136* 101* 70*   Basic Metabolic Panel: Recent Labs  Lab 12/13/19 1053  12/19/2019 1135 12/18/2019 1300 11/26/2019 1637 12/17/19 0536 12/18/19 0349 12/19/19 0516  NA 132* 134*  --   --  137 136 136  K 3.1* 2.7*  --   --  3.0* 4.3 4.2  CL 88* 93*  --   --  95* 98 96*  CO2 26 24  --   --  26 25 24   GLUCOSE 116* 113*  --   --  68* 128* 101*  BUN 17 7  --   --  6 7 9   CREATININE 0.76 0.71  --  0.58* 0.70 0.61 0.74  CALCIUM 9.9 10.6*  --   --  10.0 10.2 10.3  MG  --   --  1.3*  --  1.6* 1.7 1.5*  PHOS  --   --   --  3.2  --  2.2* 3.5   GFR: Estimated Creatinine Clearance: 89.7 mL/min (by C-G formula based on SCr of 0.74 mg/dL). Liver Function Tests: Recent Labs  Lab 12/13/19 1053 12/17/2019 1135 12/17/19 0536 12/18/19 0349 12/19/19 0516  AST 23 29 26  34 39  ALT 12 13 12 13 16   ALKPHOS  --  183* 159* 156* 169*  BILITOT 0.8 0.9 1.5* 0.8 0.8  PROT 7.1 6.8 6.2* 6.1* 6.0*  ALBUMIN  --  2.2* 2.0* 2.5* 2.1*   No results for input(s): LIPASE, AMYLASE in the last 168 hours. No results for input(s): AMMONIA in the last 168 hours. Coagulation Profile: Recent Labs  Lab 12/20/2019 1135  INR 1.4*   Cardiac Enzymes: No results for input(s): CKTOTAL, CKMB, CKMBINDEX, TROPONINI in the last 168 hours. BNP (last 3 results) No results for input(s): PROBNP in the last 8760 hours. HbA1C: Recent Labs    12/10/2019 1714  HGBA1C 6.1*   CBG: Recent Labs  Lab 12/18/19 1526 12/18/19 1956 12/19/19 0012 12/19/19 0428 12/19/19 0825  GLUCAP 207* 123* 117* 108* 161*   Lipid Profile: Recent Labs    12/07/2019 1714 12/18/19 0349  CHOL 140 99  HDL 23* 17*  LDLCALC 82 52  TRIG 176* 151*  CHOLHDL 6.1 5.8   Thyroid Function Tests: No results for input(s): TSH, T4TOTAL, FREET4, T3FREE, THYROIDAB in the last 72 hours. Anemia Panel: Recent Labs    12/03/2019 1637 12/18/19 0349  VITAMINB12  --  449  FOLATE  --  10.2  FERRITIN 7,361* >7,500*  TIBC 181* 141*  IRON 65 97  RETICCTPCT  --  0.7   Sepsis Labs: No results for input(s): PROCALCITON, LATICACIDVEN in the  last 168 hours.  Recent Results (from the past 240 hour(s))  SARS Coronavirus 2 by RT PCR (hospital order, performed in [facility] hospital lab) Nasopharyngeal Nasopharyngeal Swab     Status: None   Collection Time: 12/05/2019  3:08 PM   Specimen: Nasopharyngeal Swab  Result Value Ref Range Status  SARS Coronavirus 2 NEGATIVE NEGATIVE Final    Comment: (NOTE) SARS-CoV-2 target nucleic acids are NOT DETECTED.  The SARS-CoV-2 RNA is generally detectable in upper and lower respiratory specimens during the acute phase of infection. The lowest concentration of SARS-CoV-2 viral copies this assay can detect is 250 copies / mL. A negative result does not preclude SARS-CoV-2 infection and should not be used as the sole basis for treatment or other patient management decisions.  A negative result may occur with improper specimen collection / handling, submission of specimen other than nasopharyngeal swab, presence of viral mutation(s) within the areas targeted by this assay, and inadequate number of viral copies (<250 copies / mL). A negative result must be combined with clinical observations, patient history, and epidemiological information.  Fact Sheet for Patients:   StrictlyIdeas.no  Fact Sheet for Healthcare Providers: BankingDealers.co.za  This test is not yet approved or  cleared by the [place] FDA and has been authorized for detection and/or diagnosis of SARS-CoV-2 by FDA under an Emergency Use Authorization (EUA).  This EUA will remain in effect (meaning this test can be used) for the duration of the COVID-19 declaration under Section 564(b)(1) of the Act, 21 U.S.C. section 360bbb-3(b)(1), unless the authorization is terminated or revoked sooner.  Performed at [facility], [place] [address], [place], [place] [address]   MRSA PCR Screening     Status: None   Collection Time: 12/18/19 10:25 PM   Specimen: Nasal Mucosa;  Nasopharyngeal  Result Value Ref Range Status   MRSA by PCR NEGATIVE NEGATIVE Final    Comment:        The GeneXpert MRSA Assay (FDA approved for NASAL specimens only), is one component of a comprehensive MRSA colonization surveillance program. It is not intended to diagnose MRSA infection nor to guide or monitor treatment for MRSA infections. Performed at [facility], [place] [address] [place], [place] [address]          Radiology Studies: DG CHEST PORT 1 VIEW  Result Date: 12/18/2019 CLINICAL DATA:  Shortness of breath, history hypertension, HIV EXAM: PORTABLE CHEST 1 VIEW COMPARISON:  Portable exam 1403 hours compared to 12/07/2019 Correlation: CT chest 11/28/2019 FINDINGS: LEFT subclavian sequential transvenous pacemaker leads project at RIGHT atrium and RIGHT ventricle. Normal heart size and pulmonary vascularity. BILATERAL hilar enlargement.  Mild bibasilar atelectasis. Upper lungs clear. No pleural effusion or pneumothorax. Extrapleural density at lateral lower RIGHT chest, by CT corresponding to rib lesions as noted on prior CT. IMPRESSION: Bibasilar atelectasis. BILATERAL hilar and AP window enlargement, corresponding to enlarged pulmonary arteries on prior CT question pulmonary arterial hypertension. Electronically Signed   By: Lavonia Dana M.D.   On: 12/18/2019 14:31        Scheduled Meds: . (feeding supplement) PROSource Plus  30 mL Oral BID BM  . amLODipine  10 mg Oral Daily  . atorvastatin  20 mg Oral Daily  . bictegravir-emtricitabine-tenofovir AF  1 tablet Oral Daily  . feeding supplement (ENSURE ENLIVE)  237 mL Oral TID BM  . folic acid  1 mg Oral QPM  . insulin aspart  0-9 Units Subcutaneous Q4H  . megestrol  800 mg Oral Daily  . metoprolol succinate  25 mg Oral QPM  . nystatin  5 mL Oral QID  . risperiDONE  2 mg Oral QHS   Continuous Infusions: . amiodarone 30 mg/hr (12/18/19 2235)  . dextrose 5 % and 0.9% NaCl 50 mL/hr at 12/18/19 2124  LOS:  3 days    Time spent:40 min    Murle Otting, Geraldo Docker, MD [facility] Pager [phone]  If 7PM-7AM, please contact night-coverage [email or web address] Password [facility] 12/19/2019, 11:57 AM

## 2019-12-19 NOTE — Progress Notes (Signed)
[facility] Progress Note     Subjective: CC:  Denies pain. Reports stiffness and weakness.   Objective: Vital signs in last 24 hours: Temp:  [97.6 F (36.4 C)-98.3 F (36.8 C)] 98.3 F (36.8 C) (07/26 0827) Pulse Rate:  [96-120] 98 (07/26 0827) Resp:  [21-40] 28 (07/26 0827) BP: (108-130)/(72-95) 112/73 (07/26 0827) SpO2:  [94 %-97 %] 94 % (07/26 0827) Weight:  [59.4 kg] 59.4 kg (07/26 0500) Last BM Date: 12/12/19  Intake/Output from previous day: 07/25 0701 - 07/26 0700 In: 240 [P.O.:240] Out: 850 [Urine:850] Intake/Output this shift: No intake/output data recorded.  PE: Gen:  Alert, NAD, chronically ill appearing  Card:  Regular rate and rhythm, pedal pulses 2+ BL Pulm:  Normal effort, clear to auscultation bilaterally Abd: Soft, non-tender, non-distended Skin: warm and dry, no rashes; palpable R posterior cervical lymph node ~1-2cm in size. No palpable L axillary nodes, possible small R axillary node palpated on exam.  Psych: A&Ox3   Lab Results:  Recent Labs    12/18/19 0349 12/19/19 0516  WBC 6.9 5.6  HGB 8.8* 8.5*  HCT 27.2* 26.1*  PLT 101* 70*   BMET Recent Labs    12/18/19 0349 12/19/19 0516  NA 136 136  K 4.3 4.2  CL 98 96*  CO2 25 24  GLUCOSE 128* 101*  BUN 7 9  CREATININE 0.61 0.74  CALCIUM 10.2 10.3   PT/INR Recent Labs    12/11/2019 1135  LABPROT 16.9*  INR 1.4*   CMP     Component Value Date/Time   NA 136 12/19/2019 0516   NA 139 11/15/2019 1004   K 4.2 12/19/2019 0516   CL 96 (L) 12/19/2019 0516   CO2 24 12/19/2019 0516   GLUCOSE 101 (H) 12/19/2019 0516   BUN 9 12/19/2019 0516   BUN 13 11/15/2019 1004   CREATININE 0.74 12/19/2019 0516   CREATININE 0.76 12/13/2019 1053   CALCIUM 10.3 12/19/2019 0516   PROT 6.0 (L) 12/19/2019 0516   PROT 7.4 11/15/2019 1004   ALBUMIN 2.1 (L) 12/19/2019 0516   ALBUMIN 3.4 (L) 11/15/2019 1004   AST 39 12/19/2019 0516   AST 22 06/22/2019 1004   ALT 16 12/19/2019 0516   ALT  13 06/22/2019 1004   ALKPHOS 169 (H) 12/19/2019 0516   BILITOT 0.8 12/19/2019 0516   BILITOT 0.3 11/15/2019 1004   BILITOT 0.4 06/22/2019 1004   GFRNONAA >60 12/19/2019 0516   GFRNONAA 104 12/13/2019 1053   GFRAA >60 12/19/2019 0516   GFRAA 121 12/13/2019 1053   Lipase  No results found for: LIPASE  Studies/Results: DG CHEST PORT 1 VIEW  Result Date: 12/18/2019 CLINICAL DATA:  Shortness of breath, history hypertension, HIV EXAM: PORTABLE CHEST 1 VIEW COMPARISON:  Portable exam 1403 hours compared to 12/15/2019 Correlation: CT chest 12/06/2019 FINDINGS: LEFT subclavian sequential transvenous pacemaker leads project at RIGHT atrium and RIGHT ventricle. Normal heart size and pulmonary vascularity. BILATERAL hilar enlargement.  Mild bibasilar atelectasis. Upper lungs clear. No pleural effusion or pneumothorax. Extrapleural density at lateral lower RIGHT chest, by CT corresponding to rib lesions as noted on prior CT. IMPRESSION: Bibasilar atelectasis. BILATERAL hilar and AP window enlargement, corresponding to enlarged pulmonary arteries on prior CT question pulmonary arterial hypertension. Electronically Signed   By: Lavonia Dana M.D.   On: 12/18/2019 14:31   Anti-infectives: Anti-infectives (From admission, onward)   Start     Dose/Rate Route Frequency Ordered Stop   12/04/2019 1815  bictegravir-emtricitabine-tenofovir AF (BIKTARVY) 50-200-25 MG  per tablet 1 tablet     Discontinue     1 tablet Oral Daily 12/18/2019 1811         Assessment/Plan HIV - CD4  217(12/13/19) Hypertension Hx of 2 prior CVA's PTVP - Tachy-brady syndrome? Hx depression Severe calorie malnutrition Hypokalemia Hx tobacco use  Slurred speech, abnormal gait, weight loss - MRI STAT pending to evaluate for CVA , will follow results and neuro recs - widespread lymphadenopathy with degenerative changes of the lumbar spine, right iliac, and pathologic changes/fractures of right ribs  - primary team spoke with cards  regarding pacemaker compatibility with MRI. Will discuss cards workup with Dr. Ninfa Linden. If neuro workup is negative then patient will likely need cardiac clearance/risk stratification for OR.  - possible LN Bx this week pending above neuro/cards workup.   FEN: D3 diet ID:  None DVT: SCD's F/U:  TBD    LOS: 3 days    Obie Dredge, [facility] Please see Amion for pager number during day hours 7:00am-4:30pm

## 2019-12-19 NOTE — Progress Notes (Signed)
Called to dept for IV contrast extrav during CT Tech states approx 21mL to (R)upper arm PIV  Pt denies pain. Small area focal swelling proximal to PIV. NT, no skin changes.  Will implement contrast extrav order set including ice, elevation, and observation.  Ascencion Dike PA-C Interventional Radiology 12/19/2019 2:56 PM

## 2019-12-19 NOTE — Progress Notes (Addendum)
ANTICOAGULATION CONSULT NOTE - Initial Consult  Pharmacy Consult for Lovenox Indication: R/o PE   No Known Allergies  Patient Measurements: Weight: 59.4 kg (130 lb 15.3 oz) Heparin Dosing Weight: 59.4 kg   Vital Signs: Temp: 98.3 F (36.8 C) (07/26 0827) Temp Source: Oral (07/26 0827) BP: 112/73 (07/26 0827) Pulse Rate: 98 (07/26 0827)  Labs: Recent Labs    12/17/19 0536 12/17/19 0536 12/18/19 0349 12/19/19 0516  HGB 9.4*   < > 8.8* 8.5*  HCT 28.9*  --  27.2* 26.1*  PLT 136*  --  101* 70*  CREATININE 0.70  --  0.61 0.74   < > = values in this interval not displayed.    Estimated Creatinine Clearance: 89.7 mL/min (by C-G formula based on SCr of 0.74 mg/dL).   Medical History: Past Medical History:  Diagnosis Date  . Depression   . HIV (human immunodeficiency virus infection) ([place])   . Hypertension   . Pacemaker     Medications:  Medications Prior to Admission  Medication Sig Dispense Refill Last Dose  . acetaminophen (TYLENOL) 500 MG tablet Take 1,000 mg by mouth every 6 (six) hours as needed for moderate pain.   Past Month at Unknown time  . amLODipine (NORVASC) 10 MG tablet TAKE 1 TABLET(10 MG) BY MOUTH DAILY (Patient taking differently: Take 10 mg by mouth daily. ) 30 tablet 3 12/15/2019 at Unknown time  . atorvastatin (LIPITOR) 20 MG tablet Take 1 tablet (20 mg total) by mouth daily. 90 tablet 3 12/15/2019 at Unknown time  . bictegravir-emtricitabine-tenofovir AF (BIKTARVY) 50-200-25 MG TABS tablet Take 1 tablet by mouth daily. 30 tablet 11 12/15/2019 at Unknown time  . folic acid (FOLVITE) 1 MG tablet TAKE 1 TABLET(1 MG) BY MOUTH EVERY EVENING (Patient taking differently: Take 1 mg by mouth every evening. ) 30 tablet 3 12/15/2019 at Unknown time  . megestrol (MEGACE ES) 625 MG/5ML suspension Take 5 mLs (625 mg total) by mouth daily. 150 mL 0 12/15/2019 at Unknown time  . methocarbamol (ROBAXIN) 500 MG tablet Take 1 tablet (500 mg total) by mouth 2 (two) times  daily. 12 tablet 0 12/15/2019 at Unknown time  . metoprolol succinate (TOPROL-XL) 25 MG 24 hr tablet TAKE 1 TABLET(25 MG) BY MOUTH EVERY EVENING (Patient taking differently: Take 25 mg by mouth every evening. ) 30 tablet 3 12/15/2019 at 2000  . risperiDONE (RISPERDAL) 2 MG tablet TAKE 1 TABLET(2 MG) BY MOUTH AT BEDTIME (Patient taking differently: Take 2 mg by mouth at bedtime. ) 30 tablet 3 12/15/2019 at Unknown time  . acetaminophen (TYLENOL) 325 MG tablet Take 2 tablets (650 mg total) by mouth every 6 (six) hours as needed for up to 30 doses for mild pain or moderate pain. (Patient not taking: Reported on 12/03/2019) 30 tablet 0 Not Taking at Unknown time  . aspirin (ASPIRIN LOW DOSE) 81 MG EC tablet TAKE 1 TABLET(81 MG) BY MOUTH EVERY EVENING (Patient not taking: Reported on 12/22/2019) 30 tablet 3 Not Taking at Unknown time  . Cholecalciferol (VITAMIN D3) 25 MCG (1000 UT) CAPS Take 1 capsule (1,000 Units total) by mouth daily. (Patient not taking: Reported on 12/13/2019) 30 capsule 3 Not Taking at Unknown time  . predniSONE (STERAPRED UNI-PAK 21 TAB) 10 MG (21) TBPK tablet Take by mouth daily. Take 6 tabs by mouth daily  for 2 days, then 5 tabs for 2 days, then 4 tabs for 2 days, then 3 tabs for 2 days, 2 tabs for 2 days, then  1 tab by mouth daily for 2 days (Patient not taking: Reported on 12/09/2019) 42 tablet 0 Not Taking at Unknown time  . valACYclovir (VALTREX) 500 MG tablet Take 1 tablet (500 mg total) by mouth every evening. (Patient not taking: Reported on 12/24/2019) 30 tablet 0 Not Taking at Unknown time  . Vitamin D, Ergocalciferol, (DRISDOL) 1.25 MG (50000 UT) CAPS capsule Take 1 capsule (50,000 Units total) by mouth every Wednesday. (Patient not taking: Reported on 12/13/2019) 5 capsule 0 Not Taking at Unknown time    Assessment: 62 YOM with elevated D-dimer concerning for PE to start treatment dose Lovenox. CT-PE ordered. H/H low stable. Plt down to 70k. SCr wnl  Goal of Therapy:  Anti-Xa  level 0.6-1 units/ml 4hrs after LMWH dose given Monitor platelets by anticoagulation protocol: Yes   Plan:  -Start Lovenox 1 mg/kg twice daily (60 mg)  -F/u CT PE results -Monitor daily CBC and s/s of bleeding   Albertina Parr, PharmD., BCPS, BCCCP Clinical Pharmacist Clinical phone for 12/19/19 until 3:30pm: [phone] If after 3:30pm, please refer to [facility] for unit-specific pharmacist

## 2019-12-19 NOTE — Progress Notes (Signed)
   12/19/19 1400  Clinical Encounter Type  Visited With Patient  Visit Type Initial  Referral From Nurse  Consult/Referral To Chaplain  Spiritual Encounters  Spiritual Needs Brochure;Prayer  Stress Factors  Patient Stress Factors Health changes;Lack of knowledge   The chaplain responded to a consult for prayer and AD. The patient expressed that nothing like this had every happened to him. He mentioned that his sisters a praying for him but he is scared about his health. The chaplain was not able to have a conversation concerning AD because the patient did not seem able at the time and transport was taking the patient for another procedure. The chaplain will try to follow-up later.  Brion Aliment Chaplain Resident For questions concerning this note please contact me by pager [phone]

## 2019-12-20 ENCOUNTER — Inpatient Hospital Stay (HOSPITAL_COMMUNITY): Payer: Medicaid Other

## 2019-12-20 DIAGNOSIS — R7989 Other specified abnormal findings of blood chemistry: Secondary | ICD-10-CM

## 2019-12-20 DIAGNOSIS — J9601 Acute respiratory failure with hypoxia: Secondary | ICD-10-CM | POA: Diagnosis present

## 2019-12-20 DIAGNOSIS — C800 Disseminated malignant neoplasm, unspecified: Secondary | ICD-10-CM

## 2019-12-20 LAB — CBC WITH DIFFERENTIAL/PLATELET
Abs Immature Granulocytes: 0.86 10*3/uL — ABNORMAL HIGH (ref 0.00–0.07)
Basophils Absolute: 0 10*3/uL (ref 0.0–0.1)
Basophils Relative: 1 %
Eosinophils Absolute: 0.1 10*3/uL (ref 0.0–0.5)
Eosinophils Relative: 2 %
HCT: 24.4 % — ABNORMAL LOW (ref 39.0–52.0)
Hemoglobin: 7.7 g/dL — ABNORMAL LOW (ref 13.0–17.0)
Immature Granulocytes: 18 %
Lymphocytes Relative: 24 %
Lymphs Abs: 1.1 10*3/uL (ref 0.7–4.0)
MCH: 29.3 pg (ref 26.0–34.0)
MCHC: 31.6 g/dL (ref 30.0–36.0)
MCV: 92.8 fL (ref 80.0–100.0)
Monocytes Absolute: 0.2 10*3/uL (ref 0.1–1.0)
Monocytes Relative: 5 %
Neutro Abs: 2.4 10*3/uL (ref 1.7–7.7)
Neutrophils Relative %: 50 %
Platelets: 58 10*3/uL — ABNORMAL LOW (ref 150–400)
RBC: 2.63 MIL/uL — ABNORMAL LOW (ref 4.22–5.81)
RDW: 14.6 % (ref 11.5–15.5)
WBC: 4.8 10*3/uL (ref 4.0–10.5)
nRBC: 1.9 % — ABNORMAL HIGH (ref 0.0–0.2)

## 2019-12-20 LAB — COMPREHENSIVE METABOLIC PANEL
ALT: 14 U/L (ref 0–44)
AST: 27 U/L (ref 15–41)
Albumin: 1.9 g/dL — ABNORMAL LOW (ref 3.5–5.0)
Alkaline Phosphatase: 158 U/L — ABNORMAL HIGH (ref 38–126)
Anion gap: 14 (ref 5–15)
BUN: 15 mg/dL (ref 6–20)
CO2: 25 mmol/L (ref 22–32)
Calcium: 10.2 mg/dL (ref 8.9–10.3)
Chloride: 96 mmol/L — ABNORMAL LOW (ref 98–111)
Creatinine, Ser: 0.69 mg/dL (ref 0.61–1.24)
GFR calc Af Amer: >60 mL/min (ref >60)
GFR calc non Af Amer: >60 mL/min (ref >60)
Glucose, Bld: 94 mg/dL (ref 70–99)
Potassium: 4.2 mmol/L (ref 3.5–5.1)
Sodium: 135 mmol/L (ref 135–145)
Total Bilirubin: 0.4 mg/dL (ref 0.3–1.2)
Total Protein: 5.6 g/dL — ABNORMAL LOW (ref 6.5–8.1)

## 2019-12-20 LAB — GLUCOSE, CAPILLARY
Glucose-Capillary: 101 mg/dL — ABNORMAL HIGH (ref 70–99)
Glucose-Capillary: 102 mg/dL — ABNORMAL HIGH (ref 70–99)
Glucose-Capillary: 102 mg/dL — ABNORMAL HIGH (ref 70–99)
Glucose-Capillary: 106 mg/dL — ABNORMAL HIGH (ref 70–99)
Glucose-Capillary: 108 mg/dL — ABNORMAL HIGH (ref 70–99)
Glucose-Capillary: 112 mg/dL — ABNORMAL HIGH (ref 70–99)
Glucose-Capillary: 128 mg/dL — ABNORMAL HIGH (ref 70–99)

## 2019-12-20 LAB — MAGNESIUM: Magnesium: 1.9 mg/dL (ref 1.7–2.4)

## 2019-12-20 LAB — D-DIMER, QUANTITATIVE: D-Dimer, Quant: 7.94 ug/mL-FEU — ABNORMAL HIGH (ref 0.00–0.50)

## 2019-12-20 LAB — PHOSPHORUS: Phosphorus: 3.9 mg/dL (ref 2.5–4.6)

## 2019-12-20 MED ORDER — ENOXAPARIN SODIUM 40 MG/0.4ML ~~LOC~~ SOLN
40.0000 mg | SUBCUTANEOUS | Status: DC
  Administered 2019-12-22 – 2019-12-24 (×3): 40 mg via SUBCUTANEOUS
  Filled 2019-12-20 (×4): qty 0.4

## 2019-12-20 MED ORDER — METOPROLOL SUCCINATE ER 25 MG PO TB24
37.5000 mg | ORAL_TABLET | Freq: Every evening | ORAL | Status: DC
  Administered 2019-12-20 – 2019-12-25 (×6): 37.5 mg via ORAL
  Filled 2019-12-20 (×6): qty 2

## 2019-12-20 NOTE — Progress Notes (Signed)
  Speech Language Pathology Treatment: Dysphagia  Patient Details Name: Mike Holland MRN: [id number] DOB: 1965/11/19 Today's Date: 12/20/2019 Time: 2035-5974 SLP Time Calculation (min) (ACUTE ONLY): 13 min  Assessment / Plan / Recommendation Clinical Impression  SLP provided skilled observation during lunch meal. Although he had no overt s/s of aspiration, he does demonstrate reduced oral control. While brushing his teeth, he allows saliva/toothpaste to spill out of his mouth onto his chest. During PO intake he does not have as much anterior spillage, but he does have prolonged mastication and incomplete oral clearance requiring Min cues from SLP to clear his mouth in between bites. He subjectively describes difficulty with "tougher" textures, such as meats, that feel like they get stuck in his throat. He denies any h/o esophageal issues or GER. SLP provided education on current diet vs chopped diet, and he would like to try a downgraded Dys 2 diet with thin liquids. SLP will continue to follow up to assess for tolerance vs need for any further modifications or testing. Note that while pt's speech is mostly intelligible, he does exhibit mild dysarthria c/b slow rate and could benefit from SLP eval for speech/language acutely or at next level of care.   HPI HPI: DONTAE Holland is a 54 y.o. male with medical history significant of HIV-diagnosed in 1996, R CVA, hypertension, depression, hyperlipidemia, ? tachybradycardia syndrome.  Patient presented to the ED with slurred speech and shuffling gait for 3 days.  Head CT was negative for acute abnormality, MRI pending.  CXR reported: "Unusual findings in the right hemithorax concerning for potential malignancy."        SLP Plan  Continue with current plan of care       Recommendations  Diet recommendations: Dysphagia 2 (fine chop);Thin liquid Liquids provided via: Cup;Straw Medication Administration: Whole meds with liquid Supervision: Patient  able to self feed;Intermittent supervision to cue for compensatory strategies Compensations: Slow rate;Small sips/bites Postural Changes and/or Swallow Maneuvers: Seated upright 90 degrees                Oral Care Recommendations: Oral care BID Follow up Recommendations: Skilled Nursing facility SLP Visit Diagnosis: Dysphagia, oral phase (R13.11) Plan: Continue with current plan of care       GO                Osie Bond., M.A. [facility] Pager [phone] Office [phone]  12/20/2019, 3:01 PM

## 2019-12-20 NOTE — Progress Notes (Signed)
MRI completed and images personally reviewed. 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 Neuro exam revealed LLE weakness on a background of generalized weakness.  -- MRI reveals normal appearance of the brain, with no acute stroke seen. 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 The patient's HIV+ status puts him at high risk for the development of lymphoma -- Oncology is following -- Surgery is following. Per their note, they requested that the patient be cleared by Neurology and Cardiology. Cleared for lymph node biopsy from a Neurological standpoint. Benefits of diagnostic information from biopsy far outweigh the neurological risks (nerve damage, stroke, anesthesia complications) of the procedure. -- LLE weakness could be secondary to lymphomatous involvement of the cauda equina. An MRI of the lumbar spine with and without contrast is recommended to further evaluate (ordered).   Electronically signed: Dr. Kerney Elbe

## 2019-12-20 NOTE — Progress Notes (Signed)
PROGRESS NOTE    MEDHANSH Holland  UYQ:[id number] DOB: Jun 17, 1965 DOA: 12/12/2019 PCP: Gildardo Pounds, NP     Brief Narrative:  Mike Holland is a 54 y.o. BM PMHx HIV-diagnosed in 1996, CVA, HTN, HLD, Depression, Tachybradycardia syndrome?    S/p Medtronic Pacemaker placement in July 2018   Presents to emergency department with slurred speech and shuffling gait since 3 days.   Patient tells me that he has history of CVA.  He started having slurred speech and shuffling gait/difficulty in walking since 3 days.  He tells me that today his symptoms are getting better.  He denies headache, blurry vision, facial drop, seizures, loss of consciousness, lightheadedness, dizziness, numbness weakness tingling sensation in hands or feet, fall or head trauma.  Reports unintentional weight loss of 20 pounds in 1 month, generalized weakness, lethargic, decreased appetite.  He is followed by infectious disease outpatient for history of HIV.  He was seen by Dr. Belenda Cruise on 7/20-Genvoya was discontinued due to weight loss and was started on Biktarvy and megestrol.  He takes Valtrex for prophylaxis.  He denies fever, chills, nausea, vomiting, diarrhea, abdominal pain, chest pain, shortness of breath, palpitation, leg swelling, urinary or sleep changes.  He tells me that he has chronic constipation and has bowel movement every 4 to 5 days.  He lives alone at home.  Moved from [place] on November 2020.  Denies cigarette smoking, alcohol however uses marijuana 3 times daily to help with appetite.  He tells me pacemaker was placed in July 2018 due to irregular heart rate.  ED Course: Upon arrival to ED: Patient slightly tachycardic with heart rate in 105, afebrile, maintaining oxygen saturation on room air, WBC: 12.1, H&H: 9.9/31.1, ethanol: WNL, CMP shows potassium of 2.7, calcium: 10.6, UDS positive for marijuana, UA negative for infection.  Magnesium: 1.3.  CT head came back negative for acute findings.   MRI was not obtained due to pacemaker.  Neurology consulted.  Chest x-ray, CT abdomen/CT chest was obtained which shows findings consistent with widespread neoplastic involvement-concerning for lymphoma.  EDP consulted oncology.  [facility] hospital[facility] consulted for admission.  Apparently: Patient's pacemaker is conditional for MRI and will need one of the trained MRI technician to change pacemaker settings into MRI safe mode however there is no staff available today that are trained to do this.    Subjective: 7/27 A/O x4, negative CP, positive S OB, positive DOE, negative abdominal pain.  Extremely weak.  Assessment & Plan: Covid vaccination; no vaccination   Principal Problem:   Widespread metastatic malignant neoplastic disease ([place]) Active Problems:   HIV disease ([place])   Essential hypertension   Depression   Unintentional weight loss   HLD (hyperlipidemia)   Pacemaker   Hypokalemia   Hypomagnesemia   Normocytic anemia   Marijuana abuse   Slurred speech   Palliative care by specialist   Goals of care, counseling/discussion   DNR (do not resuscitate)   Acute respiratory failure with hypoxia ([place])  Slurred speech/shuffling gait -Negative slurred speech -Did not attempt to ambulate patient -Hx CVA in the past, CT head negative for acute findings.  Unable to obtain MRI due to pacer.  -Per EMR neurology consulted by EDP.  No further stroke work-up warranted at this time given patient's findings on abdominal/chest CT for metastatic neoplasm. -After discussing case with cardiology hold off on MRI until Dr. Caryl Comes EP has a chance to evaluate patient.  We will then reorder MRI if he deems safe. -  7/25 discussed case with Dr. Virl Axe cardiology EP and he states that patient is safe to go ahead with his head MRI, radiology has a whole protocol already established for people with Medtronic pacers who require MRIs.  Prediabetes -7/23 hemoglobin A1c= 6.1, patient meets guidelines for  prediabetes -Sensitive SSI for now -Prior to discharge will start him on Metformin -7/25 consult to diabetic coordinator new diagnosis of prediabetes -7/25 consult to nutrition diagnosis of prediabetes  HLD -Atorvastatin 20 mg daily -Lipid panel pending   Acute respiratory failure with hypoxia -Multifactorial metastatic neoplasm, HIV, severe protein calorie malnutrition -Titrate O2 to maintain SPO2> 88%  Widespread neoplasm -CT abdomen/pelvis concerning for lymphoma.  Discussed case with Dr. Narda Rutherford [facility] Oncology who is agreed to evaluate patient today. -Per oncology recommendations have ordered LDH, Fflow Cytometry -Have requested [facility] perform excisional biopsy of RIGHT axillary lymph node per oncology request.  PA Will from [facility] has agreed to see patient today -7/28 patient scheduled for biopsy axillary lymph node  RIGHT seventh rib pathologic fracture -Likely secondary to malignancy/lymphoma -Currently negative pain  HIV -Dr. Belenda Cruise from ID follows patient.  Last seen 7/20  - Biktarvy 50-2 100-25 mg 1 tablet daily -Valtrex for prophylaxis (not taking per patient)  Tachybradycardia syndrome -S/p Medtronic Pacemaker placement  2018 July  -7/25 patient sinus tachycardia already received medication see HTN. -7/25 Amiodarone drip  Essential HTN/Hypotensive -Patient's BP on the low side, appears secondary to dehydration and malnutrition -Norvasc 10 mg daily -7/27 increase Toprol 37.5 mg daily  -Albumin 50 g -D5-0.9% saline 40ml/hr  Normocytic anemia: -H&H dropped from 11.2/33.3-9.9/31.3 in 1 month. -Occult blood pending -Anemia panel consistent with normocytic anemia.  Patient has extremely high ferritin since ferritin is a inflammatory marker would be consistent with malignancy  Hypokalemia -Potassium goal > 4 -Stable  Hypomagnesmia -Magnesium goal > 2 -Stable  Marijuana abuse -Patient tells me that he uses marijuana 3 times daily to help with his  appetite.  Oral candidiasis -Likely secondary to HIV -Nystatin QID  Moderate to severe protein calorie malnutrition -: BMI of 19 -History of unintentional weight loss.   -Consult nutritionist -7/26 Marinol 5 mg BID  Depression/Bipolar:  -Patient not on medication for depression or bipolar. -Continue patient's antipsychotic medication risperidone 2 mg daily  Pulmonary embolus -Trend D-dimer Recent Labs  Lab 12/18/19 1811 12/20/19 0442  DDIMER 9.71* 7.94*  -7/25 CT chest PE protocol negative PE -7/25 treatment dose Lovenox -7/27 bilateral lower extremity Doppler pending   Goals of care -7/24 palliative care consult; patient with HIV, metastatic neoplasm change of CODE STATUS, short and long-term goals of care   DVT prophylaxis: Lovenox treatment dose Code Status: DNR Family Communication:  Status is: Inpatient    Dispo: The patient is from: Home              Anticipated d/c is to: Home              Anticipated d/c date is: 7/30              Patient currently unstable      Consultants:  Medical oncology Dr. Narda Rutherford [facility] PA Will Palliative care Cardiology EP Dr. Virl Axe     Procedures/Significant Events:  CT chest W contrast; Cardiovascular:-pulmonary artery is enlarged without definitive central filling defect. Mediastinum/Nodes:-Scattered small hilar and mediastinal lymph nodes are noted.  -Bilateral axillary adenopathy is identified the largest of these on the right measures 12 mm. The largest of these on the  left measures 11 mm. Lungs/Pleura: Emphysematous changes are seen. more marked on the right.  -Enhancing pleural base lesions are seen throughout the right chest adjacent to the anterior aspect of the right fourth rib as well as the posterolateral aspect of the right seventh rib and posterior aspect of the right ninth rib. Given the multiplicity of pleural based lesions, metastatic disease deserves consideration. -Pathologic fracture is  noted involving theposterolateral aspect of the right seventh rib secondary to the pleural based mass.  -Considerable retroperitoneal adenopathy is noted in the Peri aortic and intra-aortocaval region. The largest area is noted adjacent to aortic bifurcation measuring approximately 4.3 x 1.9 cm in greatest dimension.  -Bilateral common iliac adenopathy is seen as well.  -Moth-eaten pattern identified within the right iliac bone adjacent to the sacroiliac joint as well as an area of bony destruction in the left half of the sacrum -Soft tissue nodule is noted in the right buttock which measures 17 mm ingreatest dimension. This may be related to the underlying diffuse process although may be incidental. -Constellation of findings consistent with widespread neoplastic involvement. Given the lymphadenopathy in the possibility of lymphoma is felt to be most likely. Node biopsy would be helpful. Additionally PET-CT imaging is recommended for further Evaluation. 7/27 CTA chest PE protocol; negative PE  -Small bilateral pleural effusions. -Thoracic adenopathy and right sided pleural/chest wall lesions as detailed above. 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 -Emphysema   I have personally reviewed and interpreted all radiology studies and my findings are as above.  VENTILATOR SETTINGS:    Cultures   Antimicrobials:    Devices Permanent pacemaker 2018>>>>   LINES / TUBES:      Continuous Infusions: . amiodarone 30 mg/hr (12/20/19 0536)  . dextrose 5 % and 0.9% NaCl 50 mL/hr at 12/19/19 2337     Objective: Vitals:   12/20/19 0437 12/20/19 0500 12/20/19 0821 12/20/19 1011  BP: 125/69  125/70   Pulse: 101  102   Resp:      Temp: 98.4 F (36.9 C)  100.2 F (37.9 C)   TempSrc: Oral  Axillary   SpO2: 94%  95%   Weight:  59.4 kg  59.4 kg  Height:    5\' 7"  (1.702 m)    Intake/Output Summary (Last 24 hours) at 12/20/2019 1051 Last data filed at 12/20/2019  0400 Gross per 24 hour  Intake 980 ml  Output 1300 ml  Net -320 ml   Filed Weights   12/19/19 0500 12/20/19 0500 12/20/19 1011  Weight: 59.4 kg 59.4 kg 59.4 kg   Physical Exam:  General: A/O x4, positive acute respiratory distress, positive DOE, cachectic Eyes: negative scleral hemorrhage, negative anisocoria, negative icterus ENT: Negative Runny nose, negative gingival bleeding, Neck:  Negative scars, masses, torticollis, lymphadenopathy, JVD Lungs: tachypneic clear to auscultation bilaterally without wheezes or crackles Cardiovascular: Tachycardic without murmur gallop or rub normal S1 and S2 Abdomen: negative abdominal pain, nondistended, positive soft, bowel sounds, no rebound, no ascites, no appreciable mass Extremities: No significant cyanosis, clubbing, or edema bilateral lower extremities Skin: Negative rashes, lesions, ulcers Psychiatric:  Negative depression, negative anxiety, negative fatigue, negative mania  Central nervous system:  Cranial nerves II through XII intact, tongue/uvula midline, all extremities muscle strength 5/5, sensation intact throughout, negative dysarthria, negative expressive aphasia, negative receptive aphasia. . .     Data Reviewed: Care during the described time interval was provided by me .  I have reviewed this patient's available data, including medical  history, events of note, physical examination, and all test results as part of my evaluation.  CBC: Recent Labs  Lab 12/04/2019 1135 12/03/2019 1135 12/14/2019 1637 12/17/19 0536 12/18/19 0349 12/19/19 0516 12/20/19 0442  WBC 12.1*   < > 10.4 9.0 6.9 5.6 4.8  NEUTROABS 7.4  --   --   --  4.3 3.0 2.4  HGB 9.9*   < > 9.2* 9.4* 8.8* 8.5* 7.7*  HCT 31.3*   < > 28.9* 28.9* 27.2* 26.1* 24.4*  MCV 92.3   < > 92.0 92.0 92.2 92.2 92.8  PLT 156   < > 140* 136* 101* 70* 58*   < > = values in this interval not displayed.   Basic Metabolic Panel: Recent Labs  Lab 11/25/2019 1135 11/28/2019 1135  12/02/2019 1300 12/01/2019 1637 12/17/19 0536 12/18/19 0349 12/19/19 0516 12/20/19 0442  NA 134*  --   --   --  137 136 136 135  K 2.7*  --   --   --  3.0* 4.3 4.2 4.2  CL 93*  --   --   --  95* 98 96* 96*  CO2 24  --   --   --  26 25 24 25   GLUCOSE 113*  --   --   --  68* 128* 101* 94  BUN 7  --   --   --  6 7 9 15   CREATININE 0.71   < >  --  0.58* 0.70 0.61 0.74 0.69  CALCIUM 10.6*  --   --   --  10.0 10.2 10.3 10.2  MG  --   --  1.3*  --  1.6* 1.7 1.5* 1.9  PHOS  --   --   --  3.2  --  2.2* 3.5 3.9   < > = values in this interval not displayed.   GFR: Estimated Creatinine Clearance: 89.7 mL/min (by C-G formula based on SCr of 0.69 mg/dL). Liver Function Tests: Recent Labs  Lab 12/12/2019 1135 12/17/19 0536 12/18/19 0349 12/19/19 0516 12/20/19 0442  AST 29 26 34 39 27  ALT 13 12 13 16 14   ALKPHOS 183* 159* 156* 169* 158*  BILITOT 0.9 1.5* 0.8 0.8 0.4  PROT 6.8 6.2* 6.1* 6.0* 5.6*  ALBUMIN 2.2* 2.0* 2.5* 2.1* 1.9*   No results for input(s): LIPASE, AMYLASE in the last 168 hours. No results for input(s): AMMONIA in the last 168 hours. Coagulation Profile: Recent Labs  Lab 12/20/2019 1135  INR 1.4*   Cardiac Enzymes: No results for input(s): CKTOTAL, CKMB, CKMBINDEX, TROPONINI in the last 168 hours. BNP (last 3 results) No results for input(s): PROBNP in the last 8760 hours. HbA1C: No results for input(s): HGBA1C in the last 72 hours. CBG: Recent Labs  Lab 12/19/19 1703 12/19/19 2021 12/19/19 2342 12/20/19 0500 12/20/19 0819  GLUCAP 105* 146* 220* 102* 108*   Lipid Profile: Recent Labs    12/18/19 0349  CHOL 99  HDL 17*  LDLCALC 52  TRIG 151*  CHOLHDL 5.8   Thyroid Function Tests: No results for input(s): TSH, T4TOTAL, FREET4, T3FREE, THYROIDAB in the last 72 hours. Anemia Panel: Recent Labs    12/18/19 0349  VITAMINB12 449  FOLATE 10.2  FERRITIN >7,500*  TIBC 141*  IRON 97  RETICCTPCT 0.7   Sepsis Labs: No results for input(s): PROCALCITON,  LATICACIDVEN in the last 168 hours.  Recent Results (from the past 240 hour(s))  SARS Coronavirus 2 by RT PCR (hospital order, performed in  [facility] hospital lab) Nasopharyngeal Nasopharyngeal Swab     Status: None   Collection Time: 12/20/2019  3:08 PM   Specimen: Nasopharyngeal Swab  Result Value Ref Range Status   SARS Coronavirus 2 NEGATIVE NEGATIVE Final    Comment: (NOTE) SARS-CoV-2 target nucleic acids are NOT DETECTED.  The SARS-CoV-2 RNA is generally detectable in upper and lower respiratory specimens during the acute phase of infection. The lowest concentration of SARS-CoV-2 viral copies this assay can detect is 250 copies / mL. A negative result does not preclude SARS-CoV-2 infection and should not be used as the sole basis for treatment or other patient management decisions.  A negative result may occur with improper specimen collection / handling, submission of specimen other than nasopharyngeal swab, presence of viral mutation(s) within the areas targeted by this assay, and inadequate number of viral copies (<250 copies / mL). A negative result must be combined with clinical observations, patient history, and epidemiological information.  Fact Sheet for Patients:   StrictlyIdeas.no  Fact Sheet for Healthcare Providers: BankingDealers.co.za  This test is not yet approved or  cleared by the [place] FDA and has been authorized for detection and/or diagnosis of SARS-CoV-2 by FDA under an Emergency Use Authorization (EUA).  This EUA will remain in effect (meaning this test can be used) for the duration of the COVID-19 declaration under Section 564(b)(1) of the Act, 21 U.S.C. section 360bbb-3(b)(1), unless the authorization is terminated or revoked sooner.  Performed at [facility], [place] [address], [place], [place] [address]   MRSA PCR Screening     Status: None   Collection Time: 12/18/19 10:25 PM    Specimen: Nasal Mucosa; Nasopharyngeal  Result Value Ref Range Status   MRSA by PCR NEGATIVE NEGATIVE Final    Comment:        The GeneXpert MRSA Assay (FDA approved for NASAL specimens only), is one component of a comprehensive MRSA colonization surveillance program. It is not intended to diagnose MRSA infection nor to guide or monitor treatment for MRSA infections. Performed at [facility], [place] [address], [place], [place] [address]          Radiology Studies: CT ANGIO CHEST PE W OR WO CONTRAST  Result Date: 12/19/2019 CLINICAL DATA:  Lymphoma.  Elevated D-dimer.  Dyspnea on exertion. EXAM: CT ANGIOGRAPHY CHEST WITH CONTRAST TECHNIQUE: Multidetector CT imaging of the chest was performed using the standard protocol during bolus administration of intravenous contrast. Multiplanar CT image reconstructions and MIPs were obtained to evaluate the vascular anatomy. CONTRAST:  48mL OMNIPAQUE IOHEXOL 350 MG/ML SOLN COMPARISON:  12/18/2019 chest radiograph. CT of the chest of 12/15/2019. FINDINGS: Cardiovascular: The quality of this exam for evaluation of pulmonary embolism is moderate. Although the bolus is well timed, limitations include mild motion and patient left arm position, not elevated. No pulmonary embolism to the large segmental level. Aortic atherosclerosis. Normal heart size. Dual lead pacer. Pulmonary artery enlargement, suggesting pulmonary arterial hypertension. Lad coronary artery calcification. Mediastinum/Nodes: Small low jugular nodes bilaterally. 1.0 cm right subpectoral node. Bilateral axillary nodes, as on recent diagnostic CT. Example 1.4 cm left axillary node. High right paratracheal 9 mm node on 25/10. Hilar adenopathy is mild at 1.2 cm. Lungs/Pleura: Development of small bilateral pleural effusions since the prior CT. Minimal motion degradation. Right pleural based lesions, including adjacent the anterior fourth and posterior seventh ribs again identified. Upper  Abdomen: Normal imaged portions of the liver, spleen, stomach, pancreas, adrenal glands, kidneys. Musculoskeletal: Right  chest wall mass, surrounding the fourth anterior right rib, including at on the order of 5.6 x 2.5 cm. Anterior left second rib adjacent soft tissue/pleural thickening is more equivocal on 38/10. Seventh posterior right rib irregularity with underlying lytic lesion including on 79/10. Mild T5 superior endplate compression deformity is unchanged. Review of the MIP images confirms the above findings. IMPRESSION: 1. No evidence of pulmonary embolism with limitations above. Pulmonary artery enlargement suggests pulmonary arterial hypertension. 2. Since the prior CT of 12/18/2019, development of small bilateral pleural effusions. 3. Thoracic adenopathy and right sided pleural/chest wall lesions as detailed above. 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 4. Aortic Atherosclerosis (ICD10-I70.0) and Emphysema (ICD10-J43.9). Electronically Signed   By: Abigail Miyamoto M.D.   On: 12/19/2019 17:09   MR BRAIN W WO CONTRAST  Result Date: 12/19/2019 CLINICAL DATA:  Metastatic disease staging EXAM: MRI HEAD WITHOUT AND WITH CONTRAST TECHNIQUE: Multiplanar, multiecho pulse sequences of the brain and surrounding structures were obtained without and with intravenous contrast. CONTRAST:  39mL GADAVIST GADOBUTROL 1 MMOL/ML IV SOLN COMPARISON:  None. FINDINGS: There is significant susceptibility artifact from BB within the ethmoid sinus. There is variable obscuration of brain parenchyma on different sequences. Susceptibility weighted imaging is nondiagnostic. Below findings are within this limitation. Brain: There is no abnormal diffusion signal. Prominence of the ventricles and sulci reflects generalized parenchymal volume loss. There is a chronic small vessel infarct of the right basal ganglia and adjacent white matter. There is no intracranial mass, mass effect, or edema. There is no  hydrocephalus or extra-axial fluid collection. No abnormal enhancement. Vascular: Major vessel flow voids at the skull base are preserved. Skull and upper cervical spine: Decreased T1 marrow signal. Sinuses/Orbits: Paranasal sinuses are aerated. Orbits are unremarkable. Other: Sella is unremarkable. Left mastoid effusion. More patchy mild right mastoid fluid opacification. There is abnormal soft tissue thickening at the posterior nasopharyngeal wall eccentric to the left responsible for asymmetric mastoid opacification. IMPRESSION: Suboptimal evaluation due to significant artifact from ethmoid sinus BB. Abnormal soft tissue thickening of the posterior nasopharyngeal wall eccentric to the left with associated left mastoid effusion. Given findings on prior imaging, may reflect lymphomatous involvement. Abnormal marrow signal, which could reflect hematopoietic marrow or lymphomatous involvement. Electronically Signed   By: Macy Mis M.D.   On: 12/19/2019 13:17   DG CHEST PORT 1 VIEW  Result Date: 12/18/2019 CLINICAL DATA:  Shortness of breath, history hypertension, HIV EXAM: PORTABLE CHEST 1 VIEW COMPARISON:  Portable exam 1403 hours compared to 11/30/2019 Correlation: CT chest 12/06/2019 FINDINGS: LEFT subclavian sequential transvenous pacemaker leads project at RIGHT atrium and RIGHT ventricle. Normal heart size and pulmonary vascularity. BILATERAL hilar enlargement.  Mild bibasilar atelectasis. Upper lungs clear. No pleural effusion or pneumothorax. Extrapleural density at lateral lower RIGHT chest, by CT corresponding to rib lesions as noted on prior CT. IMPRESSION: Bibasilar atelectasis. BILATERAL hilar and AP window enlargement, corresponding to enlarged pulmonary arteries on prior CT question pulmonary arterial hypertension. Electronically Signed   By: Lavonia Dana M.D.   On: 12/18/2019 14:31        Scheduled Meds: . (feeding supplement) PROSource Plus  30 mL Oral BID BM  . amLODipine  10 mg  Oral Daily  . atorvastatin  20 mg Oral Daily  . bictegravir-emtricitabine-tenofovir AF  1 tablet Oral Daily  . dronabinol  5 mg Oral BID AC  . enoxaparin (LOVENOX) injection  1 mg/kg Subcutaneous BID  . feeding supplement (ENSURE ENLIVE)  237 mL Oral  TID BM  . folic acid  1 mg Oral QPM  . insulin aspart  0-9 Units Subcutaneous Q4H  . metoprolol succinate  37.5 mg Oral QPM  . nystatin  5 mL Oral QID  . risperiDONE  2 mg Oral QHS  . sodium chloride flush  10-40 mL Intracatheter Q12H   Continuous Infusions: . amiodarone 30 mg/hr (12/20/19 0536)  . dextrose 5 % and 0.9% NaCl 50 mL/hr at 12/19/19 2337     LOS: 4 days    Time spent:40 min    Timoty Bourke, Geraldo Docker, MD [facility] Pager [phone]  If 7PM-7AM, please contact night-coverage [email or web address] Password [email or web address] 12/20/2019, 10:51 AM

## 2019-12-20 NOTE — Progress Notes (Addendum)
Nutrition Follow-up  DOCUMENTATION CODES:   Severe malnutrition in context of chronic illness  INTERVENTION:  Provide Ensure Enlive po TID, each supplement provides 350 kcal and 20 grams of protein.  Provide 30 ml Prosource plus po BID, each supplement provides 100 kcal and 15 grams of protein.   Encourage adequate PO intake.   NUTRITION DIAGNOSIS:   Severe Malnutrition related to chronic illness (HIV) as evidenced by percent weight loss, severe muscle depletion; ongoing  GOAL:   Patient will meet greater than or equal to 90% of their needs; progressing  MONITOR:   PO intake, Supplement acceptance, Weight trends, Labs, I & O's  REASON FOR ASSESSMENT:   Consult Assessment of nutrition requirement/status  ASSESSMENT:   Pt with widespread metastatic malignant neoplastic disease presented with slurred speech and shuffling gait x3 days and generalized weakness, poor appetite, and wt loss x1 month. 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 Per Surgery, pt with widespread lymphadenopathy, degenerative changes in lumbar spine and right iliac, Right seventh rib pathologic fracture, Pathologic changes right4th,7th and 9th ribs, Bilateral axillary lymphadenopathy.  Pt currently on a dysphagia 2 diet with thin liquids. Meal completion has been varied from 0-75%. Breakfast this AM 50%. Per weight records, pt with a 9.7% weight loss over the past 1 month, which is significant for time frame. Pt reports mostly consuming soups and puddings at home at meals. Pt does report consuming at least 6 Ensure shake bottles a day at home. Pt currently has Ensure and Prosource ordered and has been consuming them. RD to continue with current orders. Noted RD consulted for pre-DM diet education. Handout "Carbohydrate Counting" from the Academy of [employer] and Dietetics Manual placed in pt discharge instructions.     NUTRITION - FOCUSED PHYSICAL EXAM:    Most Recent Value  Orbital Region Unable to assess  Upper Arm Region Moderate depletion  Thoracic and Lumbar Region Moderate depletion  Buccal Region Unable to assess  Temple Region Unable to assess  Clavicle Bone Region Severe depletion  Clavicle and Acromion Bone Region Severe depletion  Scapular Bone Region Unable to assess  Dorsal Hand Unable to assess  Patellar Region Moderate depletion  Anterior Thigh Region Moderate depletion  Posterior Calf Region Moderate depletion  Edema (RD Assessment) None  Hair Reviewed  Eyes Reviewed  Mouth Reviewed  Skin Reviewed  Nails Reviewed     Labs and medications reviewed.   Diet Order:   Diet Order            DIET DYS 2 Room service appropriate? Yes; Fluid consistency: Thin  Diet effective now                 EDUCATION NEEDS:   No education needs have been identified at this time  Skin:  Skin Assessment: Reviewed RN Assessment  Last BM:  7/19  Height:   Ht Readings from Last 1 Encounters:  12/20/19 5\' 7"  (1.702 m)    Weight:   Wt Readings from Last 1 Encounters:  12/20/19 59.4 kg   BMI:  Body mass index is 20.51 kg/m.  Estimated Nutritional Needs:   Kcal:  2000-2200  Protein:  100-120 grams  Fluid:  >/= 2L/d  Corrin Parker, MS, RD, LDN RD pager number/after hours weekend pager number on Amion.

## 2019-12-20 NOTE — Progress Notes (Addendum)
[facility] Progress Note     Subjective: CC:  NAEO. Cc weakness and fatigue.   Objective: Vital signs in last 24 hours: Temp:  [98.2 F (36.8 C)-100.2 F (37.9 C)] 100.2 F (37.9 C) (07/27 0821) Pulse Rate:  [99-107] 102 (07/27 0821) Resp:  [20] 20 (07/26 1706) BP: (113-135)/(69-79) 125/70 (07/27 0821) SpO2:  [90 %-95 %] 95 % (07/27 0821) Weight:  [59.4 kg] 59.4 kg (07/27 0500) Last BM Date: 12/12/19  Intake/Output from previous day: 07/26 0701 - 07/27 0700 In: 1100 [P.O.:490; I.V.:610] Out: 1300 [Urine:1300] Intake/Output this shift: No intake/output data recorded.  PE: Gen:  Alert, NAD, chronically ill appearing  Card:  Regular rate and rhythm, pedal pulses 2+ BL Pulm:  Normal effort, clear to auscultation bilaterally Abd: Soft, non-tender, non-distended Skin: warm and dry, no rashes; palpable R posterior cervical lymph node ~1-2cm in size. No palpable L axillary nodes, possible small R axillary node palpated on exam.  Psych: A&Ox3   Lab Results:  Recent Labs    12/19/19 0516 12/20/19 0442  WBC 5.6 4.8  HGB 8.5* 7.7*  HCT 26.1* 24.4*  PLT 70* 58*   BMET Recent Labs    12/19/19 0516 12/20/19 0442  NA 136 135  K 4.2 4.2  CL 96* 96*  CO2 24 25  GLUCOSE 101* 94  BUN 9 15  CREATININE 0.74 0.69  CALCIUM 10.3 10.2   PT/INR No results for input(s): LABPROT, INR in the last 72 hours. CMP     Component Value Date/Time   NA 135 12/20/2019 0442   NA 139 11/15/2019 1004   K 4.2 12/20/2019 0442   CL 96 (L) 12/20/2019 0442   CO2 25 12/20/2019 0442   GLUCOSE 94 12/20/2019 0442   BUN 15 12/20/2019 0442   BUN 13 11/15/2019 1004   CREATININE 0.69 12/20/2019 0442   CREATININE 0.76 12/13/2019 1053   CALCIUM 10.2 12/20/2019 0442   PROT 5.6 (L) 12/20/2019 0442   PROT 7.4 11/15/2019 1004   ALBUMIN 1.9 (L) 12/20/2019 0442   ALBUMIN 3.4 (L) 11/15/2019 1004   AST 27 12/20/2019 0442   AST 22 06/22/2019 1004   ALT 14 12/20/2019 0442   ALT 13  06/22/2019 1004   ALKPHOS 158 (H) 12/20/2019 0442   BILITOT 0.4 12/20/2019 0442   BILITOT 0.3 11/15/2019 1004   BILITOT 0.4 06/22/2019 1004   GFRNONAA >60 12/20/2019 0442   GFRNONAA 104 12/13/2019 1053   GFRAA >60 12/20/2019 0442   GFRAA 121 12/13/2019 1053   Lipase  No results found for: LIPASE  Studies/Results: CT ANGIO CHEST PE W OR WO CONTRAST  Result Date: 12/19/2019 CLINICAL DATA:  Lymphoma.  Elevated D-dimer.  Dyspnea on exertion. EXAM: CT ANGIOGRAPHY CHEST WITH CONTRAST TECHNIQUE: Multidetector CT imaging of the chest was performed using the standard protocol during bolus administration of intravenous contrast. Multiplanar CT image reconstructions and MIPs were obtained to evaluate the vascular anatomy. CONTRAST:  20mL OMNIPAQUE IOHEXOL 350 MG/ML SOLN COMPARISON:  12/18/2019 chest radiograph. CT of the chest of 12/06/2019. FINDINGS: Cardiovascular: The quality of this exam for evaluation of pulmonary embolism is moderate. Although the bolus is well timed, limitations include mild motion and patient left arm position, not elevated. No pulmonary embolism to the large segmental level. Aortic atherosclerosis. Normal heart size. Dual lead pacer. Pulmonary artery enlargement, suggesting pulmonary arterial hypertension. Lad coronary artery calcification. Mediastinum/Nodes: Small low jugular nodes bilaterally. 1.0 cm right subpectoral node. Bilateral axillary nodes, as on recent diagnostic CT. Example  1.4 cm left axillary node. High right paratracheal 9 mm node on 25/10. Hilar adenopathy is mild at 1.2 cm. Lungs/Pleura: Development of small bilateral pleural effusions since the prior CT. Minimal motion degradation. Right pleural based lesions, including adjacent the anterior fourth and posterior seventh ribs again identified. Upper Abdomen: Normal imaged portions of the liver, spleen, stomach, pancreas, adrenal glands, kidneys. Musculoskeletal: Right chest wall mass, surrounding the fourth anterior  right rib, including at on the order of 5.6 x 2.5 cm. Anterior left second rib adjacent soft tissue/pleural thickening is more equivocal on 38/10. Seventh posterior right rib irregularity with underlying lytic lesion including on 79/10. Mild T5 superior endplate compression deformity is unchanged. Review of the MIP images confirms the above findings. IMPRESSION: 1. No evidence of pulmonary embolism with limitations above. Pulmonary artery enlargement suggests pulmonary arterial hypertension. 2. Since the prior CT of 12/18/2019, development of small bilateral pleural effusions. 3. Thoracic adenopathy and right sided pleural/chest wall lesions as detailed above. 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 4. Aortic Atherosclerosis (ICD10-I70.0) and Emphysema (ICD10-J43.9). Electronically Signed   By: Abigail Miyamoto M.D.   On: 12/19/2019 17:09   MR BRAIN W WO CONTRAST  Result Date: 12/19/2019 CLINICAL DATA:  Metastatic disease staging EXAM: MRI HEAD WITHOUT AND WITH CONTRAST TECHNIQUE: Multiplanar, multiecho pulse sequences of the brain and surrounding structures were obtained without and with intravenous contrast. CONTRAST:  63mL GADAVIST GADOBUTROL 1 MMOL/ML IV SOLN COMPARISON:  None. FINDINGS: There is significant susceptibility artifact from BB within the ethmoid sinus. There is variable obscuration of brain parenchyma on different sequences. Susceptibility weighted imaging is nondiagnostic. Below findings are within this limitation. Brain: There is no abnormal diffusion signal. Prominence of the ventricles and sulci reflects generalized parenchymal volume loss. There is a chronic small vessel infarct of the right basal ganglia and adjacent white matter. There is no intracranial mass, mass effect, or edema. There is no hydrocephalus or extra-axial fluid collection. No abnormal enhancement. Vascular: Major vessel flow voids at the skull base are preserved. Skull and upper cervical spine: Decreased  T1 marrow signal. Sinuses/Orbits: Paranasal sinuses are aerated. Orbits are unremarkable. Other: Sella is unremarkable. Left mastoid effusion. More patchy mild right mastoid fluid opacification. There is abnormal soft tissue thickening at the posterior nasopharyngeal wall eccentric to the left responsible for asymmetric mastoid opacification. IMPRESSION: Suboptimal evaluation due to significant artifact from ethmoid sinus BB. Abnormal soft tissue thickening of the posterior nasopharyngeal wall eccentric to the left with associated left mastoid effusion. Given findings on prior imaging, may reflect lymphomatous involvement. Abnormal marrow signal, which could reflect hematopoietic marrow or lymphomatous involvement. Electronically Signed   By: Macy Mis M.D.   On: 12/19/2019 13:17   DG CHEST PORT 1 VIEW  Result Date: 12/18/2019 CLINICAL DATA:  Shortness of breath, history hypertension, HIV EXAM: PORTABLE CHEST 1 VIEW COMPARISON:  Portable exam 1403 hours compared to 12/09/2019 Correlation: CT chest 12/15/2019 FINDINGS: LEFT subclavian sequential transvenous pacemaker leads project at RIGHT atrium and RIGHT ventricle. Normal heart size and pulmonary vascularity. BILATERAL hilar enlargement.  Mild bibasilar atelectasis. Upper lungs clear. No pleural effusion or pneumothorax. Extrapleural density at lateral lower RIGHT chest, by CT corresponding to rib lesions as noted on prior CT. IMPRESSION: Bibasilar atelectasis. BILATERAL hilar and AP window enlargement, corresponding to enlarged pulmonary arteries on prior CT question pulmonary arterial hypertension. Electronically Signed   By: Lavonia Dana M.D.   On: 12/18/2019 14:31   Anti-infectives: Anti-infectives (From admission, onward)  Start     Dose/Rate Route Frequency Ordered Stop   11/25/2019 1815  bictegravir-emtricitabine-tenofovir AF (BIKTARVY) 50-200-25 MG per tablet 1 tablet     Discontinue     1 tablet Oral Daily 12/20/2019 1811          Assessment/Plan HIV - CD4  217(12/13/19) Hypertension Hx of 2 prior CVA's PTVP - Tachy-brady syndrome? Hx depression Severe calorie malnutrition Hypokalemia Hx tobacco use  Slurred speech, abnormal gait, weight loss - MRI negative for CVA, irregularity in ethmoid sinus  - widespread lymphadenopathy with degenerative changes of the lumbar spine, right iliac, and pathologic changes/fractures of right ribs  - possible R axillary LN Bx tomorrow or later this week, unfortunately or our OR schedule is full with more urgent cases today. NPO after MN.   FEN: D3 diet ID:  None DVT: SCD's F/U:  TBD    LOS: 4 days    Obie Dredge, [facility] Please see Amion for pager number during day hours 7:00am-4:30pm

## 2019-12-20 NOTE — Discharge Instructions (Signed)
Pre-diabetes  Foods with carbohydrates make your blood glucose level go up. Learning how to count carbohydrates can help you control your blood glucose levels. First, identify the foods you eat that contain carbohydrates. Then, using the Foods with Carbohydrates chart, determine about how much carbohydrates are in your meals and snacks. Make sure you are eating foods with fiber, protein, and healthy fat along with your carbohydrate foods. Foods with Carbohydrates The following table shows carbohydrate foods that have about 15 grams of carbohydrate each. Using measuring cups, spoons, or a food scale when you first begin learning about carbohydrate counting can help you learn about the portion sizes you typically eat. The following foods have 15 grams carbohydrate each:  Grains . 1 slice bread (1 ounce)  . 1 small tortilla (6-inch size)  .  large bagel (1 ounce)  . 1/3 cup pasta or rice (cooked)  .  hamburger or hot dog bun ( ounce)  .  cup cooked cereal  .  to  cup ready-to-eat cereal  . 2 taco shells (5-inch size) Fruit . 1 small fresh fruit ( to 1 cup)  .  medium banana  . 17 small grapes (3 ounces)  . 1 cup melon or berries  .  cup canned or frozen fruit  . 2 tablespoons dried fruit (blueberries, cherries, cranberries, raisins)  .  cup unsweetened fruit juice  Starchy Vegetables .  cup cooked beans, peas, corn, potatoes/sweet potatoes  .  large baked potato (3 ounces)  . 1 cup acorn or butternut squash  Snack Foods . 3 to 6 crackers  . 8 potato chips or 13 tortilla chips ( ounce to 1 ounce)  . 3 cups popped popcorn  Dairy . 3/4 cup (6 ounces) nonfat plain yogurt, or yogurt with sugar-free sweetener  . 1 cup milk  . 1 cup plain rice, soy, coconut or flavored almond milk Sweets and Desserts .  cup ice cream or frozen yogurt  . 1 tablespoon jam, jelly, pancake syrup, table sugar, or honey  . 2 tablespoons light pancake syrup  . 1 inch square of frosted cake or 2 inch  square of unfrosted cake  . 2 small cookies (2/3 ounce each) or  large cookie  Sometimes you'll have to estimate carbohydrate amounts if you don't know the exact recipe. One cup of mixed foods like soups can have 1 to 2 carbohydrate servings, while some casseroles might have 2 or more servings of carbohydrate. Foods that have less than 20 calories in each serving can be counted as "free" foods. Count 1 cup raw vegetables, or  cup cooked non-starchy vegetables as "free" foods. If you eat 3 or more servings at one meal, then count them as 1 carbohydrate serving.  Foods without Carbohydrates  Not all foods contain carbohydrates. Meat, some dairy, fats, non-starchy vegetables, and many beverages don't contain carbohydrate. So when you count carbohydrates, you can generally exclude chicken, pork, beef, fish, seafood, eggs, tofu, cheese, butter, sour cream, avocado, nuts, seeds, olives, mayonnaise, water, black coffee, unsweetened tea, and zero-calorie drinks. Vegetables with no or low carbohydrate include green beans, cauliflower, tomatoes, and onions. How much carbohydrate should I eat at each meal?  Carbohydrate counting can help you plan your meals and manage your weight. Following are some starting points for carbohydrate intake at each meal. Work with your registered dietitian nutritionist to find the best range that works for your blood glucose and weight.   To Lose Weight To Maintain Weight  Women 2 -  3 carb servings 3 - 4 carb servings  Men 3 - 4 carb servings 4 - 5 carb servings  Checking your blood glucose after meals will help you know if you need to adjust the timing, type, or number of carbohydrate servings in your meal plan. Achieve and keep a healthy body weight by balancing your food intake and physical activity.  Tips How should I plan my meals?  Plan for half the food on your plate to include non-starchy vegetables, like salad greens, broccoli, or carrots. Try to eat 3 to 5 servings of  non-starchy vegetables every day. Have a protein food at each meal. Protein foods include chicken, fish, meat, eggs, or beans (note that beans contain carbohydrate). These two food groups (non-starchy vegetables and proteins) are low in carbohydrate. If you fill up your plate with these foods, you will eat less carbohydrate but still fill up your stomach. Try to limit your carbohydrate portion to  of the plate.  What fats are healthiest to eat?  Diabetes increases risk for heart disease. To help protect your heart, eat more healthy fats, such as olive oil, nuts, and avocado. Eat less saturated fats like butter, cream, and high-fat meats, like bacon and sausage. Avoid trans fats, which are in all foods that list "partially hydrogenated oil" as an ingredient. What should I drink?  Choose drinks that are not sweetened with sugar. The healthiest choices are water, carbonated or seltzer waters, and tea and coffee without added sugars.  Sweet drinks will make your blood glucose go up very quickly. One serving of soda or energy drink is  cup. It is best to drink these beverages only if your blood glucose is low.  Artificially sweetened, or diet drinks, typically do not increase your blood glucose if they have zero calories in them. Read labels of beverages, as some diet drinks do have carbohydrate and will raise your blood glucose. Label Reading Tips Read Nutrition Facts labels to find out how many grams of carbohydrate are in a food you want to eat. Don't forget: sometimes serving sizes on the label aren't the same as how much food you are going to eat, so you may need to calculate how much carbohydrate is in the food you are serving yourself.   Carbohydrate Counting for People with Diabetes Sample 1-Day Menu  Breakfast  cup yogurt, low fat, low sugar (1 carbohydrate serving)   cup cereal, ready-to-eat, unsweetened (1 carbohydrate serving)  1 cup strawberries (1 carbohydrate serving)   cup almonds (  carbohydrate serving)  Lunch 1, 5 ounce can chunk light tuna  2 ounces cheese, low fat cheddar  6 whole wheat crackers (1 carbohydrate serving)  1 small apple (1 carbohydrate servings)   cup carrots ( carbohydrate serving)   cup snap peas  1 cup 1% milk (1 carbohydrate serving)   Evening Meal Stir fry made with: 3 ounces chicken  1 cup brown rice (3 carbohydrate servings)   cup broccoli ( carbohydrate serving)   cup green beans   cup onions  1 tablespoon olive oil  2 tablespoons teriyaki sauce ( carbohydrate serving)  Evening Snack 1 extra small banana (1 carbohydrate serving)  1 tablespoon peanut butter   Carbohydrate Counting for People with Diabetes Vegan Sample 1-Day Menu  Breakfast 1 cup cooked oatmeal (2 carbohydrate servings)   cup blueberries (1 carbohydrate serving)  2 tablespoons flaxseeds  1 cup soymilk fortified with calcium and vitamin D  1 cup coffee  Lunch 2 slices  whole wheat bread (2 carbohydrate servings)   cup baked tofu   cup lettuce  2 slices tomato  2 slices avocado   cup baby carrots ( carbohydrate serving)  1 orange (1 carbohydrate serving)  1 cup soymilk fortified with calcium and vitamin D   Evening Meal Burrito made with: 1 6-inch corn tortilla (1 carbohydrate serving)  1 cup refried vegetarian beans (2 carbohydrate servings)   cup chopped tomatoes   cup lettuce   cup salsa  1/3 cup brown rice (1 carbohydrate serving)  1 tablespoon olive oil for rice   cup zucchini   Evening Snack 6 small whole grain crackers (1 carbohydrate serving)  2 apricots ( carbohydrate serving)   cup unsalted peanuts ( carbohydrate serving)    Carbohydrate Counting for People with Diabetes Vegetarian (Lacto-Ovo) Sample 1-Day Menu  Breakfast 1 cup cooked oatmeal (2 carbohydrate servings)   cup blueberries (1 carbohydrate serving)  2 tablespoons flaxseeds  1 egg  1 cup 1% milk (1 carbohydrate serving)  1 cup coffee  Lunch 2 slices whole wheat  bread (2 carbohydrate servings)  2 ounces low-fat cheese   cup lettuce  2 slices tomato  2 slices avocado   cup baby carrots ( carbohydrate serving)  1 orange (1 carbohydrate serving)  1 cup unsweetened tea  Evening Meal Burrito made with: 1 6-inch corn tortilla (1 carbohydrate serving)   cup refried vegetarian beans (1 carbohydrate serving)   cup tomatoes   cup lettuce   cup salsa  1/3 cup brown rice (1 carbohydrate serving)  1 tablespoon olive oil for rice   cup zucchini  1 cup 1% milk (1 carbohydrate serving)  Evening Snack 6 small whole grain crackers (1 carbohydrate serving)  2 apricots ( carbohydrate serving)   cup unsalted peanuts ( carbohydrate serving)    Copyright 2020  Academy of [employer] and Dietetics. All rights reserved.  Using Nutrition Labels: Carbohydrate  . Serving Size  . Look at the serving size. All the information on the label is based on this portion. Randol Kern Per Container  . The number of servings contained in the package. . Guidelines for Carbohydrate  . Look at the total grams of carbohydrate in the serving size.  . 1 carbohydrate choice = 15 grams of carbohydrate. Range of Carbohydrate Grams Per Choice  Carbohydrate Grams/Choice Carbohydrate Choices  6-10   11-20 1  21-25 1  26-35 2  36-40 2  41-50 3  51-55 3  56-65 4  66-70 4  71-80 5    Copyright 2020  Academy of [employer] Dietetics. All rights reserved.  Corrin Parker, MS, RD, [place] [facility]itian Office number [phone]

## 2019-12-20 NOTE — Progress Notes (Signed)
Physical Therapy Treatment Patient Details Name: Mike Holland MRN: [id number] DOB: April 14, 1966 Today's Date: 12/20/2019    History of Present Illness Pt is a 54yo male with PMH: HIV, GYN, stroke, pacemaker and depression who was admitted on 11/28/2019 with slurred speech and shuffling gaitx3days. Pt with report of a 20 pound weight loss in the last 2 weeks. Imaging showed pleural-based lesions t/o R chest and various lesions around ribs including pathologic fractures a R 7th rib indicitive of a malignant neoplastic event work up underway to for lymphoma    PT Comments    Pt with decline in function. Pt with progressive deconditioning and is getting progressively weaker. Pt unable to march in place in sitting due to weakness. Pt with drop in SPO2 to 84% with std pvt to chair on 3Lo2 via [place] without recovery after 5 min. RN aware. Aware medical plan still isn't determined. Acute PT to cont to follow as appropriate.    Follow Up Recommendations  Home health PT;Supervision/Assistance - 24 hour (unsure of medical plan, may need hospice)     Equipment Recommendations  None recommended by PT    Recommendations for Other Services       Precautions / Restrictions Precautions Precautions: Fall Restrictions Weight Bearing Restrictions: No    Mobility  Bed Mobility Overal bed mobility: Needs Assistance Bed Mobility: Supine to Sit     Supine to sit: Min assist     General bed mobility comments: increased time, HOB elevated  Transfers Overall transfer level: Needs assistance Equipment used: Rolling walker (2 wheeled) Transfers: Sit to/from [facility] Sit to Stand: Mod assist Stand pivot transfers: Mod assist       General transfer comment: modA to power up and steady during transition of hands, modA for walker management during std pvt to chair, pt with minimal foot clearance  Ambulation/Gait             General Gait Details: limited to chair due to SOB and  fatigue   Stairs             Wheelchair Mobility    Modified Rankin (Stroke Patients Only)       Balance Overall balance assessment: Needs assistance Sitting-balance support: No upper extremity supported;Feet supported Sitting balance-Leahy Scale: Fair     Standing balance support: Bilateral upper extremity supported Standing balance-Leahy Scale: Poor Standing balance comment: requires UE support                             Cognition Arousal/Alertness: Awake/alert Behavior During Therapy: Flat affect Overall Cognitive Status: No family/caregiver present to determine baseline cognitive functioning                                 General Comments: pt soft spoken, delayed, stated August but once re-oriented able to recall      Exercises      General Comments General comments (skin integrity, edema, etc.): SpO2 dec to 84% on 3LO2 via [place] with std pvt transfer and not coming back up, RN notified      Pertinent Vitals/Pain Pain Assessment: 0-10 Pain Score: 2  Pain Location: "aches in my ankle, all over"    Home Living Family/patient expects to be discharged to:: Private residence Living Arrangements: Alone                  Prior Function  PT Goals (current goals can now be found in the care plan section) Acute Rehab PT Goals Patient Stated Goal: to get stronger  PT Goal Formulation: With patient Time For Goal Achievement: 12/31/19 Potential to Achieve Goals: Good Progress towards PT goals: Not progressing toward goals - comment    Frequency    Min 3X/week      PT Plan Frequency needs to be updated    Co-evaluation              AM-PAC PT "6 Clicks" Mobility   Outcome Measure  Help needed turning from your back to your side while in a flat bed without using bedrails?: A Little Help needed moving from lying on your back to sitting on the side of a flat bed without using bedrails?: A Little Help  needed moving to and from a bed to a chair (including a wheelchair)?: A Lot Help needed standing up from a chair using your arms (e.g., wheelchair or bedside chair)?: A Lot Help needed to walk in hospital room?: A Lot Help needed climbing 3-5 steps with a railing? : A Lot 6 Click Score: 14    End of Session Equipment Utilized During Treatment: Gait belt Activity Tolerance: Patient limited by fatigue Patient left: in chair;with call bell/phone within reach;with chair alarm set Nurse Communication: Mobility status (dec in SPO2 to 84% on 3LO2 via [place]) PT Visit Diagnosis: Unsteadiness on feet (R26.81);Difficulty in walking, not elsewhere classified (R26.2)     Time: 2500-3704 PT Time Calculation (min) (ACUTE ONLY): 22 min  Charges:  $Therapeutic Activity: 8-22 mins                     Kittie Plater, PT, DPT Acute Rehabilitation Services Pager #: [phone] Office #: [phone]    Berline Lopes 12/20/2019, 10:57 AM

## 2019-12-20 NOTE — Progress Notes (Signed)
Patient arrives for MRI. Patient was scanned yesterday and oxygen saturations in the high 90s and more interactive. Today patient arrives on 5 liters and sats in the low 80s, appearing very lethargic and patient states" he is short of breath". Notified RN upstairs that he would be coming back as he will not tolerate laying flat at this time.

## 2019-12-21 ENCOUNTER — Inpatient Hospital Stay (HOSPITAL_COMMUNITY): Payer: Medicaid Other | Admitting: Certified Registered"

## 2019-12-21 ENCOUNTER — Encounter (HOSPITAL_COMMUNITY): Payer: Self-pay | Admitting: Internal Medicine

## 2019-12-21 ENCOUNTER — Other Ambulatory Visit: Payer: Self-pay

## 2019-12-21 ENCOUNTER — Encounter (HOSPITAL_COMMUNITY): Admission: EM | Disposition: E | Payer: Self-pay | Source: Home / Self Care | Attending: Internal Medicine

## 2019-12-21 DIAGNOSIS — E43 Unspecified severe protein-calorie malnutrition: Secondary | ICD-10-CM | POA: Insufficient documentation

## 2019-12-21 DIAGNOSIS — J9601 Acute respiratory failure with hypoxia: Secondary | ICD-10-CM

## 2019-12-21 HISTORY — PX: LYMPH NODE BIOPSY: SHX201

## 2019-12-21 LAB — CBC WITH DIFFERENTIAL/PLATELET
Abs Immature Granulocytes: 0.93 10*3/uL — ABNORMAL HIGH (ref 0.00–0.07)
Basophils Absolute: 0.1 10*3/uL (ref 0.0–0.1)
Basophils Relative: 1 %
Eosinophils Absolute: 0.1 10*3/uL (ref 0.0–0.5)
Eosinophils Relative: 1 %
HCT: 27 % — ABNORMAL LOW (ref 39.0–52.0)
Hemoglobin: 8.5 g/dL — ABNORMAL LOW (ref 13.0–17.0)
Immature Granulocytes: 17 %
Lymphocytes Relative: 15 %
Lymphs Abs: 0.8 10*3/uL (ref 0.7–4.0)
MCH: 28.9 pg (ref 26.0–34.0)
MCHC: 31.5 g/dL (ref 30.0–36.0)
MCV: 91.8 fL (ref 80.0–100.0)
Monocytes Absolute: 0.3 10*3/uL (ref 0.1–1.0)
Monocytes Relative: 5 %
Neutro Abs: 3.4 10*3/uL (ref 1.7–7.7)
Neutrophils Relative %: 61 %
Platelets: 55 10*3/uL — ABNORMAL LOW (ref 150–400)
RBC: 2.94 MIL/uL — ABNORMAL LOW (ref 4.22–5.81)
RDW: 15 % (ref 11.5–15.5)
WBC: 5.5 10*3/uL (ref 4.0–10.5)
nRBC: 1.4 % — ABNORMAL HIGH (ref 0.0–0.2)

## 2019-12-21 LAB — COMPREHENSIVE METABOLIC PANEL
ALT: 17 U/L (ref 0–44)
AST: 34 U/L (ref 15–41)
Albumin: 1.9 g/dL — ABNORMAL LOW (ref 3.5–5.0)
Alkaline Phosphatase: 171 U/L — ABNORMAL HIGH (ref 38–126)
Anion gap: 14 (ref 5–15)
BUN: 17 mg/dL (ref 6–20)
CO2: 25 mmol/L (ref 22–32)
Calcium: 10.7 mg/dL — ABNORMAL HIGH (ref 8.9–10.3)
Chloride: 95 mmol/L — ABNORMAL LOW (ref 98–111)
Creatinine, Ser: 0.8 mg/dL (ref 0.61–1.24)
GFR calc Af Amer: >60 mL/min (ref >60)
GFR calc non Af Amer: >60 mL/min (ref >60)
Glucose, Bld: 132 mg/dL — ABNORMAL HIGH (ref 70–99)
Potassium: 4.4 mmol/L (ref 3.5–5.1)
Sodium: 134 mmol/L — ABNORMAL LOW (ref 135–145)
Total Bilirubin: 0.7 mg/dL (ref 0.3–1.2)
Total Protein: 6.2 g/dL — ABNORMAL LOW (ref 6.5–8.1)

## 2019-12-21 LAB — GLUCOSE, CAPILLARY
Glucose-Capillary: 89 mg/dL (ref 70–99)
Glucose-Capillary: 83 mg/dL (ref 70–99)
Glucose-Capillary: 104 mg/dL — ABNORMAL HIGH (ref 70–99)
Glucose-Capillary: 135 mg/dL — ABNORMAL HIGH (ref 70–99)

## 2019-12-21 LAB — PHOSPHORUS: Phosphorus: 5.1 mg/dL — ABNORMAL HIGH (ref 2.5–4.6)

## 2019-12-21 LAB — MAGNESIUM: Magnesium: 1.6 mg/dL — ABNORMAL LOW (ref 1.7–2.4)

## 2019-12-21 LAB — CD19 AND CD20, FLOW CYTOMETRY

## 2019-12-21 LAB — D-DIMER, QUANTITATIVE: D-Dimer, Quant: 7.71 ug/mL-FEU — ABNORMAL HIGH (ref 0.00–0.50)

## 2019-12-21 SURGERY — LYMPH NODE BIOPSY
Anesthesia: General | Site: Axilla | Laterality: Right

## 2019-12-21 MED ORDER — FENTANYL CITRATE (PF) 250 MCG/5ML IJ SOLN
INTRAMUSCULAR | Status: AC
  Filled 2019-12-21: qty 5

## 2019-12-21 MED ORDER — ORAL CARE MOUTH RINSE: 15.0000 mL | Freq: Once | OROMUCOSAL | Status: AC

## 2019-12-21 MED ORDER — CEFAZOLIN SODIUM-DEXTROSE 2-4 GM/100ML-% IV SOLN
INTRAVENOUS | Status: AC
  Filled 2019-12-21: qty 100

## 2019-12-21 MED ORDER — MIDAZOLAM HCL 2 MG/2ML IJ SOLN
INTRAMUSCULAR | Status: AC
  Filled 2019-12-21: qty 2

## 2019-12-21 MED ORDER — 0.9 % SODIUM CHLORIDE (POUR BTL) OPTIME
TOPICAL | Status: DC | PRN
  Administered 2019-12-21: 1000 mL

## 2019-12-21 MED ORDER — HYDROMORPHONE HCL 1 MG/ML IJ SOLN: 0.5000 mg | INTRAMUSCULAR | Status: DC | PRN

## 2019-12-21 MED ORDER — DEXAMETHASONE SODIUM PHOSPHATE 10 MG/ML IJ SOLN
INTRAMUSCULAR | Status: AC
  Filled 2019-12-21: qty 1

## 2019-12-21 MED ORDER — FENTANYL CITRATE (PF) 100 MCG/2ML IJ SOLN
INTRAMUSCULAR | Status: DC | PRN
  Administered 2019-12-21: 50 ug via INTRAVENOUS

## 2019-12-21 MED ORDER — DEXAMETHASONE SODIUM PHOSPHATE 10 MG/ML IJ SOLN
INTRAMUSCULAR | Status: DC | PRN
  Administered 2019-12-21: 5 mg via INTRAVENOUS

## 2019-12-21 MED ORDER — OXYCODONE HCL 5 MG/5ML PO SOLN: 5.0000 mg | Freq: Once | ORAL | Status: DC | PRN

## 2019-12-21 MED ORDER — LIDOCAINE 2% (20 MG/ML) 5 ML SYRINGE
INTRAMUSCULAR | Status: AC
  Filled 2019-12-21: qty 5

## 2019-12-21 MED ORDER — ONDANSETRON HCL 4 MG/2ML IJ SOLN
INTRAMUSCULAR | Status: DC | PRN
  Administered 2019-12-21: 4 mg via INTRAVENOUS

## 2019-12-21 MED ORDER — LIDOCAINE 2% (20 MG/ML) 5 ML SYRINGE
INTRAMUSCULAR | Status: DC | PRN
  Administered 2019-12-21: 60 mg via INTRAVENOUS

## 2019-12-21 MED ORDER — BUPIVACAINE-EPINEPHRINE 0.5% -1:200000 IJ SOLN
INTRAMUSCULAR | Status: AC
  Filled 2019-12-21: qty 1

## 2019-12-21 MED ORDER — BUPIVACAINE-EPINEPHRINE 0.5% -1:200000 IJ SOLN
INTRAMUSCULAR | Status: DC | PRN
  Administered 2019-12-21: 7 mL

## 2019-12-21 MED ORDER — POLYETHYLENE GLYCOL 3350 17 G PO PACK
17.0000 g | PACK | Freq: Every day | ORAL | Status: DC
  Administered 2019-12-21 – 2019-12-26 (×6): 17 g via ORAL
  Filled 2019-12-21 (×7): qty 1

## 2019-12-21 MED ORDER — FENTANYL CITRATE (PF) 100 MCG/2ML IJ SOLN: 25.0000 ug | INTRAMUSCULAR | Status: DC | PRN

## 2019-12-21 MED ORDER — OXYCODONE HCL 5 MG PO TABS: 5.0000 mg | ORAL_TABLET | Freq: Once | ORAL | Status: DC | PRN

## 2019-12-21 MED ORDER — CHLORHEXIDINE GLUCONATE 0.12 % MT SOLN
15.0000 mL | Freq: Once | OROMUCOSAL | Status: AC
  Administered 2019-12-21: 15 mL via OROMUCOSAL

## 2019-12-21 MED ORDER — ONDANSETRON HCL 4 MG/2ML IJ SOLN: 4.0000 mg | Freq: Once | INTRAMUSCULAR | Status: DC | PRN

## 2019-12-21 MED ORDER — CEFAZOLIN SODIUM-DEXTROSE 2-3 GM-%(50ML) IV SOLR
INTRAVENOUS | Status: DC | PRN
  Administered 2019-12-21: 2 g via INTRAVENOUS

## 2019-12-21 MED ORDER — PROPOFOL 10 MG/ML IV BOLUS
INTRAVENOUS | Status: DC | PRN
  Administered 2019-12-21: 150 mg via INTRAVENOUS

## 2019-12-21 MED ORDER — MIDAZOLAM HCL 5 MG/5ML IJ SOLN
INTRAMUSCULAR | Status: DC | PRN
  Administered 2019-12-21: 1 mg via INTRAVENOUS

## 2019-12-21 MED ORDER — LACTATED RINGERS IV SOLN: INTRAVENOUS | Status: DC

## 2019-12-21 MED ORDER — ONDANSETRON HCL 4 MG/2ML IJ SOLN
INTRAMUSCULAR | Status: AC
  Filled 2019-12-21: qty 2

## 2019-12-21 SURGICAL SUPPLY — 35 items
APPLIER CLIP 11 MED OPEN (CLIP) ×3
APPLIER CLIP 9.375 SM OPEN (CLIP) ×3
CHLORAPREP W/TINT 10.5 ML (MISCELLANEOUS) ×3 IMPLANT
CLIP APPLIE 11 MED OPEN (CLIP) ×1 IMPLANT
CLIP APPLIE 9.375 SM OPEN (CLIP) ×1 IMPLANT
CNTNR URN SCR LID CUP LEK RST (MISCELLANEOUS) ×2 IMPLANT
CONT SPEC 4OZ STRL OR WHT (MISCELLANEOUS) ×4
COVER SURGICAL LIGHT HANDLE (MISCELLANEOUS) ×3 IMPLANT
COVER WAND RF STERILE (DRAPES) ×3 IMPLANT
DECANTER SPIKE VIAL GLASS SM (MISCELLANEOUS) IMPLANT
DERMABOND ADVANCED (GAUZE/BANDAGES/DRESSINGS) ×2
DERMABOND ADVANCED .7 DNX12 (GAUZE/BANDAGES/DRESSINGS) ×1 IMPLANT
DRAPE LAPAROTOMY 100X72 PEDS (DRAPES) ×3 IMPLANT
ELECT REM PT RETURN 9FT ADLT (ELECTROSURGICAL) ×3
ELECTRODE REM PT RTRN 9FT ADLT (ELECTROSURGICAL) ×1 IMPLANT
GAUZE 4X4 16PLY RFD (DISPOSABLE) ×3 IMPLANT
GLOVE SURG SIGNA 7.5 PF LTX (GLOVE) ×3 IMPLANT
GOWN STRL REUS W/ TWL LRG LVL3 (GOWN DISPOSABLE) ×1 IMPLANT
GOWN STRL REUS W/ TWL XL LVL3 (GOWN DISPOSABLE) ×1 IMPLANT
GOWN STRL REUS W/TWL LRG LVL3 (GOWN DISPOSABLE) ×2
GOWN STRL REUS W/TWL XL LVL3 (GOWN DISPOSABLE) ×2
KIT BASIN OR (CUSTOM PROCEDURE TRAY) ×3 IMPLANT
KIT TURNOVER KIT B (KITS) ×3 IMPLANT
NEEDLE HYPO 25GX1X1/2 BEV (NEEDLE) ×3 IMPLANT
NS IRRIG 1000ML POUR BTL (IV SOLUTION) ×3 IMPLANT
PACK GENERAL/GYN (CUSTOM PROCEDURE TRAY) ×3 IMPLANT
PAD ARMBOARD 7.5X6 YLW CONV (MISCELLANEOUS) ×3 IMPLANT
PENCIL SMOKE EVACUATOR (MISCELLANEOUS) ×3 IMPLANT
SUT MNCRL AB 4-0 PS2 18 (SUTURE) ×3 IMPLANT
SUT VIC AB 3-0 SH 27 (SUTURE) ×2
SUT VIC AB 3-0 SH 27XBRD (SUTURE) ×1 IMPLANT
SYR BULB IRRIG 60ML STRL (SYRINGE) ×3 IMPLANT
SYR CONTROL 10ML LL (SYRINGE) ×3 IMPLANT
TOWEL GREEN STERILE (TOWEL DISPOSABLE) ×3 IMPLANT
TOWEL GREEN STERILE FF (TOWEL DISPOSABLE) ×3 IMPLANT

## 2019-12-21 NOTE — Anesthesia Preprocedure Evaluation (Addendum)
Anesthesia Evaluation  Patient identified by MRN, date of birth, ID band Patient awake    Reviewed: Allergy & Precautions, NPO status , Patient's Chart, lab work & pertinent test results, reviewed documented beta blocker date and time   History of Anesthesia Complications Negative for: history of anesthetic complications  Airway Mallampati: II  TM Distance: >3 FB Neck ROM: Full    Dental  (+) Poor Dentition, Dental Advisory Given, Missing   Pulmonary neg pulmonary ROS, former smoker,    Pulmonary exam normal        Cardiovascular hypertension, Pt. on medications and Pt. on home beta blockers Normal cardiovascular exam+ dysrhythmias (amiodarone drip for tachycardia) + pacemaker (not pacer dependent)      Neuro/Psych negative neurological ROS  negative psych ROS   GI/Hepatic negative GI ROS, (+)     substance abuse  marijuana use,   Endo/Other  negative endocrine ROS  Renal/GU negative Renal ROS  negative genitourinary   Musculoskeletal negative musculoskeletal ROS (+)   Abdominal   Peds  Hematology  (+) anemia , HIV, Suspected lymphoma   Anesthesia Other Findings   Reproductive/Obstetrics                           Anesthesia Physical Anesthesia Plan  ASA: III  Anesthesia Plan: General   Post-op Pain Management:    Induction: Intravenous  PONV Risk Score and Plan: 2 and Ondansetron, Dexamethasone, Midazolam and Treatment may vary due to age or medical condition  Airway Management Planned: LMA  Additional Equipment: None  Intra-op Plan:   Post-operative Plan: Extubation in OR  Informed Consent: I have reviewed the patients History and Physical, chart, labs and discussed the procedure including the risks, benefits and alternatives for the proposed anesthesia with the patient or authorized representative who has indicated his/her understanding and acceptance.   Patient has DNR.   Discussed DNR with patient and Suspend DNR.   Dental advisory given  Plan Discussed with: CRNA, Anesthesiologist and Surgeon  Anesthesia Plan Comments:        Anesthesia Quick Evaluation

## 2019-12-21 NOTE — Progress Notes (Signed)
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  54 year old male with past medical history for HIV 1996, history of CVA, hypertension, depression, dyslipidemia and tachybrady syndrome status post pacemaker.   Patient reported 3 days of slurred speech and shuffling gait.  Positive weight loss 20 pounds in 1 month, generalized weakness and decreased appetite.  On his initial physical examination blood pressure 115/74, heart rate 105, respiratory rate 16, temperature 98.3, oxygen saturation 98%, his lungs were clear to auscultation bilaterally, heart S1-S2, present rhythmic, abdomen soft, no lower extremity edema.  Patient had decreased strength 4/5 in lower extremities. Sodium 134, potassium 2.7, chloride 93, bicarb 24, glucose 113, BUN 70, creatinine 0.71, AST 29, ALT 13, white count 12.1, hemoglobin 9.9, hematocrit 31.3, platelets 156.  SARS COVID-19 negative.  Patient was placed in frequent neuro checks and neurology was consulted.  Further work-up with brain MRI which showed suboptimal evaluation due to significant artifact.  Abnormal marrow signal which may reflect hematopoietic marrow or lymphomatous involvement.   CT chest and abdomen with widespread neoplastic involvement, considerable retroperitoneal adenopathy, enhancing pleural-based lesions throughout the right chest.  Surgery was consulted and patient underwent excisional biopsy right axillary deep lymph node on July 28.   Assessment & Plan:   Principal Problem:   Widespread metastatic malignant neoplastic disease ([place]) Active Problems:   HIV disease ([place])   Essential hypertension   Depression   Unintentional weight loss   HLD (hyperlipidemia)   Pacemaker   Hypokalemia   Hypomagnesemia   Normocytic anemia    Marijuana abuse   Slurred speech   Palliative care by specialist   Goals of care, counseling/discussion   DNR (do not resuscitate)   Acute respiratory failure with hypoxia ([place])   Protein-calorie malnutrition, severe   1. 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 Patient with no confusion or agitation, speech stable, on dysphagia 2 diet. Continue to need assistance for mobility, 24 h supervision.   Follow with deep axillary lymph node biopsy. Plan for further workup with spine MRI in am. Continue physical and occupational therapy.   CT with extensive retroperitoneal lymphadenopathy, continue pain control with as needed hydromorphone.   2. Acute hypoxic respiratory failure/ positive right pleural based lesions, right lung cystic changes, right 7th rib pathologic fracture. Patient denies dyspnea, continue to require supplemental 02 per [place], today on 5 L per min with oxygenation 93%.   Continue oxymetry monitoring and supplemental 02 per [place] to target 02 saturation more than 92%. Add incentive spirometer to use every 4 h while awake.   3. Hypokalemia and hypomagnesemia. Patient with very poor oral intake, yesterday K 4,2 and Mg 1,9.  Will follow today's blood work, keep K at 4 and Mg at 2.   4. Severe calorie protein malnutrition/ chronic anemia. Hgb yesterday down to 7,7 and hct at 24,4. Will continue close monitoring. Plan for PRBC transfusion for hgb less than 7.0.   Continue nutritional supplements and close follow up on P, for possible refeeding syndrome.   5. HIV with oral candidiasis. Continue with antiretroviral therapy with BIKTARVY  6. Depression and bipolar. No agitation, continue with risperidone.   7. Tachy-brady syndrome sp pacer.  HR has remained controlled, will discontinue amiodarone drip for now. Continue metoprolol and close telemetry monitoring.   8. HTN/  dyslipidemia and pre-diabetes. Continue blood pressure control with amlodipine and metoprolol.   Continue  with statin therapy. Fating glucose has been 101 and 94, will discontinue insulin therapy for now.    Patient continue to be at high risk for worsening neurologic deficit.   Status is: Inpatient  Remains inpatient appropriate because:Inpatient level of care appropriate due to severity of illness   Dispo: The patient is from: Home              Anticipated d/c is to: SNF              Anticipated d/c date is: 2 days              Patient currently is not medically stable to d/c.    DVT prophylaxis: Enoxaparin   Code Status:   dnr   Family Communication:  No family at the bedside      Nutrition Status: Nutrition Problem: Severe Malnutrition Etiology: chronic illness (HIV) Signs/Symptoms: percent weight loss, severe muscle depletion Percent weight loss: 9.7 % (1 month) Interventions: Ensure Enlive (each supplement provides 350kcal and 20 grams of protein), Magic cup, Prostat     Consultants:   Neurology   Surgery   Procedures:   Right axillary lymph node biopsy       Subjective: Patient very weak and deconditioned, complains of constipation, no nausea or vomiting, positive dyspnea on exertion   Objective: Vitals:   12/07/2019 1054 12/24/2019 1111 12/09/2019 1114 12/18/2019 1145  BP: 98/66   101/80  Pulse: 85     Resp: 20   21  Temp: (!) 97.5 F (36.4 C)   97.8 F (36.6 C)  TempSrc: Oral   Oral  SpO2: 94% (!) 89% 92% 93%  Weight:      Height:        Intake/Output Summary (Last 24 hours) at 12/03/2019 1206 Last data filed at 12/20/2019 1025 Gross per 24 hour  Intake 2409.51 ml  Output 1030 ml  Net 1379.51 ml   Filed Weights   12/20/19 0500 12/20/19 1011 11/29/2019 0458  Weight: 59.4 kg 59.4 kg 59.8 kg    Examination:   General: deconditioned and ill looking appearing  Neurology: Awake and alert, non focal  E ENT: positive pallor, no icterus, oral mucosa moist Cardiovascular: No JVD. S1-S2 present, rhythmic, no gallops, rubs, or murmurs. No lower extremity  edema. Pulmonary: positive breath sounds bilaterally, adequate air movement, no wheezing, rhonchi or rales. Gastrointestinal. Abdomen soft and non tender Skin. No rashes Musculoskeletal: no joint deformities     Data Reviewed: I have personally reviewed following labs and imaging studies  CBC: Recent Labs  Lab 12/18/2019 1135 11/25/2019 1135 12/22/2019 1637 12/17/19 0536 12/18/19 0349 12/19/19 0516 12/20/19 0442  WBC 12.1*   < > 10.4 9.0 6.9 5.6 4.8  NEUTROABS 7.4  --   --   --  4.3 3.0 2.4  HGB 9.9*   < > 9.2* 9.4* 8.8* 8.5* 7.7*  HCT 31.3*   < > 28.9* 28.9* 27.2* 26.1* 24.4*  MCV 92.3   < > 92.0 92.0 92.2 92.2 92.8  PLT 156   < > 140* 136* 101* 70* 58*   < > = values in this interval not displayed.   Basic Metabolic Panel: Recent Labs  Lab 11/28/2019 1135 12/04/2019 1135 12/14/2019 1300 11/29/2019 1637 12/17/19 0536 12/18/19 0349 12/19/19 0516 12/20/19 0442  NA 134*  --   --   --  137 136 136 135  K 2.7*  --   --   --  3.0* 4.3 4.2 4.2  CL 93*  --   --   --  95* 98 96* 96*  CO2 24  --   --   --  26 25 24 25   GLUCOSE 113*  --   --   --  68* 128* 101* 94  BUN 7  --   --   --  6 7 9 15   CREATININE 0.71   < >  --  0.58* 0.70 0.61 0.74 0.69  CALCIUM 10.6*  --   --   --  10.0 10.2 10.3 10.2  MG  --   --  1.3*  --  1.6* 1.7 1.5* 1.9  PHOS  --   --   --  3.2  --  2.2* 3.5 3.9   < > = values in this interval not displayed.   GFR: Estimated Creatinine Clearance: 90.3 mL/min (by C-G formula based on SCr of 0.69 mg/dL). Liver Function Tests: Recent Labs  Lab 12/11/2019 1135 12/17/19 0536 12/18/19 0349 12/19/19 0516 12/20/19 0442  AST 29 26 34 39 27  ALT 13 12 13 16 14   ALKPHOS 183* 159* 156* 169* 158*  BILITOT 0.9 1.5* 0.8 0.8 0.4  PROT 6.8 6.2* 6.1* 6.0* 5.6*  ALBUMIN 2.2* 2.0* 2.5* 2.1* 1.9*   No results for input(s): LIPASE, AMYLASE in the last 168 hours. No results for input(s): AMMONIA in the last 168 hours. Coagulation Profile: Recent Labs  Lab 12/13/2019 1135    INR 1.4*   Cardiac Enzymes: No results for input(s): CKTOTAL, CKMB, CKMBINDEX, TROPONINI in the last 168 hours. BNP (last 3 results) No results for input(s): PROBNP in the last 8760 hours. HbA1C: No results for input(s): HGBA1C in the last 72 hours. CBG: Recent Labs  Lab 12/20/19 2223 12/20/19 2343 12/18/2019 0316 12/03/2019 0742 12/17/2019 1145  GLUCAP 112* 102* 89 83 104*   Lipid Profile: No results for input(s): CHOL, HDL, LDLCALC, TRIG, CHOLHDL, LDLDIRECT in the last 72 hours. Thyroid Function Tests: No results for input(s): TSH, T4TOTAL, FREET4, T3FREE, THYROIDAB in the last 72 hours. Anemia Panel: No results for input(s): VITAMINB12, FOLATE, FERRITIN, TIBC, IRON, RETICCTPCT in the last 72 hours.    Radiology Studies: I have reviewed all of the imaging during this hospital visit personally     Scheduled Meds: . (feeding supplement) PROSource Plus  30 mL Oral BID BM  . amLODipine  10 mg Oral Daily  . atorvastatin  20 mg Oral Daily  . bictegravir-emtricitabine-tenofovir AF  1 tablet Oral Daily  . dronabinol  5 mg Oral BID AC  . enoxaparin (LOVENOX) injection  40 mg Subcutaneous Q24H  . feeding supplement (ENSURE ENLIVE)  237 mL Oral TID BM  . folic acid  1 mg Oral QPM  . insulin aspart  0-9 Units Subcutaneous Q4H  . metoprolol succinate  37.5 mg Oral QPM  . nystatin  5 mL Oral QID  . polyethylene glycol  17 g Oral Daily  . risperiDONE  2 mg Oral QHS  . sodium chloride flush  10-40 mL Intracatheter Q12H   Continuous Infusions: . amiodarone 30 mg/hr (11/24/2019 0936)  . ceFAZolin       LOS: 5 days        Kingstyn Deruiter Gerome Apley, MD

## 2019-12-21 NOTE — Anesthesia Procedure Notes (Signed)
Procedure Name: LMA Insertion Date/Time: 12/13/2019 9:49 AM Performed by: Moshe Salisbury, CRNA Pre-anesthesia Checklist: Patient identified, Emergency Drugs available, Suction available and Patient being monitored Patient Re-evaluated:Patient Re-evaluated prior to induction Oxygen Delivery Method: Circle System Utilized Preoxygenation: Pre-oxygenation with 100% oxygen Induction Type: IV induction Ventilation: Mask ventilation without difficulty LMA: LMA inserted LMA Size: 4.0 Number of attempts: 1 Placement Confirmation: positive ETCO2 Tube secured with: Tape Dental Injury: Teeth and Oropharynx as per pre-operative assessment

## 2019-12-21 NOTE — Progress Notes (Signed)
Patient ID: Mike Holland, male   DOB: 1965/05/28, 54 y.o.   MRN: [id number]   Pre Procedure note for inpatients:   COLT MARTELLE has been scheduled for Procedure(s): Excisional Biopsy Right Axillary Lymph Node. (Right) today. The various methods of treatment have been discussed with the patient. After consideration of the risks, benefits and treatment options the patient has consented to the planned procedure.   The patient has been seen and labs reviewed. There are no changes in the patient's condition to prevent proceeding with the planned procedure today.  Recent labs:  Lab Results  Component Value Date   WBC 4.8 12/20/2019   HGB 7.7 (L) 12/20/2019   HCT 24.4 (L) 12/20/2019   PLT 58 (L) 12/20/2019   GLUCOSE 94 12/20/2019   CHOL 99 12/18/2019   TRIG 151 (H) 12/18/2019   HDL 17 (L) 12/18/2019   LDLCALC 52 12/18/2019   ALT 14 12/20/2019   AST 27 12/20/2019   NA 135 12/20/2019   K 4.2 12/20/2019   CL 96 (L) 12/20/2019   CREATININE 0.69 12/20/2019   BUN 15 12/20/2019   CO2 25 12/20/2019   TSH 1.564 06/22/2019   INR 1.4 (H) 12/15/2019   HGBA1C 6.1 (H) 12/08/2019    Coralie Keens, MD 12/18/2019 9:03 AM

## 2019-12-21 NOTE — Progress Notes (Signed)
OT Cancellation Note  Patient Details Name: Mike Holland MRN: [id number] DOB: [birth year]   Cancelled Treatment:    Reason Eval/Treat Not Completed: Patient at procedure or test/ unavailable (pt is in sx for Excisional Biopsy Right Axillary Lymph Node. Will continue to follow as available and appropriate)  Zenovia Jarred, MSOT, OTR/L [facility] Office Number: [phone] Pager: [phone]  Zenovia Jarred 12/06/2019, 9:11 AM

## 2019-12-21 NOTE — Anesthesia Postprocedure Evaluation (Signed)
Anesthesia Post Note  Patient: Mike Holland  Procedure(s) Performed: Excisional Biopsy Right Axillary Lymph Node. (Right Axilla)     Patient location during evaluation: PACU Anesthesia Type: General Level of consciousness: awake and alert Pain management: pain level controlled Vital Signs Assessment: post-procedure vital signs reviewed and stable Respiratory status: spontaneous breathing, nonlabored ventilation and respiratory function stable Cardiovascular status: blood pressure returned to baseline and stable Postop Assessment: no apparent nausea or vomiting Anesthetic complications: no   No complications documented.  Last Vitals:  Vitals:   11/25/2019 1145 12/12/2019 1531  BP: 101/80 108/80  Pulse:    Resp: 21   Temp: 36.6 C 36.7 C  SpO2: 93%     Last Pain:  Vitals:   12/19/2019 1531  TempSrc: Oral  PainSc:                  Lidia Collum

## 2019-12-21 NOTE — Transfer of Care (Signed)
Immediate Anesthesia Transfer of Care Note  Patient: Mike Holland  Procedure(s) Performed: Excisional Biopsy Right Axillary Lymph Node. (Right Axilla)  Patient Location: PACU  Anesthesia Type:General  Level of Consciousness: drowsy and patient cooperative  Airway & Oxygen Therapy: Patient Spontanous Breathing and Patient connected to nasal cannula oxygen  Post-op Assessment: Report given to RN, Post -op Vital signs reviewed and stable and Patient moving all extremities  Post vital signs: Reviewed and stable  Last Vitals:  Vitals Value Taken Time  BP 99/70 12/20/2019 1024  Temp    Pulse 88 12/22/2019 1025  Resp 18 12/20/2019 1025  SpO2 97 % 12/24/2019 1025  Vitals shown include unvalidated device data.  Last Pain:  Vitals:   12/07/2019 0744  TempSrc: Oral  PainSc: 0-No pain      Patients Stated Pain Goal: 0 (80/03[birth year]49 1791)  Complications: No complications documented.

## 2019-12-21 NOTE — Op Note (Addendum)
   Mike Holland 11/30/2019   Pre-op Diagnosis: lymphadenopathy, r/o lymphoma     Post-op Diagnosis: same  Procedure(s): Excisional Biopsy Right Deep Axillary Lymph Node.  Surgeon(s): Coralie Keens, MD  Anesthesia: General  Staff:  Circulator: Hal Morales, RN Scrub Person: Romero Liner, CST  Estimated Blood Loss: Minimal               Specimens: sent to path  Indications: This is a 54 year old gentleman with multiple medical problems occluding HIV who presented with lymphadenopathy and suspected metastatic disease of unknown source.  The decision has been made to proceed to the operating room for excisional biopsy of right axillary lymph nodes  Procedure: Patient was brought to the operating room identifies correct patient.  He was placed upon the operating table general anesthesia was induced.  His right axilla was then prepped and draped in usual sterile fashion.  I anesthetized skin with Marcaine and made incision with a scalpel.  I then dissected into the deep axillary tissue and identified 1 large necrotic lymph node with several other adjacent lymph nodes.  These were excised with the aid of the electrocautery as well as surgical clips.  The nodes were sent to pathology for evaluation.  Hemostasis appeared to be achieved.  I irrigated the wound with saline.  I then closed the subcutaneous tissue with interrupted 3-0 Vicryl sutures and closed skin with a running 4-0 Monocryl.  Dermabond was then applied.  Patient tolerated procedure well.  All the counts were correct at the end of the procedure.  The patient was then extubated in the operating room and taken in stable condition to the recovery room.            Coralie Keens   Date: 12/02/2019  Time: 10:17 AM

## 2019-12-21 NOTE — Progress Notes (Signed)
Notified Dr. Christella Hartigan of pt's pacemaker--requesting [employer] rep to be present for surgery.  Called and spoke with [place], stated she would try to obtain rep with short notice.

## 2019-12-22 ENCOUNTER — Encounter (HOSPITAL_COMMUNITY): Payer: Self-pay | Admitting: Surgery

## 2019-12-22 ENCOUNTER — Inpatient Hospital Stay (HOSPITAL_COMMUNITY): Payer: Medicaid Other

## 2019-12-22 LAB — CBC WITH DIFFERENTIAL/PLATELET
Abs Immature Granulocytes: 1.12 10*3/uL — ABNORMAL HIGH (ref 0.00–0.07)
Basophils Absolute: 0.1 10*3/uL (ref 0.0–0.1)
Basophils Relative: 1 %
Eosinophils Absolute: 0 10*3/uL (ref 0.0–0.5)
Eosinophils Relative: 1 %
HCT: 26.5 % — ABNORMAL LOW (ref 39.0–52.0)
Hemoglobin: 8.3 g/dL — ABNORMAL LOW (ref 13.0–17.0)
Immature Granulocytes: 21 %
Lymphocytes Relative: 18 %
Lymphs Abs: 1 10*3/uL (ref 0.7–4.0)
MCH: 29 pg (ref 26.0–34.0)
MCHC: 31.3 g/dL (ref 30.0–36.0)
MCV: 92.7 fL (ref 80.0–100.0)
Monocytes Absolute: 0.2 10*3/uL (ref 0.1–1.0)
Monocytes Relative: 5 %
Neutro Abs: 2.9 10*3/uL (ref 1.7–7.7)
Neutrophils Relative %: 54 %
Platelets: 52 10*3/uL — ABNORMAL LOW (ref 150–400)
RBC: 2.86 MIL/uL — ABNORMAL LOW (ref 4.22–5.81)
RDW: 15.1 % (ref 11.5–15.5)
WBC: 5.3 10*3/uL (ref 4.0–10.5)
nRBC: 2.5 % — ABNORMAL HIGH (ref 0.0–0.2)

## 2019-12-22 LAB — GLUCOSE, CAPILLARY
Glucose-Capillary: 101 mg/dL — ABNORMAL HIGH (ref 70–99)
Glucose-Capillary: 122 mg/dL — ABNORMAL HIGH (ref 70–99)
Glucose-Capillary: 147 mg/dL — ABNORMAL HIGH (ref 70–99)
Glucose-Capillary: 161 mg/dL — ABNORMAL HIGH (ref 70–99)
Glucose-Capillary: 186 mg/dL — ABNORMAL HIGH (ref 70–99)
Glucose-Capillary: 202 mg/dL — ABNORMAL HIGH (ref 70–99)

## 2019-12-22 LAB — BASIC METABOLIC PANEL
Anion gap: 16 — ABNORMAL HIGH (ref 5–15)
BUN: 25 mg/dL — ABNORMAL HIGH (ref 6–20)
CO2: 23 mmol/L (ref 22–32)
Calcium: 10.8 mg/dL — ABNORMAL HIGH (ref 8.9–10.3)
Chloride: 99 mmol/L (ref 98–111)
Creatinine, Ser: 0.83 mg/dL (ref 0.61–1.24)
GFR calc Af Amer: >60 mL/min (ref >60)
GFR calc non Af Amer: >60 mL/min (ref >60)
Glucose, Bld: 105 mg/dL — ABNORMAL HIGH (ref 70–99)
Potassium: 4.7 mmol/L (ref 3.5–5.1)
Sodium: 138 mmol/L (ref 135–145)

## 2019-12-22 LAB — PHOSPHORUS: Phosphorus: 4.9 mg/dL — ABNORMAL HIGH (ref 2.5–4.6)

## 2019-12-22 MED ORDER — LIVING WELL WITH DIABETES BOOK
Freq: Once | Status: AC
  Filled 2019-12-22: qty 1

## 2019-12-22 MED ORDER — GADOBUTROL 1 MMOL/ML IV SOLN
6.0000 mL | Freq: Once | INTRAVENOUS | Status: AC | PRN
  Administered 2019-12-22: 6 mL via INTRAVENOUS

## 2019-12-22 NOTE — Progress Notes (Addendum)
Inpatient Diabetes Program Recommendations  AACE/ADA: New Consensus Statement on Inpatient Glycemic Control (2015)  Target Ranges:  Prepandial:   less than 140 mg/dL      Peak postprandial:   less than 180 mg/dL (1-2 hours)      Critically ill patients:  140 - 180 mg/dL   Lab Results  Component Value Date   GLUCAP 186 (H) 12/22/2019   HGBA1C 6.1 (H) 12/01/2019    Note:  Referral for new onset pre-diabetes.  Ordered Living Well with Diabetes, RD consult, education from staff and attached education to AVS on diabetes. Attempted to see patient and he is off floor.  Will plan to see patient prior to DC.  CBG's trending well.    Will continue to follow while inpatient.  Thank you, Reche Dixon, RN, BSN Diabetes Coordinator Inpatient Diabetes Program [phone] (team pager from 8a-5p)

## 2019-12-22 NOTE — Final Consult Note (Addendum)
Consultant Final Sign-Off Note    Assessment/Final recommendations  Mike Holland is a 54 y.o. male followed by me for excisional biopsy right axillary lymph node for tissue diagnosis of suspected metastatic disease of unknown source. He is now POD#1 s/p excisional biopsy R axilla. Pain controlled, no reported wound issues overnight. tolerating PO.    Wound care (if applicable): clean wound daily with warm water and mild soap.   Diet at discharge: per primary   Activity at discharge: no heavy lifting, pushing, or pulling for 4 weeks.    Follow-up appointment:   None required. Sutures are absorbable. Call as needed.   Pending results:  Unresulted Labs (From admission, onward) Comment          Start     Ordered   12/20/19 0500  Phosphorus  Daily,   R     Question:  Specimen collection method  Answer:  Lab=Lab collect   12/19/19 1213           Medication recommendations:   Other recommendations:    Thank you for allowing [place] to participate in the care of your patient!  Please consult [place] again if you have further needs for your patient.  Darci Current Namari Breton 12/22/2019 10:00 AM    Subjective   NAEO. Comfortable.  Objective  Vital signs in last 24 hours: Temp:  [97.5 F (36.4 C)-98.3 F (36.8 C)] 97.8 F (36.6 C) (07/29 0814) Pulse Rate:  [76-90] 87 (07/29 0814) Resp:  [15-24] 19 (07/29 0814) BP: (98-119)/(66-86) 104/83 (07/29 0814) SpO2:  [89 %-97 %] 96 % (07/29 0814)  General: Alert, cooperative NAD R axilla with 4 cm incision c/d/i, skin glue in place, no cellulitis or drainage.   Pertinent labs and Studies: Recent Labs    12/20/19 0442 12/19/2019 1241 12/22/19 0401  WBC 4.8 5.5 5.3  HGB 7.7* 8.5* 8.3*  HCT 24.4* 27.0* 26.5*   BMET Recent Labs    12/14/2019 1241 12/22/19 0401  NA 134* 138  K 4.4 4.7  CL 95* 99  CO2 25 23  GLUCOSE 132* 105*  BUN 17 25*  CREATININE 0.80 0.83  CALCIUM 10.7* 10.8*   No results for input(s): LABURIN in the last  72 hours. Results for orders placed or performed during the hospital encounter of 12/07/2019  SARS Coronavirus 2 by RT PCR (hospital order, performed in [facility] hospital lab) Nasopharyngeal Nasopharyngeal Swab     Status: None   Collection Time: 11/30/2019  3:08 PM   Specimen: Nasopharyngeal Swab  Result Value Ref Range Status   SARS Coronavirus 2 NEGATIVE NEGATIVE Final    Comment: (NOTE) SARS-CoV-2 target nucleic acids are NOT DETECTED.  The SARS-CoV-2 RNA is generally detectable in upper and lower respiratory specimens during the acute phase of infection. The lowest concentration of SARS-CoV-2 viral copies this assay can detect is 250 copies / mL. A negative result does not preclude SARS-CoV-2 infection and should not be used as the sole basis for treatment or other patient management decisions.  A negative result may occur with improper specimen collection / handling, submission of specimen other than nasopharyngeal swab, presence of viral mutation(s) within the areas targeted by this assay, and inadequate number of viral copies (<250 copies / mL). A negative result must be combined with clinical observations, patient history, and epidemiological information.  Fact Sheet for Patients:   StrictlyIdeas.no  Fact Sheet for Healthcare Providers: BankingDealers.co.za  This test is not yet approved or  cleared by the [place]  FDA and has been authorized for detection and/or diagnosis of SARS-CoV-2 by FDA under an Emergency Use Authorization (EUA).  This EUA will remain in effect (meaning this test can be used) for the duration of the COVID-19 declaration under Section 564(b)(1) of the Act, 21 U.S.C. section 360bbb-3(b)(1), unless the authorization is terminated or revoked sooner.  Performed at [facility], [place] [address], [place], [place] [address]   MRSA PCR Screening     Status: None   Collection Time: 12/18/19 10:25 PM    Specimen: Nasal Mucosa; Nasopharyngeal  Result Value Ref Range Status   MRSA by PCR NEGATIVE NEGATIVE Final    Comment:        The GeneXpert MRSA Assay (FDA approved for NASAL specimens only), is one component of a comprehensive MRSA colonization surveillance program. It is not intended to diagnose MRSA infection nor to guide or monitor treatment for MRSA infections. Performed at [facility], [place] [address] [place], [place] [address]     Imaging: No results found.

## 2019-12-22 NOTE — Progress Notes (Signed)
  Speech Language Pathology Treatment: Dysphagia  Patient Details Name: Mike Holland MRN: [id number] DOB: 04-27-1966 Today's Date: 12/22/2019 Time: 5859-2924 SLP Time Calculation (min) (ACUTE ONLY): 10 min  Assessment / Plan / Recommendation Clinical Impression  Pt drank a large volume of thin liquids over sequential straw sips with no overt s/s of aspiration - only mild belching. He says that he prefers the chopped diet, no longer having the sensation of residue, but he declines soft/chopped textures offered by SLP in favor of more pureed texture. With purees he has minimal oral residue that clears as he continues to swallow. Education was provided by SLP about current diet and POC, with plan to remain on current diet for now. SLP will f/u for tolerance and potential for any advancements.    HPI HPI: Mike Holland is a 54 y.o. male with medical history significant of HIV-diagnosed in 1996, R CVA, hypertension, depression, hyperlipidemia, ? tachybradycardia syndrome.  Patient presented to the ED with slurred speech and shuffling gait for 3 days.  Head CT was negative for acute abnormality, MRI pending.  CXR reported: "Unusual findings in the right hemithorax concerning for potential malignancy."        SLP Plan  Continue with current plan of care       Recommendations  Diet recommendations: Dysphagia 2 (fine chop);Thin liquid Liquids provided via: Cup;Straw Medication Administration: Whole meds with liquid Supervision: Patient able to self feed;Intermittent supervision to cue for compensatory strategies Compensations: Slow rate;Small sips/bites Postural Changes and/or Swallow Maneuvers: Seated upright 90 degrees                Oral Care Recommendations: Oral care BID Follow up Recommendations: Skilled Nursing facility SLP Visit Diagnosis: Dysphagia, oral phase (R13.11) Plan: Continue with current plan of care       GO                Osie Bond., M.A. [place] [facility] Pager [phone] Office [phone]  12/22/2019, 11:29 AM

## 2019-12-22 NOTE — Progress Notes (Signed)
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  54 year old male with past medical history for HIV 1996, history of CVA, hypertension, depression, dyslipidemia and tachybrady syndrome status post pacemaker.   Patient reported 3 days of slurred speech and shuffling gait.  Positive weight loss 20 pounds in 1 month, generalized weakness and decreased appetite.  On his initial physical examination blood pressure 115/74, heart rate 105, respiratory rate 16, temperature 98.3, oxygen saturation 98%, his lungs were clear to auscultation bilaterally, heart S1-S2, present rhythmic, abdomen soft, no lower extremity edema.  Patient had decreased strength 4/5 in lower extremities. Sodium 134, potassium 2.7, chloride 93, bicarb 24, glucose 113, BUN 70, creatinine 0.71, AST 29, ALT 13, white count 12.1, hemoglobin 9.9, hematocrit 31.3, platelets 156.  SARS COVID-19 negative.  Patient was placed in frequent neuro checks and neurology was consulted.  Further work-up with brain MRI which showed suboptimal evaluation due to significant artifact.  Abnormal marrow signal which may reflect hematopoietic marrow or lymphomatous involvement.   CT chest and abdomen with widespread neoplastic involvement, considerable retroperitoneal adenopathy, enhancing pleural-based lesions throughout the right chest.  Surgery was consulted and patient underwent excisional biopsy right axillary deep lymph node on July 28.    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 Patient with improved mentation but continue to be very weak and deconditioned. Pathology still pending. PT and OT have recommended SNF.  Lumbar spine MRI with diffuse osseous metastatic disease throughout the entire spine and pelvis. Compression fractures L4 and L5. 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   CT with extensive retroperitoneal lymphadenopathy.  Will continue pain control. Follow on oncology recommendations after biopsy result.   2. Acute hypoxic respiratory failure/ positive right pleural based lesions, right lung cystic changes, right 7th rib pathologic fracture. Oxygenation has improved with airway clearing techniques, today oxygen saturation is 97 % on room air.  3. Hypokalemia and hypomagnesemia. Electrolytes have been corrected, with K at 4,7, Mg 1,9. Renal function stable with serum cr at 0,83 and bicarbonate at 23.   4. Severe calorie protein malnutrition/ chronic anemia. Hgb today at 8.3 and hct at 26,5.  Will continue nutritional supplements. Phosphate is 4,9.   5. HIV with oral candidiasis. On antiretroviral therapy with BIKTARVY  6. Depression and bipolar. Patient with agitation, on risperidone.   7. Tachy-brady syndrome sp pacer.  tolerating well metoprolol. Will discontinue telemetry monitoring.   8. HTN/ dyslipidemia and pre-diabetes. On amlodipine and metoprolol for blood pressure control  On statin therapy. Insulin has been discontinued    Patient continue to be at high risk for   Status is: Inpatient  Remains inpatient appropriate because:Inpatient level of care appropriate due to severity of illness   Dispo: The patient is from:  Home               Anticipated d/c is to: SNF              Anticipated d/c date is: 3 days              Patient currently is not medically stable to d/c.   I spoke over the phone with the patient's sister about patient's  condition, plan of care, prognosis and all questions were addressed.   DVT prophylaxis: Enoxaparin   Code Status:   dnr   Family Communication:       Nutrition Status: Nutrition Problem: Severe Malnutrition Etiology: chronic illness (HIV) Signs/Symptoms: percent weight loss, severe muscle depletion Percent weight loss: 9.7 % (1 month) Interventions: Ensure Enlive (each supplement provides 350kcal and 20 grams of protein), Magic cup, Prostat     Skin Documentation:     Consultants:   Surgery   Neurology   Procedures:   Right axillary lymph node biopsy   Antimicrobials:       Subjective: Patient feeling very weak and deconditioned, mild to moderate dyspnea, no chest pain, no nausea or vomiting.   Objective: Vitals:   12/22/19 0330 12/22/19 0400 12/22/19 0814 12/22/19 1204  BP: 100/73 (!) 112/86 104/83 106/80  Pulse:   87 94  Resp: 16 15 (!) 25 (!) 25  Temp: 97.8 F (36.6 C) 98.1 F (36.7 C) 97.8 F (36.6 C) 97.9 F (36.6 C)  TempSrc: Oral Oral Oral Oral  SpO2:   96% 100%  Weight:      Height:        Intake/Output Summary (Last 24 hours) at 12/22/2019 1526 Last data filed at 12/22/2019 0900 Gross per 24 hour  Intake 300 ml  Output 800 ml  Net -500 ml   Filed Weights   12/20/19 0500 12/20/19 1011 12/12/2019 0458  Weight: 59.4 kg 59.4 kg 59.8 kg    Examination:   General: deconditioned and ill looking appearing Neurology: Awake and alert, non focal  E ENT: positive pallor, no icterus, oral mucosa moist Cardiovascular: No JVD. S1-S2 present, rhythmic, no gallops, rubs, or murmurs. No lower extremity edema. Pulmonary: positive breath sounds bilaterally, adequate air movement, no wheezing, rhonchi or rales. Gastrointestinal. Abdomen soft and non  tender Skin. No rashes Musculoskeletal: no joint deformities     Data Reviewed: I have personally reviewed following labs and imaging studies  CBC: Recent Labs  Lab 12/18/19 0349 12/19/19 0516 12/20/19 0442 11/28/2019 1241 12/22/19 0401  WBC 6.9 5.6 4.8 5.5 5.3  NEUTROABS 4.3 3.0 2.4 3.4 2.9  HGB 8.8* 8.5* 7.7* 8.5* 8.3*  HCT 27.2* 26.1* 24.4* 27.0* 26.5*  MCV 92.2 92.2 92.8 91.8 92.7  PLT 101* 70* 58* 55* 52*   Basic Metabolic Panel: Recent Labs  Lab 12/17/19 0536 12/17/19 0536 12/18/19 0349 12/19/19 0516 12/20/19 0442 11/25/2019 1241 12/22/19 0401  NA 137   < > 136 136 135 134* 138  K 3.0*   < > 4.3 4.2 4.2 4.4 4.7  CL 95*   < > 98 96* 96* 95* 99  CO2 26   < > 25 24 25 25 23   GLUCOSE 68*   < > 128* 101* 94 132* 105*  BUN 6   < > 7 9 15 17  25*  CREATININE 0.70   < > 0.61 0.74 0.69 0.80 0.83  CALCIUM 10.0   < > 10.2 10.3 10.2 10.7* 10.8*  MG 1.6*  --  1.7 1.5* 1.9  1.6*  --   PHOS  --   --  2.2* 3.5 3.9 5.1* 4.9*   < > = values in this interval not displayed.   GFR: Estimated Creatinine Clearance: 87.1 mL/min (by C-G formula based on SCr of 0.83 mg/dL). Liver Function Tests: Recent Labs  Lab 12/17/19 0536 12/18/19 0349 12/19/19 0516 12/20/19 0442 11/25/2019 1241  AST 26 34 39 27 34  ALT 12 13 16 14 17   ALKPHOS 159* 156* 169* 158* 171*  BILITOT 1.5* 0.8 0.8 0.4 0.7  PROT 6.2* 6.1* 6.0* 5.6* 6.2*  ALBUMIN 2.0* 2.5* 2.1* 1.9* 1.9*   No results for input(s): LIPASE, AMYLASE in the last 168 hours. No results for input(s): AMMONIA in the last 168 hours. Coagulation Profile: Recent Labs  Lab 11/27/2019 1135  INR 1.4*   Cardiac Enzymes: No results for input(s): CKTOTAL, CKMB, CKMBINDEX, TROPONINI in the last 168 hours. BNP (last 3 results) No results for input(s): PROBNP in the last 8760 hours. HbA1C: No results for input(s): HGBA1C in the last 72 hours. CBG: Recent Labs  Lab 12/22/2019 1528 12/22/19 0019 12/22/19 0425 12/22/19 0812 12/22/19 1238    GLUCAP 135* 122* 101* 147* 186*   Lipid Profile: No results for input(s): CHOL, HDL, LDLCALC, TRIG, CHOLHDL, LDLDIRECT in the last 72 hours. Thyroid Function Tests: No results for input(s): TSH, T4TOTAL, FREET4, T3FREE, THYROIDAB in the last 72 hours. Anemia Panel: No results for input(s): VITAMINB12, FOLATE, FERRITIN, TIBC, IRON, RETICCTPCT in the last 72 hours.    Radiology Studies: I have reviewed all of the imaging during this hospital visit personally     Scheduled Meds: . (feeding supplement) PROSource Plus  30 mL Oral BID BM  . amLODipine  10 mg Oral Daily  . atorvastatin  20 mg Oral Daily  . bictegravir-emtricitabine-tenofovir AF  1 tablet Oral Daily  . dronabinol  5 mg Oral BID AC  . enoxaparin (LOVENOX) injection  40 mg Subcutaneous Q24H  . feeding supplement (ENSURE ENLIVE)  237 mL Oral TID BM  . folic acid  1 mg Oral QPM  . metoprolol succinate  37.5 mg Oral QPM  . nystatin  5 mL Oral QID  . polyethylene glycol  17 g Oral Daily  . risperiDONE  2 mg Oral QHS  . sodium chloride flush  10-40 mL Intracatheter Q12H   Continuous Infusions:   LOS: 6 days        Lela Murfin Gerome Apley, MD

## 2019-12-22 NOTE — Progress Notes (Signed)
Occupational Therapy Treatment Patient Details Name: Mike Holland MRN: [id number] DOB: [birth year] Today's Date: 12/22/2019    History of present illness Pt is a 54yo male with PMH: HIV, GYN, stroke, pacemaker and depression who was admitted on 12/18/2019 with slurred speech and shuffling gaitx3days. Pt with report of a 20 pound weight loss in the last 2 weeks. Imaging showed pleural-based lesions t/o R chest and various lesions around ribs including pathologic fractures a R 7th rib indicitive of a malignant neoplastic event work up underway to for lymphoma   OT comments  Patient continues to make steady progress towards goals in skilled OT session. Patient's session encompassed basic transfers and ADLs in order to promote increased activity tolerance to complete functional tasks. Pt continues to decline in ability in therapy sessions, as pt is now max A in order to complete stand pivot transfers to [facility]. Pt noted a RR of 36 when attempting to use BSC, with increased cues needed for pursed lip breathing, (RR remained in upper 20s once prompted for breathing). Pt fatigued quickly on BSC, requiring total A for peri-care and the recliner to be brought up behind him due to inability to stand pivot. Discharge remains appropriate at this time, will continue to follow acutely.    Follow Up Recommendations  SNF;Supervision/Assistance - 24 hour    Equipment Recommendations  3 in 1 bedside commode;Tub/shower bench;Hospital bed    Recommendations for Other Services      Precautions / Restrictions Precautions Precautions: Fall       Mobility Bed Mobility Overal bed mobility: Needs Assistance Bed Mobility: Supine to Sit     Supine to sit: Mod assist     General bed mobility comments: increased time, HOB elevated  Transfers Overall transfer level: Needs assistance Equipment used: Rolling walker (2 wheeled) Transfers: Sit to/from [facility] Sit to Stand: Max assist Stand  pivot transfers: Max assist       General transfer comment: Pt with need for signifcant ned for external assistance to date, successfully stand pivoting x1, however after sitting on BSC unable to pivot safely, therefore BSC brought to him    Balance Overall balance assessment: Needs assistance Sitting-balance support: No upper extremity supported;Feet supported Sitting balance-Leahy Scale: Fair     Standing balance support: Bilateral upper extremity supported Standing balance-Leahy Scale: Poor Standing balance comment: dependent on UE support                           ADL either performed or assessed with clinical judgement   ADL Overall ADL's : Needs assistance/impaired                         Toilet Transfer: Stand-pivot;BSC;RW;Maximal assistance Toilet Transfer Details (indicate cue type and reason): Significant assist to power up from elevated sureface Toileting- Clothing Manipulation and Hygiene: Total assistance;Sit to/from stand Toileting - Clothing Manipulation Details (indicate cue type and reason): Unable to complete peri-care due to signifcant effort to remain upirght and balanced with walker     Functional mobility during ADLs: Maximal assistance;Cueing for safety;Cueing for sequencing;Rolling walker General ADL Comments: Pt continues to demonstrate fatigue with minimal effort and movement, RR increasing to 36 when on BSC, and requiring chair to be brought to him due to increased weakness (unable to pivot safely off BSC)     Vision       Perception     Praxis  Cognition Arousal/Alertness: Awake/alert Behavior During Therapy: Flat affect Overall Cognitive Status: Impaired/Different from baseline Area of Impairment: Problem solving                             Problem Solving: Requires verbal cues;Requires tactile cues;Difficulty sequencing;Slow processing General Comments: Pt oriented, and able to follow one step commands,  but required increased time to follow        Exercises     Shoulder Instructions       General Comments      Pertinent Vitals/ Pain       Pain Assessment: Faces Faces Pain Scale: Hurts a little bit Pain Location: Generalized Pain Intervention(s): Limited activity within patient's tolerance;Monitored during session;Repositioned  Home Living                                          Prior Functioning/Environment              Frequency  Min 2X/week        Progress Toward Goals  OT Goals(current goals can now be found in the care plan section)  Progress towards OT goals: Progressing toward goals  Acute Rehab OT Goals Patient Stated Goal: to get stronger  OT Goal Formulation: With patient Time For Goal Achievement: 01/01/20 Potential to Achieve Goals: [place] Discharge plan remains appropriate    Co-evaluation                 AM-PAC OT "6 Clicks" Daily Activity     Outcome Measure   Help from another person eating meals?: None Help from another person taking care of personal grooming?: A Little Help from another person toileting, which includes using toliet, bedpan, or urinal?: A Lot Help from another person bathing (including washing, rinsing, drying)?: A Lot Help from another person to put on and taking off regular upper body clothing?: A Lot Help from another person to put on and taking off regular lower body clothing?: Total 6 Click Score: 14    End of Session Equipment Utilized During Treatment: Oxygen;Rolling walker;Gait belt  OT Visit Diagnosis: Muscle weakness (generalized) (M62.81);Unsteadiness on feet (R26.81)   Activity Tolerance Patient limited by lethargy;Patient limited by fatigue   Patient Left in chair;with call bell/phone within reach;with nursing/sitter in room   Nurse Communication Mobility status        Time: 1020-1044 OT Time Calculation (min): 24 min  Charges: OT General Charges $OT Visit: 1  Visit OT Treatments $Self Care/Home Management : 23-37 mins  Elmer. Jamilee Lafosse, COTA/L Acute Rehabilitation Services [place] 12/22/2019, 1:02 PM

## 2019-12-22 NOTE — Progress Notes (Signed)
Physical Therapy Treatment Patient Details Name: Mike Holland MRN: [id number] DOB: 1966-05-10 Today's Date: 12/22/2019    History of Present Illness Pt is a 54yo male with PMH: HIV, GYN, stroke, pacemaker and depression who was admitted on 12/15/2019 with slurred speech and shuffling gaitx3days. Pt with report of a 20 pound weight loss in the last 2 weeks. Imaging showed pleural-based lesions t/o R chest and various lesions around ribs including pathologic fractures a R 7th rib indicitive of a malignant neoplastic event work up underway to for lymphoma    PT Comments    Pt remains limited by fatigue and weakness. Pt tolerates transfer back to bed this session, continuing to require significant physical assistance to do so due to weakness. Pt fatigues quickly with LE exercise at bed-level after mobilizing to bed, with increased work of breathing. PT encourages performance of LE HEP to improve strength and activity tolerance. PT updating recommendations to SNF as pt require more significant assistance since initial eval and is at a high falls risk. Pt reports being agreeable to rehab.  Follow Up Recommendations  SNF (HHPT if pt elects to discharge home)     Equipment Recommendations  None recommended by PT (defer to SNF placement)    Recommendations for Other Services       Precautions / Restrictions Precautions Precautions: Fall Restrictions Weight Bearing Restrictions: No    Mobility  Bed Mobility Overal bed mobility: Needs Assistance Bed Mobility: Supine to Sit Rolling: Supervision   Supine to sit: Mod assist Sit to supine: Min assist   General bed mobility comments: increased time, HOB elevated  Transfers Overall transfer level: Needs assistance Equipment used: Rolling walker (2 wheeled) Transfers: Sit to/from [facility] Sit to Stand: Max assist Stand pivot transfers: Max assist       General transfer comment: Pt with need for signifcant ned for  external assistance to date, successfully stand pivoting x1, however after sitting on BSC unable to pivot safely, therefore BSC brought to him  Ambulation/Gait                 Stairs             Wheelchair Mobility    Modified Rankin (Stroke Patients Only)       Balance Overall balance assessment: Needs assistance Sitting-balance support: No upper extremity supported;Feet supported Sitting balance-Leahy Scale: Fair Sitting balance - Comments: reliant on UE support   Standing balance support: Bilateral upper extremity supported Standing balance-Leahy Scale: Poor Standing balance comment: dependent on UE support                            Cognition Arousal/Alertness: Awake/alert Behavior During Therapy: Flat affect Overall Cognitive Status: Impaired/Different from baseline Area of Impairment: Problem solving                             Problem Solving: Requires verbal cues;Requires tactile cues;Difficulty sequencing;Slow processing General Comments: Pt oriented, and able to follow one step commands, but required increased time to follow      Exercises      General Comments General comments (skin integrity, edema, etc.): pt on 4L [place] during session, RR elevates to 40 with activity, pt does report increased work of breathing      Pertinent Vitals/Pain Pain Assessment: Faces Faces Pain Scale: Hurts a little bit Pain Location: Generalized Pain Descriptors / Indicators: Grimacing Pain  Intervention(s): Limited activity within patient's tolerance;Monitored during session;Repositioned    Home Living                      Prior Function            PT Goals (current goals can now be found in the care plan section) Acute Rehab PT Goals Patient Stated Goal: to get stronger  Progress towards PT goals: Not progressing toward goals - comment (limited by weakness and fatigue)    Frequency    Min 3X/week (maintain 3x/wk due to  deconditioning despite SNF rec)      PT Plan Discharge plan needs to be updated    Co-evaluation              AM-PAC PT "6 Clicks" Mobility   Outcome Measure  Help needed turning from your back to your side while in a flat bed without using bedrails?: A Little Help needed moving from lying on your back to sitting on the side of a flat bed without using bedrails?: A Little Help needed moving to and from a bed to a chair (including a wheelchair)?: A Lot Help needed standing up from a chair using your arms (e.g., wheelchair or bedside chair)?: A Lot Help needed to walk in hospital room?: A Lot Help needed climbing 3-5 steps with a railing? : Total 6 Click Score: 13    End of Session   Activity Tolerance: Patient limited by fatigue Patient left: in bed;with call bell/phone within reach;with bed alarm set Nurse Communication: Mobility status PT Visit Diagnosis: Unsteadiness on feet (R26.81);Difficulty in walking, not elsewhere classified (R26.2)     Time: 6433-2951 PT Time Calculation (min) (ACUTE ONLY): 24 min  Charges:  $Therapeutic Exercise: 8-22 mins $Therapeutic Activity: 8-22 mins                     Zenaida Niece, PT, DPT Acute Rehabilitation Pager: [phone]    Zenaida Niece 12/22/2019, 2:45 PM

## 2019-12-22 NOTE — Progress Notes (Addendum)
Inpatient Diabetes Program Recommendations  AACE/ADA: New Consensus Statement on Inpatient Glycemic Control (2015)  Target Ranges:  Prepandial:   less than 140 mg/dL      Peak postprandial:   less than 180 mg/dL (1-2 hours)      Critically ill patients:  140 - 180 mg/dL   Lab Results  Component Value Date   GLUCAP 186 (H) 12/22/2019   HGBA1C 6.1 (H) 11/27/2019    Review of Glycemic Control Results for [name], Mike Holland (MRN [id number]) as of 12/22/2019 13:26  Ref. Range 12/04/2019 15:28 12/22/2019 00:19 12/22/2019 04:25 12/22/2019 08:12 12/22/2019 12:38  Glucose-Capillary Latest Ref Range: 70 - 99 mg/dL 135 (H) 122 (H) 101 (H) 147 (H) 186 (H)   Diabetes history:  DM2  Outpatient Diabetes medications:  None  Current orders for Inpatient glycemic control:  None at this time  Inpatient Diabetes Program Recommendations:     Patient has a dysphagia II diet ordered.  CBG is now 186 mh/dl.  If CBG's remain > 180 mg/dl, Please consider adding;  Novolog 0-6 units TID with meals  Will continue to follow while inpatient.  Thank you, Reche Dixon, RN, BSN Diabetes Coordinator Inpatient Diabetes Program [phone] (team pager from 8a-5p)

## 2019-12-22 NOTE — Progress Notes (Signed)
Changed device settings for MRI to  ODO per order Will  Program device back to pre-MRI settings after completion of exam.

## 2019-12-23 ENCOUNTER — Inpatient Hospital Stay (HOSPITAL_COMMUNITY): Payer: Medicaid Other

## 2019-12-23 DIAGNOSIS — R531 Weakness: Secondary | ICD-10-CM

## 2019-12-23 LAB — GLUCOSE, CAPILLARY
Glucose-Capillary: 119 mg/dL — ABNORMAL HIGH (ref 70–99)
Glucose-Capillary: 123 mg/dL — ABNORMAL HIGH (ref 70–99)
Glucose-Capillary: 119 mg/dL — ABNORMAL HIGH (ref 70–99)
Glucose-Capillary: 125 mg/dL — ABNORMAL HIGH (ref 70–99)

## 2019-12-23 LAB — PHOSPHORUS: Phosphorus: 3.2 mg/dL (ref 2.5–4.6)

## 2019-12-23 MED ORDER — GADOBUTROL 1 MMOL/ML IV SOLN
6.0000 mL | Freq: Once | INTRAVENOUS | Status: AC | PRN
  Administered 2019-12-23: 6 mL via INTRAVENOUS

## 2019-12-23 NOTE — Progress Notes (Signed)
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  54 year old male with past medical history for HIV 1996, history of CVA, hypertension, depression, dyslipidemia and tachybrady syndrome status post pacemaker.  Patient reported 3 days of slurred speech and shuffling gait. Positive weight loss 20 pounds in 1 month, generalized weakness and decreased appetite. On his initial physical examination blood pressure 115/74, heart rate 105, respiratory rate 16, temperature 98.3, oxygen saturation 98%, his lungs were clear to auscultation bilaterally, heart S1-S2, present rhythmic, abdomen soft, no lower extremity edema. Patient had decreased strength 4/5 in lower extremities. Sodium 134, potassium 2.7, chloride 93, bicarb 24, glucose 113, BUN 70, creatinine 0.71, AST 29, ALT 13, white count 12.1, hemoglobin 9.9, hematocrit 31.3, platelets 156. SARS COVID-19 negative.  Patient was placed in frequent neuro checks and neurology was consulted. Further work-up with brain MRI which showed suboptimal evaluation due to significant artifact. Abnormal marrow signal which may reflect hematopoietic marrow or lymphomatous involvement.  CT chest and abdomen with widespread neoplastic involvement,considerable retroperitoneal adenopathy,enhancing pleural-based lesions throughout the right chest.  Surgery was consulted and patient underwent excisional biopsy right axillarydeeplymph nodeonJuly 28.  Further work up with lumbar spine MRI with diffuse osseous metastatic disease throughout the entire spine and pelvis. Compression fractures L4 and L5. 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     Assessment & Plan:   Principal Problem:   Widespread metastatic malignant neoplastic disease  ([place]) Active Problems:   HIV disease ([place])   Essential hypertension   Depression   Unintentional weight loss   HLD (hyperlipidemia)   Pacemaker   Hypokalemia   Hypomagnesemia   Normocytic anemia   Marijuana abuse   Slurred speech   Palliative care by specialist   Goals of care, counseling/discussion   DNR (do not resuscitate)   Acute respiratory failure with hypoxia ([place])   Protein-calorie malnutrition, severe   1. 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 Lumbar spine MRI with diffuse osseous metastatic disease throughout the entire spine and pelvis. Compression fractures L4 and L5. 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  Patient in agreement for SNF when medically stable. Continue pending pathology report.   CT with extensive retroperitoneal lymphadenopathy.  Today had thoracic MRI for further work up.   2. Acute hypoxic respiratory failure/ positive right pleural based lesions, right lung cystic changes, right 7th rib pathologic fracture. oxygenation is 96 % on room air, respiratory failure has resolved.   3. Hypokalemia and hypomagnesemia. renal function stable and electrolytes have been corrected. P today is 3,2.   Continue with nutritional supplementation.   4. Severe calorie protein malnutrition/ chronic anemia. Stable cell count. Continue nutritional support. Patient will need SNF at discharge.   5. HIV with oral candidiasis. Continue with antiretroviral therapy with BIKTARVY  6. Depression and bipolar.  continue with risperidone.   7. Tachy-brady syndrome sp pacer. On metoprolol. Now off telemetry.   8. HTN/ dyslipidemia and pre-diabetes. Continue with amlodipine and metoprolol for blood pressure control    Status is: Inpatient  Remains inpatient appropriate because:Inpatient level of care appropriate due to severity of illness   Dispo: The patient is from: Home              Anticipated d/c is to: SNF  Anticipated d/c date is: 2 days              Patient currently is not medically stable to d/c.   DVT prophylaxis: Enoxaparin   Code Status:   dnr   Family Communication:  No family at the bedside      Nutrition Status: Nutrition Problem: Severe Malnutrition Etiology: chronic illness (HIV) Signs/Symptoms: percent weight loss, severe muscle depletion Percent weight loss: 9.7 % (1 month) Interventions: Ensure Enlive (each supplement provides 350kcal and 20 grams of protein), Magic cup, Prostat     Consultants:   Neurology     Subjective: Patient continue to be very weak and deconditioned, no nausea or vomiting, continue to have poor oral intake, no chest pain or dyspnea,   Objective: Vitals:   12/23/19 0418 12/23/19 0500 12/23/19 0800 12/23/19 1216  BP: 103/77  118/83 121/77  Pulse: 101  104 (!) 108  Resp:   18 18  Temp: 98.1 F (36.7 C)  98.7 F (37.1 C) 98 F (36.7 C)  TempSrc: Oral  Oral Oral  SpO2: 100%  96% 96%  Weight:  58.9 kg    Height:        Intake/Output Summary (Last 24 hours) at 12/23/2019 1423 Last data filed at 12/23/2019 0900 Gross per 24 hour  Intake 275 ml  Output 1200 ml  Net -925 ml   Filed Weights   12/20/19 1011 12/07/2019 0458 12/23/19 0500  Weight: 59.4 kg 59.8 kg 58.9 kg    Examination:   General: deconditioned  Neurology: Awake and alert, non focal  E ENT: positive pallor, no icterus, oral mucosa moist Cardiovascular: No JVD. S1-S2 present, rhythmic, no gallops, rubs, or murmurs. No lower extremity edema. Pulmonary: positive breath sounds bilaterally, adequate air movement, no wheezing, rhonchi or rales. Gastrointestinal. Abdomen flat, no organomegaly, non tender, no rebound or guarding Skin. No rashes Musculoskeletal: no joint deformities     Data Reviewed: I have personally reviewed following labs and imaging studies  CBC: Recent Labs  Lab 12/18/19 0349 12/19/19 0516 12/20/19 0442 11/24/2019 1241 12/22/19 0401  WBC 6.9  5.6 4.8 5.5 5.3  NEUTROABS 4.3 3.0 2.4 3.4 2.9  HGB 8.8* 8.5* 7.7* 8.5* 8.3*  HCT 27.2* 26.1* 24.4* 27.0* 26.5*  MCV 92.2 92.2 92.8 91.8 92.7  PLT 101* 70* 58* 55* 52*   Basic Metabolic Panel: Recent Labs  Lab 12/17/19 0536 12/17/19 0536 12/18/19 0349 12/18/19 0349 12/19/19 0516 12/20/19 0442 12/08/2019 1241 12/22/19 0401 12/23/19 0406  NA 137   < > 136  --  136 135 134* 138  --   K 3.0*   < > 4.3  --  4.2 4.2 4.4 4.7  --   CL 95*   < > 98  --  96* 96* 95* 99  --   CO2 26   < > 25  --  24 25 25 23   --   GLUCOSE 68*   < > 128*  --  101* 94 132* 105*  --   BUN 6   < > 7  --  9 15 17  25*  --   CREATININE 0.70   < > 0.61  --  0.74 0.69 0.80 0.83  --   CALCIUM 10.0   < > 10.2  --  10.3 10.2 10.7* 10.8*  --   MG 1.6*  --  1.7  --  1.5* 1.9 1.6*  --   --   PHOS  --   --  2.2*   < >  3.5 3.9 5.1* 4.9* 3.2   < > = values in this interval not displayed.   GFR: Estimated Creatinine Clearance: 85.7 mL/min (by C-G formula based on SCr of 0.83 mg/dL). Liver Function Tests: Recent Labs  Lab 12/17/19 0536 12/18/19 0349 12/19/19 0516 12/20/19 0442 12/20/2019 1241  AST 26 34 39 27 34  ALT 12 13 16 14 17   ALKPHOS 159* 156* 169* 158* 171*  BILITOT 1.5* 0.8 0.8 0.4 0.7  PROT 6.2* 6.1* 6.0* 5.6* 6.2*  ALBUMIN 2.0* 2.5* 2.1* 1.9* 1.9*   No results for input(s): LIPASE, AMYLASE in the last 168 hours. No results for input(s): AMMONIA in the last 168 hours. Coagulation Profile: No results for input(s): INR, PROTIME in the last 168 hours. Cardiac Enzymes: No results for input(s): CKTOTAL, CKMB, CKMBINDEX, TROPONINI in the last 168 hours. BNP (last 3 results) No results for input(s): PROBNP in the last 8760 hours. HbA1C: No results for input(s): HGBA1C in the last 72 hours. CBG: Recent Labs  Lab 12/22/19 1551 12/22/19 1952 12/23/19 0025 12/23/19 0758 12/23/19 1223  GLUCAP 202* 161* 119* 123* 119*   Lipid Profile: No results for input(s): CHOL, HDL, LDLCALC, TRIG, CHOLHDL,  LDLDIRECT in the last 72 hours. Thyroid Function Tests: No results for input(s): TSH, T4TOTAL, FREET4, T3FREE, THYROIDAB in the last 72 hours. Anemia Panel: No results for input(s): VITAMINB12, FOLATE, FERRITIN, TIBC, IRON, RETICCTPCT in the last 72 hours.    Radiology Studies: I have reviewed all of the imaging during this hospital visit personally     Scheduled Meds: . (feeding supplement) PROSource Plus  30 mL Oral BID BM  . amLODipine  10 mg Oral Daily  . atorvastatin  20 mg Oral Daily  . bictegravir-emtricitabine-tenofovir AF  1 tablet Oral Daily  . dronabinol  5 mg Oral BID AC  . enoxaparin (LOVENOX) injection  40 mg Subcutaneous Q24H  . feeding supplement (ENSURE ENLIVE)  237 mL Oral TID BM  . folic acid  1 mg Oral QPM  . metoprolol succinate  37.5 mg Oral QPM  . nystatin  5 mL Oral QID  . polyethylene glycol  17 g Oral Daily  . risperiDONE  2 mg Oral QHS  . sodium chloride flush  10-40 mL Intracatheter Q12H   Continuous Infusions:   LOS: 7 days        Tristyn Pharris Gerome Apley, MD

## 2019-12-23 NOTE — Plan of Care (Signed)

## 2019-12-23 NOTE — Progress Notes (Signed)
Inpatient Diabetes Program Recommendations  AACE/ADA: New Consensus Statement on Inpatient Glycemic Control (2015)  Target Ranges:  Prepandial:   less than 140 mg/dL      Peak postprandial:   less than 180 mg/dL (1-2 hours)      Critically ill patients:  140 - 180 mg/dL   Lab Results  Component Value Date   GLUCAP 119 (H) 12/23/2019   HGBA1C 6.1 (H) 11/30/2019    Review of Glycemic Control Results for Mike Holland, Mike Holland (MRN [id number]) as of 12/23/2019 12:32  Ref. Range 12/22/2019 19:52 12/23/2019 00:25 12/23/2019 07:58 12/23/2019 12:23  Glucose-Capillary Latest Ref Range: 70 - 99 mg/dL 161 (H) 119 (H) 123 (H) 119 (H)   Diabetes history:  Pre-DM2 Outpatient Diabetes medications:  None Current orders for Inpatient glycemic control:  None  Note:  Spoke with patient at bedside regarding diagnoses of pre-DM.  Spoke with pt about new diagnosis. Discussed A1C results with he and explained what an A1C is, basic pathophysiology of DM Type 2, basic home care, basic diabetes diet nutrition principles. RNs to provide ongoing basic DM education at bedside with this patient. Have ordered educational booklet and RD consult for diet education.    Patient does drink regular soft drinks and does not watch his CHO intake.  We reviewed CHO's and foods that contain CHO's.    He is current with PCP, Mauricio Po.  Encourage close follow up with him for assistance with DM management.     Will continue to follow while inpatient.  Thank you, Reche Dixon, RN, BSN Diabetes Coordinator Inpatient Diabetes Program [phone] (team pager from 8a-5p)

## 2019-12-23 NOTE — Progress Notes (Signed)
Nutrition Follow-up  DOCUMENTATION CODES:   Severe malnutrition in context of chronic illness  INTERVENTION:  Provide Ensure Enlive po TID, each supplement provides 350 kcal and 20 grams of protein.  Provide 30 ml Prosource plus po BID, each supplement provides 100 kcal and 15 grams of protein.   Encourage adequate PO intake.   Diet handout regarding Pre-Diabetes placed at pt bedside.   NUTRITION DIAGNOSIS:   Severe Malnutrition related to chronic illness (HIV) as evidenced by percent weight loss, severe muscle depletion; ongoing  GOAL:   Patient will meet greater than or equal to 90% of their needsl progressing  MONITOR:   PO intake, Supplement acceptance, Weight trends, Labs, I & O's  REASON FOR ASSESSMENT:   Consult Assessment of nutrition requirement/status  ASSESSMENT:   Pt with widespread metastatic malignant neoplastic disease presented with slurred speech and shuffling gait x3 days and generalized weakness, poor appetite, and wt loss x1 month. 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 Per Surgery, pt with widespread lymphadenopathy, degenerative changesinlumbar spine and right iliac,Right seventh rib pathologic fracture,Pathologic changes right4th,7th and 9th ribs,Bilateral axillary lymphadenopathy.  7/28: s/p excisional Biopsy Right Deep Axillary Lymph Node.  Meal completion has been varied from 10-90% with 90% intake at breakfast this AM. Pt reports appetite is fine and he has been consuming his food at meals. Pt currently has Ensure and Prosource Plus ordered and has been consuming them. RD to continue with current orders to aid in caloric and protein needs. RD consulted for diet education regarding pre-diabetes. Pt currently not appropriate for diet education. Diet handout placed at pt bedside. Labs and medications reviewed.   Diet Order:   Diet Order            DIET  DYS 2 Room service appropriate? Yes; Fluid consistency: Thin  Diet effective now                 EDUCATION NEEDS:   No education needs have been identified at this time  Skin:  Skin Assessment: Skin Integrity Issues: Skin Integrity Issues:: Incisions Incisions: R axilla  Last BM:  7/29  Height:   Ht Readings from Last 1 Encounters:  12/20/19 5\' 7"  (1.702 m)    Weight:   Wt Readings from Last 1 Encounters:  12/23/19 58.9 kg    BMI:  Body mass index is 20.34 kg/m.  Estimated Nutritional Needs:   Kcal:  2000-2200  Protein:  100-120 grams  Fluid:  >/= 2L/d  Corrin Parker, MS, RD, LDN RD pager number/after hours weekend pager number on Amion.

## 2019-12-23 NOTE — Progress Notes (Signed)
Changed device settings for MRI to  ODO per order Will Program device back to pre-MRI settings after completion of exam

## 2019-12-23 NOTE — Progress Notes (Addendum)
Subjective: Alert and Oriented, sitting up in bed. Reports today that his legs are "too weak" and he is unable to lift them off the bed.   Objective: Current vital signs: BP 118/83 (BP Location: Right Arm)   Pulse 104   Temp 98.7 F (37.1 C) (Oral)   Resp 18   Ht 5\' 7"  (1.702 m)   Wt 58.9 kg   SpO2 96%   BMI 20.34 kg/m  Vital signs in last 24 hours: Temp:  [97.5 F (36.4 C)-98.7 F (37.1 C)] 98.7 F (37.1 C) (07/30 0800) Pulse Rate:  [86-104] 104 (07/30 0800) Resp:  [18-25] 18 (07/30 0800) BP: (99-118)/(74-83) 118/83 (07/30 0800) SpO2:  [91 %-100 %] 96 % (07/30 0800) Weight:  [58.9 kg] 58.9 kg (07/30 0500)  Intake/Output from previous day: 07/29 0701 - 07/30 0700 In: 300 [P.O.:300] Out: 1200 [Urine:1200] Intake/Output this shift: No intake/output data recorded. Nutritional status:  Diet Order            DIET DYS 2 Room service appropriate? Yes; Fluid consistency: Thin  Diet effective now                 Neurologic Exam: Mental Status: Alert, oriented, thought content appropriate.  Speech fluent without evidence of aphasia.  Able to follow 3 step commands without difficulty. Cranial Nerves: II: Visual fields grossly normal,  III,IV, VI: ptosis not present, extra-ocular motions intact bilaterally  V,VII: smile symmetric, facial light touch sensation normal bilaterally VIII: hearing normal bilaterally IX,X: uvula rises symmetrically XI: bilateral shoulder shrug XII: midline tongue extension Motor: Upper extremities   5/5     Lower extremities: Unable to lift to gravity off bed. Minimal resistance when attempting strength testing in bilateral lower extremities. Unclear if partially effort dependent. Stated his legs are just too weak today. 5/5 ankle dorsiflexors/plantor flexors. Tone and bulk:normal tone throughout; no atrophy noted Sensory: light touch intact throughout, bilaterally Cerebellar:  normal finger-to-nose, unable to participate in heel to shin    Lab Results: Results for orders placed or performed during the hospital encounter of 12/12/2019 (from the past 48 hour(s))  Glucose, capillary     Status: Abnormal   Collection Time: 12/18/2019 11:45 AM  Result Value Ref Range   Glucose-Capillary 104 (H) 70 - 99 mg/dL    Comment: Glucose reference range applies only to samples taken after fasting for at least 8 hours.  D-dimer, quantitative (not at [facility])     Status: Abnormal   Collection Time: 11/26/2019 12:41 PM  Result Value Ref Range   D-Dimer, Quant 7.71 (H) 0.00 - 0.50 ug/mL-FEU    Comment: (NOTE) At the manufacturer cut-off of 0.50 ug/mL FEU, this assay has been documented to exclude PE with a sensitivity and negative predictive value of 97 to 99%.  At this time, this assay has not been approved by the FDA to exclude DVT/VTE. Results should be correlated with clinical presentation. Performed at [facility], [place] [address] [place], [place] [address]   CBC with Differential/Platelet     Status: Abnormal   Collection Time: 12/21/19 12:41 PM  Result Value Ref Range   WBC 5.5 4.0 - 10.5 K/uL   RBC 2.94 (L) 4.22 - 5.81 MIL/uL   Hemoglobin 8.5 (L) 13.0 - 17.0 g/dL   HCT 27.0 (L) 39 - 52 %   MCV 91.8 80.0 - 100.0 fL   MCH 28.9 26.0 - 34.0 pg   MCHC 31.5 30.0 - 36.0 g/dL   RDW 15.0 11.5 -  15.5 %   Platelets 55 (L) 150 - 400 K/uL    Comment: REPEATED TO VERIFY Immature Platelet Fraction may be clinically indicated, consider ordering this additional test [id number] CONSISTENT WITH PREVIOUS RESULT    nRBC 1.4 (H) 0.0 - 0.2 %   Neutrophils Relative % 61 %   Neutro Abs 3.4 1.7 - 7.7 K/uL   Lymphocytes Relative 15 %   Lymphs Abs 0.8 0.7 - 4.0 K/uL   Monocytes Relative 5 %   Monocytes Absolute 0.3 0 - 1 K/uL   Eosinophils Relative 1 %   Eosinophils Absolute 0.1 0 - 0 K/uL   Basophils Relative 1 %   Basophils Absolute 0.1 0 - 0 K/uL   WBC Morphology See Note     Comment: Moderate Left Shift. >5% Metas and Myelos, Occ Pro  Noted.   Immature Granulocytes 17 %   Abs Immature Granulocytes 0.93 (H) 0.00 - 0.07 K/uL    Comment: Performed at [facility], [place] [address] [place], [place] [address]  Comprehensive metabolic panel     Status: Abnormal   Collection Time: 11/29/2019 12:41 PM  Result Value Ref Range   Sodium 134 (L) 135 - 145 mmol/L   Potassium 4.4 3.5 - 5.1 mmol/L   Chloride 95 (L) 98 - 111 mmol/L   CO2 25 22 - 32 mmol/L   Glucose, Bld 132 (H) 70 - 99 mg/dL    Comment: Glucose reference range applies only to samples taken after fasting for at least 8 hours.   BUN 17 6 - 20 mg/dL   Creatinine, Ser 0.80 0.61 - 1.24 mg/dL   Calcium 10.7 (H) 8.9 - 10.3 mg/dL   Total Protein 6.2 (L) 6.5 - 8.1 g/dL   Albumin 1.9 (L) 3.5 - 5.0 g/dL   AST 34 15 - 41 U/L   ALT 17 0 - 44 U/L   Alkaline Phosphatase 171 (H) 38 - 126 U/L   Total Bilirubin 0.7 0.3 - 1.2 mg/dL   GFR calc non Af Amer >60 >60 mL/min   GFR calc Af Amer >60 >60 mL/min   Anion gap 14 5 - 15    Comment: Performed at [facility] [address], [place], [place] [address]  Magnesium     Status: Abnormal   Collection Time: 12/06/2019 12:41 PM  Result Value Ref Range   Magnesium 1.6 (L) 1.7 - 2.4 mg/dL    Comment: Performed at [facility] [address] [place], [place] [address]  Phosphorus     Status: Abnormal   Collection Time: 11/29/2019 12:41 PM  Result Value Ref Range   Phosphorus 5.1 (H) 2.5 - 4.6 mg/dL    Comment: Performed at [facility] [address] [place], [place] [address]  Glucose, capillary     Status: Abnormal   Collection Time: 12/06/2019  3:28 PM  Result Value Ref Range   Glucose-Capillary 135 (H) 70 - 99 mg/dL    Comment: Glucose reference range applies only to samples taken after fasting for at least 8 hours.  Glucose, capillary     Status: Abnormal   Collection Time: 12/22/19 12:19 AM  Result Value Ref Range   Glucose-Capillary 122 (H) 70 - 99 mg/dL    Comment: Glucose reference range applies  only to samples taken after fasting for at least 8 hours.  Phosphorus     Status: Abnormal   Collection Time: 12/22/19  4:01 AM  Result Value Ref Range  Phosphorus 4.9 (H) 2.5 - 4.6 mg/dL    Comment: Performed at [facility], [place] [address] [place], [place] [address]  CBC with Differential/Platelet     Status: Abnormal   Collection Time: 12/22/19  4:01 AM  Result Value Ref Range   WBC 5.3 4.0 - 10.5 K/uL   RBC 2.86 (L) 4.22 - 5.81 MIL/uL   Hemoglobin 8.3 (L) 13.0 - 17.0 g/dL   HCT 26.5 (L) 39 - 52 %   MCV 92.7 80.0 - 100.0 fL   MCH 29.0 26.0 - 34.0 pg   MCHC 31.3 30.0 - 36.0 g/dL   RDW 15.1 11.5 - 15.5 %   Platelets 52 (L) 150 - 400 K/uL    Comment: REPEATED TO VERIFY Immature Platelet Fraction may be clinically indicated, consider ordering this additional test [id number] CONSISTENT WITH PREVIOUS RESULT    nRBC 2.5 (H) 0.0 - 0.2 %   Neutrophils Relative % 54 %   Neutro Abs 2.9 1.7 - 7.7 K/uL   Lymphocytes Relative 18 %   Lymphs Abs 1.0 0.7 - 4.0 K/uL   Monocytes Relative 5 %   Monocytes Absolute 0.2 0 - 1 K/uL   Eosinophils Relative 1 %   Eosinophils Absolute 0.0 0 - 0 K/uL   Basophils Relative 1 %   Basophils Absolute 0.1 0 - 0 K/uL   WBC Morphology      MODERATE LEFT SHIFT (>5% METAS AND MYELOS,OCC PRO NOTED)   Immature Granulocytes 21 %   Abs Immature Granulocytes 1.12 (H) 0.00 - 0.07 K/uL    Comment: Performed at [facility], [place] [address] [place], [place] [address]  Basic metabolic panel     Status: Abnormal   Collection Time: 12/22/19  4:01 AM  Result Value Ref Range   Sodium 138 135 - 145 mmol/L   Potassium 4.7 3.5 - 5.1 mmol/L   Chloride 99 98 - 111 mmol/L   CO2 23 22 - 32 mmol/L   Glucose, Bld 105 (H) 70 - 99 mg/dL    Comment: Glucose reference range applies only to samples taken after fasting for at least 8 hours.   BUN 25 (H) 6 - 20 mg/dL   Creatinine, Ser 0.83 0.61 - 1.24 mg/dL   Calcium 10.8 (H) 8.9 - 10.3 mg/dL   GFR calc non Af Amer  >60 >60 mL/min   GFR calc Af Amer >60 >60 mL/min   Anion gap 16 (H) 5 - 15    Comment: Performed at [facility] [address] [place], [place] [address]  Glucose, capillary     Status: Abnormal   Collection Time: 12/22/19  4:25 AM  Result Value Ref Range   Glucose-Capillary 101 (H) 70 - 99 mg/dL    Comment: Glucose reference range applies only to samples taken after fasting for at least 8 hours.  Glucose, capillary     Status: Abnormal   Collection Time: 12/22/19  8:12 AM  Result Value Ref Range   Glucose-Capillary 147 (H) 70 - 99 mg/dL    Comment: Glucose reference range applies only to samples taken after fasting for at least 8 hours.  Glucose, capillary     Status: Abnormal   Collection Time: 12/22/19 12:38 PM  Result Value Ref Range   Glucose-Capillary 186 (H) 70 - 99 mg/dL    Comment: Glucose reference range applies only to samples taken after fasting for at least 8 hours.  Glucose, capillary     Status: Abnormal  Collection Time: 12/22/19  3:51 PM  Result Value Ref Range   Glucose-Capillary 202 (H) 70 - 99 mg/dL    Comment: Glucose reference range applies only to samples taken after fasting for at least 8 hours.  Glucose, capillary     Status: Abnormal   Collection Time: 12/22/19  7:52 PM  Result Value Ref Range   Glucose-Capillary 161 (H) 70 - 99 mg/dL    Comment: Glucose reference range applies only to samples taken after fasting for at least 8 hours.  Glucose, capillary     Status: Abnormal   Collection Time: 12/23/19 12:25 AM  Result Value Ref Range   Glucose-Capillary 119 (H) 70 - 99 mg/dL    Comment: Glucose reference range applies only to samples taken after fasting for at least 8 hours.  Phosphorus     Status: None   Collection Time: 12/23/19  4:06 AM  Result Value Ref Range   Phosphorus 3.2 2.5 - 4.6 mg/dL    Comment: Performed at [facility] [address], [place], [place] [address]  Glucose, capillary     Status: Abnormal   Collection Time:  12/23/19  7:58 AM  Result Value Ref Range   Glucose-Capillary 123 (H) 70 - 99 mg/dL    Comment: Glucose reference range applies only to samples taken after fasting for at least 8 hours.    Recent Results (from the past 240 hour(s))  SARS Coronavirus 2 by RT PCR (hospital order, performed in [facility] hospital lab) Nasopharyngeal Nasopharyngeal Swab     Status: None   Collection Time: 12/19/2019  3:08 PM   Specimen: Nasopharyngeal Swab  Result Value Ref Range Status   SARS Coronavirus 2 NEGATIVE NEGATIVE Final    Comment: (NOTE) SARS-CoV-2 target nucleic acids are NOT DETECTED.  The SARS-CoV-2 RNA is generally detectable in upper and lower respiratory specimens during the acute phase of infection. The lowest concentration of SARS-CoV-2 viral copies this assay can detect is 250 copies / mL. A negative result does not preclude SARS-CoV-2 infection and should not be used as the sole basis for treatment or other patient management decisions.  A negative result may occur with improper specimen collection / handling, submission of specimen other than nasopharyngeal swab, presence of viral mutation(s) within the areas targeted by this assay, and inadequate number of viral copies (<250 copies / mL). A negative result must be combined with clinical observations, patient history, and epidemiological information.  Fact Sheet for Patients:   StrictlyIdeas.no  Fact Sheet for Healthcare Providers: BankingDealers.co.za  This test is not yet approved or  cleared by the [place] FDA and has been authorized for detection and/or diagnosis of SARS-CoV-2 by FDA under an Emergency Use Authorization (EUA).  This EUA will remain in effect (meaning this test can be used) for the duration of the COVID-19 declaration under Section 564(b)(1) of the Act, 21 U.S.C. section 360bbb-3(b)(1), unless the authorization is terminated or revoked sooner.  Performed  at [facility], [place] [address] [place], [place] [address]   MRSA PCR Screening     Status: None   Collection Time: 12/18/19 10:25 PM   Specimen: Nasal Mucosa; Nasopharyngeal  Result Value Ref Range Status   MRSA by PCR NEGATIVE NEGATIVE Final    Comment:        The GeneXpert MRSA Assay (FDA approved for NASAL specimens only), is one component of a comprehensive MRSA colonization surveillance program. It is not intended to diagnose MRSA infection nor  to guide or monitor treatment for MRSA infections. Performed at [facility], [place] [address], [place], [place] [address]     Lipid Panel No results for input(s): CHOL, TRIG, HDL, CHOLHDL, VLDL, LDLCALC in the last 72 hours.  Studies/Results: MR Lumbar Spine W Wo Contrast  Result Date: 12/22/2019 CLINICAL DATA:  54 year old male with malignant appearing lymphadenopathy, metastatic disease on recent chest and body CT. Working diagnosis of lymphoma versus metastatic disease unknown primary. EXAM: MRI LUMBAR SPINE WITHOUT AND WITH CONTRAST TECHNIQUE: Multiplanar and multiecho pulse sequences of the lumbar spine were obtained without and with intravenous contrast. CONTRAST:  14mL GADAVIST GADOBUTROL 1 MMOL/ML IV SOLN COMPARISON:  CT Chest, Abdomen, and Pelvis 12/15/2019 FINDINGS: Segmentation:  Normal on the recent CTs. Alignment:  Preserved lumbar lordosis. Vertebrae: Diffusely abnormal marrow signal throughout the visible spine and pelvis with diffuse heterogeneous abnormal marrow enhancement following contrast. Mild pathologic compression fractures suspected at L4 and L5. No retropulsion. There is bulky extraosseous extension of tumor along both sides of the medial right iliac wing on series 10, image 27. See additional lumbar level details below. Conus medullaris and cauda equina: Conus extends to the T12-L1 level. No lower spinal cord or conus signal abnormality. No abnormal intradural enhancement. No dural thickening. Paraspinal  and other soft tissues: Bulky lymphadenopathy throughout the retroperitoneum again noted, with nodal conglomeration is measuring up to 39 mm axial dimension. Bulky extraosseous extension of tumor from the right iliac wing as stated above. Posterior paraspinal soft tissues appear to remain normal. Partially visible trace free fluid in the pelvis. Disc levels: Mild for age superimposed lumbar spine degeneration. There is multifactorial mild spinal and mild to moderate neural foraminal stenosis at L3-L4. No levels of lower thoracic epidural extension of tumor. In the lumbar spine there is focal extraosseous extension of tumor at the left L4 neural foramen (series 11, image 23) although no definite lumbar epidural space involvement at that level. In the sacral spine there is epidural tumor involvement at the left S1 foramen. IMPRESSION: 1. 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 2. Subtle pathologic compression fractures suspected at L4 and L5. Mild extraosseous tumor extension into the left L4 and left S1 neural foramina. But no other malignant neural impingement identified. 3. Bulky extraosseous tumor extension from the visible right iliac bone (series 10, image 27). 4. Bulky metastatic lymphadenopathy redemonstrated throughout the retroperitoneum. 5. Superimposed mild degenerative spinal and mild to moderate neural foraminal stenosis at L3-L4. Electronically Signed   By: Genevie Ann M.D.   On: 12/22/2019 15:21    Medications:  Scheduled: . (feeding supplement) PROSource Plus  30 mL Oral BID BM  . amLODipine  10 mg Oral Daily  . atorvastatin  20 mg Oral Daily  . bictegravir-emtricitabine-tenofovir AF  1 tablet Oral Daily  . dronabinol  5 mg Oral BID AC  . enoxaparin (LOVENOX) injection  40 mg Subcutaneous Q24H  . feeding supplement (ENSURE ENLIVE)  237 mL Oral TID BM  . folic acid  1 mg Oral QPM  . metoprolol succinate  37.5 mg Oral QPM  . nystatin  5  mL Oral QID  . polyethylene glycol  17 g Oral Daily  . risperiDONE  2 mg Oral QHS  . sodium chloride flush  10-40 mL Intracatheter Q12H   Assessment/Recommendations: 54 year old male with HIV on Biktarvy, CD4 count of 217 on 7/20, who presented with slurred speech, shuffling gait. Neuro exam revealed LLE weakness  on a background of generalized weakness.  -- MRI revealed normal appearance of the brain, with no acute stroke seen. Also without any evidence for an intra-axial metastatic or infectious process -- However, there were extra-axial findings on MRI brain that are suggestive of lymphoma -- CT abdomen/CT chestwas obtained which showed findings consistent with widespread neoplastic involvement-concerning for lymphoma. The patient's HIV+ status puts him at high risk for the development of lymphoma -- Oncology is following -- Surgery is following. Per their note, they requested that the patient be cleared by Neurology and Cardiology. Cleared for lymph node biopsy from a Neurological standpoint. Benefits of diagnostic information from biopsy far outweigh the neurological risks (nerve damage, stroke, anesthesia complications) of the procedure. -- LLE weakness has been further evaluated with an MRI of the lumbar spine. MRI L-spine (7/29) reveals the following: Positive for diffuse osseous metastatic disease throughout the entire visible spine and pelvis. Subtle pathologic compression fractures suspected at L4 and L5. Mild extraosseous tumor extension into the left L4 and left S1 neural foramina. No other malignant neural impingement identified. Bulky extraosseous tumor extension from the visible right iliac bone (series 10, image 27). Bulky metastatic lymphadenopathy redemonstrated throughout the retroperitoneum. Superimposed mild degenerative spinal and mild to moderate neural foraminal stenosis at L3-L4. - Today the patient is approprietly oriented an no cranial nerve deficits identified. Upper  extremities 5/5. He reports that his legs are "too weak". He is unable to lift either of his legs off the bed to gravity. When attempting to test the strength in his legs he has minimal resistance, except for plantar/dorsiflexion, which is strong bilaterally.  - Neurologic exam discussed with attending neurologists Dr. Cheral Marker and order placed for thoracic spine MRI with and without contrast to evaluate as possible source of lower extremity weakness.     Addendum:  -- MRI thoracic spine completed. Several abnormalities are noted:   -- Diffuse metastatic disease throughout the visible spine and thorax. Mild  pathologic compression fracture of T5.  -- Bulky thoracic paraspinal and intercostal tumor.   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 Neural foraminal involvement also at the bilateral T4,  left T5, and left T10 nerve levels. -- The T-spine MRI findings likely explain a component of the patient's LLE weakness. Lumbosacral plexus involvement by lymphoma is also relatively high on the DDx -- Treatment options primarily under the purview of Oncology, Radiation Oncology and Surgery -- Neurology will sign off. Please call if there are any additional questions.   Gwenyth Bouillon FNP-C [facility]   Electronically signed: Dr. Kerney Elbe 12/23/2019 12:10

## 2019-12-24 LAB — CBC
HCT: 23.6 % — ABNORMAL LOW (ref 39.0–52.0)
Hemoglobin: 7.5 g/dL — ABNORMAL LOW (ref 13.0–17.0)
MCH: 29 pg (ref 26.0–34.0)
MCHC: 31.8 g/dL (ref 30.0–36.0)
MCV: 91.1 fL (ref 80.0–100.0)
Platelets: 28 10*3/uL — CL (ref 150–400)
RBC: 2.59 MIL/uL — ABNORMAL LOW (ref 4.22–5.81)
RDW: 15 % (ref 11.5–15.5)
WBC: 4.7 10*3/uL (ref 4.0–10.5)
nRBC: 3.6 % — ABNORMAL HIGH (ref 0.0–0.2)

## 2019-12-24 LAB — GLUCOSE, CAPILLARY
Glucose-Capillary: 112 mg/dL — ABNORMAL HIGH (ref 70–99)
Glucose-Capillary: 143 mg/dL — ABNORMAL HIGH (ref 70–99)

## 2019-12-24 LAB — PHOSPHORUS: Phosphorus: 4.6 mg/dL (ref 2.5–4.6)

## 2019-12-24 NOTE — Progress Notes (Signed)
CRITICAL VALUE ALERT  Critical Value: Platelets 28  Date & Time Notied: 12/24/19 0820 Provider Notified: Dr. Delphia Grates   Orders Received/Actions taken: no new orders at this time

## 2019-12-24 NOTE — Progress Notes (Signed)
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  54 year old male with past medical history for HIV 1996, history of CVA, hypertension, depression, dyslipidemia and tachybrady syndrome status post pacemaker.  Patient reported 3 days of slurred speech and shuffling gait. Positive weight loss 20 pounds in 1 month, generalized weakness and decreased appetite. On his initial physical examination blood pressure 115/74, heart rate 105, respiratory rate 16, temperature 98.3, oxygen saturation 98%, his lungs were clear to auscultation bilaterally, heart S1-S2, present rhythmic, abdomen soft, no lower extremity edema. Patient had decreased strength 4/5 in lower extremities. Sodium 134, potassium 2.7, chloride 93, bicarb 24, glucose 113, BUN 70, creatinine 0.71, AST 29, ALT 13, white count 12.1, hemoglobin 9.9, hematocrit 31.3, platelets 156. SARS COVID-19 negative.  Patient was placed in frequent neuro checks and neurology was consulted. Further work-up with brain MRI which showed suboptimal evaluation due to significant artifact. Abnormal marrow signal which may reflect hematopoietic marrow or lymphomatous involvement.  CT chest and abdomen with widespread neoplastic involvement,considerable retroperitoneal adenopathy,enhancing pleural-based lesions throughout the right chest.  Surgery was consulted and patient underwent excisional biopsy right axillarydeeplymph nodeonJuly 28.  Further work up with lumbar spine MRI with diffuse osseous metastatic disease throughout the entire spine and pelvis. Compression fractures L4 and L5. 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     Assessment & Plan:   Principal Problem:   Widespread metastatic malignant neoplastic  disease ([place]) Active Problems:   HIV disease ([place])   Essential hypertension   Depression   Unintentional weight loss   HLD (hyperlipidemia)   Pacemaker   Hypokalemia   Hypomagnesemia   Normocytic anemia   Marijuana abuse   Slurred speech   Palliative care by specialist   Goals of care, counseling/discussion   DNR (do not resuscitate)   Acute respiratory failure with hypoxia ([place])   Protein-calorie malnutrition, severe   1. Focal neurologic deficit, CNS lymphom/ extensive retroperitoneal lymphadenopathy. Lumbar spine MRI with diffuse osseous metastatic disease throughout the entire spine and pelvis. Compression fractures L4 and L5. 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   Continue pending axillary lymph node biopsy result, for further treatment, high pretest probability for lymphoma.   Poor prognosis due to patient's physical functional status and diffuse disease.   2. Acute hypoxic respiratory failure/ positive right pleural based lesions, right lung cystic changes, right 7th rib pathologic fracture., respiratory failure has resolved.   3. Hypokalemia and hypomagnesemia. His renal function has been stable. Continue nutritional support, P has been stable 4,9 to 4,6.   4. Severe calorie protein malnutrition/ chronic anemia/ worsening thrombocytopenia/ pancytopenia. Hgb down to 7,5, wbc is 4,7, Plt 28.   Will dc all heparin products and will follow on cell count in am. Pancytopenia likely related to marrow failure related to malignancy.   5. HIV with oral candidiasis.On antiretroviral therapy with BIKTARVY  6. Depression and bipolar. On risperidone.   7. Tachy-brady syndrome sp pacer. Continue with metoprolol.   8. HTN/ dyslipidemia and pre-diabetes.Continue blood pressure control with amlodipine and metoprolol.    Status is:  Inpatient  Remains inpatient appropriate because:Inpatient level of care appropriate due to severity of illness   Dispo: The patient is from:  Home              Anticipated d/c is to: SNF              Anticipated d/c date is: 3 days              Patient currently is not medically stable to d/c.   DVT prophylaxis: scd   Code Status:   dnr   Family Communication:  No family at the bedside      Nutrition Status: Nutrition Problem: Severe Malnutrition Etiology: chronic illness (HIV) Signs/Symptoms: percent weight loss, severe muscle depletion Percent weight loss: 9.7 % (1 month) Interventions: Ensure Enlive (each supplement provides 350kcal and 20 grams of protein), Magic cup, Prostat     Skin Documentation:     Consultants:   Oncology   Surgery   Neurology    Subjective: Patient continue to be very weak and deconditioned, denies any chest pain or dyspnea, no back pain, or loss of continence.   Objective: Vitals:   12/24/19 0337 12/24/19 0338 12/24/19 0721 12/24/19 1100  BP:  112/76 124/81 109/75  Pulse:  (!) 112 (!) 111 (!) 27  Resp:  18    Temp:  98.3 F (36.8 C) 97.6 F (36.4 C) 97.7 F (36.5 C)  TempSrc:  Oral Oral Oral  SpO2:  98% 100% 100%  Weight: 60.4 kg     Height:        Intake/Output Summary (Last 24 hours) at 12/24/2019 1528 Last data filed at 12/24/2019 0416 Gross per 24 hour  Intake 357 ml  Output 750 ml  Net -393 ml   Filed Weights   12/14/2019 0458 12/23/19 0500 12/24/19 0337  Weight: 59.8 kg 58.9 kg 60.4 kg    Examination:   General: deconditioned and ill looking appearing  Neurology: Awake and alert, non focal  E ENT: positive pallor, no icterus, oral mucosa moist Cardiovascular: No JVD. S1-S2 present, rhythmic, no gallops, rubs, or murmurs. No lower extremity edema. Pulmonary: positive breath sounds bilaterally, adequate air movement, no wheezing, rhonchi or rales. Gastrointestinal. Abdomen soft and non tender Skin. No  rashes Musculoskeletal: no joint deformities     Data Reviewed: I have personally reviewed following labs and imaging studies  CBC: Recent Labs  Lab 12/18/19 0349 12/18/19 0349 12/19/19 0516 12/20/19 0442 12/18/2019 1241 12/22/19 0401 12/24/19 0702  WBC 6.9   < > 5.6 4.8 5.5 5.3 4.7  NEUTROABS 4.3  --  3.0 2.4 3.4 2.9  --   HGB 8.8*   < > 8.5* 7.7* 8.5* 8.3* 7.5*  HCT 27.2*   < > 26.1* 24.4* 27.0* 26.5* 23.6*  MCV 92.2   < > 92.2 92.8 91.8 92.7 91.1  PLT 101*   < > 70* 58* 55* 52* 28*   < > = values in this interval not displayed.   Basic Metabolic Panel: Recent Labs  Lab 12/18/19 0349 12/18/19 0349 12/19/19 0516 12/19/19 0516 12/20/19 0442 11/24/2019 1241 12/22/19 0401 12/23/19 0406 12/24/19 0702  NA 136  --  136  --  135 134* 138  --   --   K 4.3  --  4.2  --  4.2 4.4 4.7  --   --   CL 98  --  96*  --  96* 95* 99  --   --   CO2 25  --  24  --  25 25 23   --   --   GLUCOSE 128*  --  101*  --  94 132* 105*  --   --   BUN 7  --  9  --  15 17 25*  --   --   CREATININE 0.61  --  0.74  --  0.69 0.80 0.83  --   --   CALCIUM 10.2  --  10.3  --  10.2 10.7* 10.8*  --   --   MG 1.7  --  1.5*  --  1.9 1.6*  --   --   --   PHOS 2.2*   < > 3.5   < > 3.9 5.1* 4.9* 3.2 4.6   < > = values in this interval not displayed.   GFR: Estimated Creatinine Clearance: 87.9 mL/min (by C-G formula based on SCr of 0.83 mg/dL). Liver Function Tests: Recent Labs  Lab 12/18/19 0349 12/19/19 0516 12/20/19 0442 11/30/2019 1241  AST 34 39 27 34  ALT 13 16 14 17   ALKPHOS 156* 169* 158* 171*  BILITOT 0.8 0.8 0.4 0.7  PROT 6.1* 6.0* 5.6* 6.2*  ALBUMIN 2.5* 2.1* 1.9* 1.9*   No results for input(s): LIPASE, AMYLASE in the last 168 hours. No results for input(s): AMMONIA in the last 168 hours. Coagulation Profile: No results for input(s): INR, PROTIME in the last 168 hours. Cardiac Enzymes: No results for input(s): CKTOTAL, CKMB, CKMBINDEX, TROPONINI in the last 168 hours. BNP (last 3  results) No results for input(s): PROBNP in the last 8760 hours. HbA1C: No results for input(s): HGBA1C in the last 72 hours. CBG: Recent Labs  Lab 12/23/19 0758 12/23/19 1223 12/23/19 1614 12/23/19 2029 12/24/19 0020  GLUCAP 123* 119* 125* 143* 112*   Lipid Profile: No results for input(s): CHOL, HDL, LDLCALC, TRIG, CHOLHDL, LDLDIRECT in the last 72 hours. Thyroid Function Tests: No results for input(s): TSH, T4TOTAL, FREET4, T3FREE, THYROIDAB in the last 72 hours. Anemia Panel: No results for input(s): VITAMINB12, FOLATE, FERRITIN, TIBC, IRON, RETICCTPCT in the last 72 hours.    Radiology Studies: I have reviewed all of the imaging during this hospital visit personally     Scheduled Meds: . (feeding supplement) PROSource Plus  30 mL Oral BID BM  . amLODipine  10 mg Oral Daily  . atorvastatin  20 mg Oral Daily  . bictegravir-emtricitabine-tenofovir AF  1 tablet Oral Daily  . dronabinol  5 mg Oral BID AC  . feeding supplement (ENSURE ENLIVE)  237 mL Oral TID BM  . folic acid  1 mg Oral QPM  . metoprolol succinate  37.5 mg Oral QPM  . nystatin  5 mL Oral QID  . polyethylene glycol  17 g Oral Daily  . risperiDONE  2 mg Oral QHS  . sodium chloride flush  10-40 mL Intracatheter Q12H   Continuous Infusions:   LOS: 8 days        Waynetta Metheny Gerome Apley, MD

## 2019-12-25 LAB — BASIC METABOLIC PANEL
Anion gap: 17 — ABNORMAL HIGH (ref 5–15)
BUN: 25 mg/dL — ABNORMAL HIGH (ref 6–20)
CO2: 22 mmol/L (ref 22–32)
Calcium: 10.8 mg/dL — ABNORMAL HIGH (ref 8.9–10.3)
Chloride: 99 mmol/L (ref 98–111)
Creatinine, Ser: 0.9 mg/dL (ref 0.61–1.24)
GFR calc Af Amer: >60 mL/min (ref >60)
GFR calc non Af Amer: >60 mL/min (ref >60)
Glucose, Bld: 97 mg/dL (ref 70–99)
Potassium: 4.3 mmol/L (ref 3.5–5.1)
Sodium: 138 mmol/L (ref 135–145)

## 2019-12-25 LAB — CBC
HCT: 24.5 % — ABNORMAL LOW (ref 39.0–52.0)
Hemoglobin: 7.5 g/dL — ABNORMAL LOW (ref 13.0–17.0)
MCH: 28.5 pg (ref 26.0–34.0)
MCHC: 30.6 g/dL (ref 30.0–36.0)
MCV: 93.2 fL (ref 80.0–100.0)
Platelets: 27 10*3/uL — CL (ref 150–400)
RBC: 2.63 MIL/uL — ABNORMAL LOW (ref 4.22–5.81)
RDW: 15.5 % (ref 11.5–15.5)
WBC: 5.9 10*3/uL (ref 4.0–10.5)
nRBC: 4.9 % — ABNORMAL HIGH (ref 0.0–0.2)

## 2019-12-25 LAB — CBC WITH DIFFERENTIAL/PLATELET
Abs Immature Granulocytes: 1.18 10*3/uL — ABNORMAL HIGH (ref 0.00–0.07)
Basophils Absolute: 0.1 10*3/uL (ref 0.0–0.1)
Basophils Relative: 1 %
Eosinophils Absolute: 0.1 10*3/uL (ref 0.0–0.5)
Eosinophils Relative: 2 %
HCT: 25.1 % — ABNORMAL LOW (ref 39.0–52.0)
Hemoglobin: 7.6 g/dL — ABNORMAL LOW (ref 13.0–17.0)
Immature Granulocytes: 17 %
Lymphocytes Relative: 20 %
Lymphs Abs: 1.4 10*3/uL (ref 0.7–4.0)
MCH: 28.5 pg (ref 26.0–34.0)
MCHC: 30.3 g/dL (ref 30.0–36.0)
MCV: 94 fL (ref 80.0–100.0)
Monocytes Absolute: 0.7 10*3/uL (ref 0.1–1.0)
Monocytes Relative: 11 %
Neutro Abs: 3.3 10*3/uL (ref 1.7–7.7)
Neutrophils Relative %: 49 %
Platelets: 25 10*3/uL — CL (ref 150–400)
RBC: 2.67 MIL/uL — ABNORMAL LOW (ref 4.22–5.81)
RDW: 15.7 % — ABNORMAL HIGH (ref 11.5–15.5)
WBC: 6.8 10*3/uL (ref 4.0–10.5)
nRBC: 4 % — ABNORMAL HIGH (ref 0.0–0.2)

## 2019-12-25 LAB — HEPATIC FUNCTION PANEL
ALT: 19 U/L (ref 0–44)
AST: 44 U/L — ABNORMAL HIGH (ref 15–41)
Albumin: 1.8 g/dL — ABNORMAL LOW (ref 3.5–5.0)
Alkaline Phosphatase: 163 U/L — ABNORMAL HIGH (ref 38–126)
Bilirubin, Direct: 0.4 mg/dL — ABNORMAL HIGH (ref 0.0–0.2)
Indirect Bilirubin: 0.7 mg/dL (ref 0.3–0.9)
Total Bilirubin: 1.1 mg/dL (ref 0.3–1.2)
Total Protein: 5.6 g/dL — ABNORMAL LOW (ref 6.5–8.1)

## 2019-12-25 LAB — RETICULOCYTES
Immature Retic Fract: 30 % — ABNORMAL HIGH (ref 2.3–15.9)
RBC.: 2.72 MIL/uL — ABNORMAL LOW (ref 4.22–5.81)
Retic Count, Absolute: 20.4 10*3/uL (ref 19.0–186.0)
Retic Ct Pct: 0.8 % (ref 0.4–3.1)

## 2019-12-25 LAB — SAVE SMEAR(SSMR), FOR PROVIDER SLIDE REVIEW

## 2019-12-25 LAB — LACTATE DEHYDROGENASE: LDH: 4264 U/L — ABNORMAL HIGH (ref 98–192)

## 2019-12-25 LAB — PHOSPHORUS: Phosphorus: 4.7 mg/dL — ABNORMAL HIGH (ref 2.5–4.6)

## 2019-12-25 LAB — MAGNESIUM: Magnesium: 1.9 mg/dL (ref 1.7–2.4)

## 2019-12-25 LAB — ABO/RH: ABO/RH(D): O POS

## 2019-12-25 LAB — PREPARE RBC (CROSSMATCH)

## 2019-12-25 LAB — TSH: TSH: 4.343 u[IU]/mL (ref 0.350–4.500)

## 2019-12-25 LAB — GLUCOSE, CAPILLARY: Glucose-Capillary: 107 mg/dL — ABNORMAL HIGH (ref 70–99)

## 2019-12-25 MED ORDER — METOPROLOL TARTRATE 5 MG/5ML IV SOLN
5.0000 mg | INTRAVENOUS | Status: DC | PRN
  Administered 2019-12-25 (×3): 5 mg via INTRAVENOUS
  Filled 2019-12-25 (×3): qty 5

## 2019-12-25 MED ORDER — DEXTROSE 5 % IV SOLN: INTRAVENOUS | Status: AC

## 2019-12-25 MED ORDER — SODIUM CHLORIDE 0.9% IV SOLUTION: Freq: Once | INTRAVENOUS | Status: DC

## 2019-12-25 MED ORDER — SODIUM CHLORIDE 0.9 % IV BOLUS
250.0000 mL | Freq: Once | INTRAVENOUS | Status: AC
  Administered 2019-12-25: 250 mL via INTRAVENOUS

## 2019-12-25 NOTE — Progress Notes (Addendum)
   Palliative Medicine Inpatient Follow Up Note  Reason for consult:  Goals of Care "Patient with HIV, metastatic neoplasm change of CODE STATUS, short and long-term goals of care"  HPI:  Per intake H&P --> Mike Holland a 54 y.o.malewith medical history significant ofHIV-diagnosed in 1996, CVA, hypertension, depression, hyperlipidemia, tachybradycardia syndrome? 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  Palliative care was asked to assist with goals of care and code status discussions.  Today's Discussion (12/25/2019): Chart reviewed. Met with Mike Holland at bedside this evening, he was more lethargic and noted to be tachy on cardiac monitor. Per review of notes patient has had a more acute decline in terms of functional and physical states. Plan to receive 2 units of PRBCs for thrombocytopenia. Oncology will see patient tomorrow. If treatment options for him are poor depending on biopsy results will discuss hospice at greater lengths.   Discussed the importance of continued conversation with family and their  medical providers regarding overall plan of care and treatment options, ensuring decisions are within the context of the patients values and GOCs.  Questions and concerns addressed   SUMMARY OF RECOMMENDATIONS   DNAR/DNI  MOST Completed, paper copy placed onto the chart electric copy can be found in the media section of epic  DNR Form Completed, paper copy placed onto the chart electric copy can be found in the media section of epic  Chaplain -->  Prayer  Patients short term goal is to get a better impression of what he is looking at from a cancer perspective. He would like to continue taking life, "one day at a time." He does not wish to plan too far into the future.  S/P lymph node biopsy, awaiting pathology results  Plan to speak to patients primary team and oncology  tomorrow given patients declining state it may be reasonable to further discuss hospice   Time Spent: 25 Greater than 50% of the time was spent in counseling and coordination of care ______________________________________________________________________________________ [place] Team Team Cell Phone: [phone] Please utilize secure chat with additional questions, if there is no response within 30 minutes please call the above phone number  Palliative Medicine Team providers are available by phone from 7am to 7pm daily and can be reached through the team cell phone.  Should this patient require assistance outside of these hours, please call the patient's attending physician.

## 2019-12-25 NOTE — Progress Notes (Signed)
Pt much weaker today than yesterday. Pt is unable to hold utensils to eat, cups to take meds or drink liquids and talking has been breathy and difficult. Hr continues to remain above 120 despite administering prn metoprolol. MD on call aware. New orders for 250 NS bolus in process now. Labs and EKG done. Waiting for echo to be done.

## 2019-12-25 NOTE — Progress Notes (Signed)
Lethargic, persistently tachycardic.  Sinus Tachycardia on 12 lead EKG. TSH and 2D echo are pending.  Acute drop in Hg and acute thrombocytopenia with recently elevated LDH >2000.  Labs ordered to r/o hemolytic anemia, peripheral blood smear, reticulocytes, LDH, haptoglobin, bilirubin.  Poor oral intake but responding to feeding with assist. CBG 107.  Will start D5W at 30cc/hr to avoid hypoglycemia.  Patient consents to PRBCs transfusion.  Will transfuse 2U PRBCs for symptomatic anemia.  Oncology will see in the AM.  Results of pathology are pending.

## 2019-12-25 NOTE — Significant Event (Signed)
Rapid Response Event Note  Reason for Call : Called as a second set of eyes for pt with HR-116, RR-28, and increasing FiO2 demand t/o night.    Initial Focused Assessment:  Pt laying in bed with eyes open in no distress. Pt is alert and oriented, follows commands, and moves all extremities. Pt denies chest pain/SOB. Lungs clear t/o except for some scattered crackles heard in the base of the R lung. Skin warm and dry. T-98.8, HR-120, BP-120/75, RR-25, SpO2-98% on 6L [place]. Pt pulled up in bed and repositioned. Pt FiO2 titrated down to 2L [place] with SpO2 decreasing to 90%.    Interventions: None at this time.   Plan of Care: Pt has been tachycardic. This is being treated with scheduled metoprolol PO and, now, metoprolol IV prn q4h. Tachypnea is new per chart and pt's RN. ??whether PCXR would be helpful(his last one was 7/25)-pt is currently in no distress, just tachypneic. Attempted to titrate FiO2 down from 6L [place] to 2L [place] with SpO2 dropping to 90%. This was titrated up to 4L [place], SpO2-96%. Continue to monitor pt closely. Call RRT if further assistance needed.   Event Summary:  MD Notified:  Call [id number] Arrival [id number] End [id number]  Dillard Essex, RN

## 2019-12-25 NOTE — Progress Notes (Signed)
0208 - Pt sustaining a HR of 122-125. On call provider notified. Received order for 5 mg of Metoprolol IV q4h PRN.

## 2019-12-25 NOTE — Progress Notes (Signed)
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  54 year old male with past medical history for HIV 1996, history of CVA, hypertension, depression, dyslipidemia and tachybrady syndrome status post pacemaker.  Patient reported 3 days of slurred speech and shuffling gait. Positive weight loss 20 [place] in 1 month, generalized weakness and decreased appetite. On his initial physical examination blood pressure 115/74, heart rate 105, respiratory rate 16, temperature 98.3, oxygen saturation 98%, his lungs were clear to auscultation bilaterally, heart S1-S2, present rhythmic, abdomen soft, no lower extremity edema. Patient had decreased strength 4/5 in lower extremities. Sodium 134, potassium 2.7, chloride 93, bicarb 24, glucose 113, BUN 70, creatinine 0.71, AST 29, ALT 13, white count 12.1, hemoglobin 9.9, hematocrit 31.3, platelets 156. SARS COVID-19 negative.  Patient was placed in frequent neuro checks and neurology was consulted. Further work-up with brain MRI which showed suboptimal evaluation due to significant artifact. Abnormal marrow signal which may reflect hematopoietic marrow or lymphomatous involvement.  CT chest and abdomen with widespread neoplastic involvement,considerable retroperitoneal adenopathy,enhancing pleural-based lesions throughout the right chest.  Surgery was consulted and patient underwent excisional biopsy right axillarydeeplymph nodeonJuly 28.  Further work up with lumbar spine MRIwith diffuse osseous metastatic disease throughout the entire spine and pelvis. Compression fractures L4 and L5. 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  12/25/19: No new complaints.  Tachycardic and tachypneic overnight.  Hg 7.5K and plt count   27K.  Denies palpitations or overt bleeding.  Off pharmacological DVT PPX due to thrombocytopenia.   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 Focal neurologic deficit, CNS lymphoma/ extensive retroperitoneal lymphadenopathy. Lumbar spine MRI with diffuse osseous metastatic disease throughout the entire spine and pelvis. Compression fractures L4 and L5. 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   Continue to follow results, pending axillary lymph node biopsy result, for further treatment, high pretest probability for lymphoma.   Poor prognosis due to patient's physical functional status and diffuse disease.   2. Acute hypoxic respiratory failure/ positive right pleural based lesions, right lung cystic changes, right 7th rib pathologic fracture., respiratory failure has resolved.  3. Hypokalemia and hypomagnesemia. Replete as needed. Repeat levels.  4. Severe calorie protein malnutrition/ chronic anemia/ worsening thrombocytopenia/ pancytopenia. On Dronabinol for appetite stimulant. Hgb down to 7,5, wbc is 4,7, Plt 28K.   Pancytopenia likely related to marrow failure related to malignancy.   5. HIV with oral candidiasis.On antiretroviral therapy with BIKTARVY  6. Depression and bipolar.On risperidone.   7. Tachy-brady syndrome sp pacer. Continue with metoprolol and  norvasc.  8. HTN/ dyslipidemia and pre-diabetes.Continue blood pressure control with amlodipine and metoprolol.    Status is: Inpatient  Remains inpatient appropriate because:Inpatient level of care appropriate due to severity of illness   Dispo:  The patient is from: Home  Anticipated d/c is to: SNF  Anticipated d/c date is: 01/09/2020  Patient currently is not medically stable to d/c, persistent tachycardia, electrolytes abnormalities.   DVT prophylaxis:      scd   Code Status:              dnr   Family Communication:       No family at the bedside      Nutrition Status: Nutrition Problem: Severe Malnutrition Etiology: chronic illness (HIV) Signs/Symptoms: percent weight loss, severe muscle depletion Percent weight loss: 9.7 % (1 month) Interventions: Ensure Enlive (each supplement provides 350kcal and 20 grams of protein), Magic cup, Prostat     Skin Documentation:   Consultants:   Oncology   Surgery   Neurology     Objective: Vitals:   12/25/19 0322 12/25/19 0352 12/25/19 0425 12/25/19 0735  BP: (!) 128/60 120/75  102/68  Pulse:  91  (!) 116  Resp: 17 (!) 28  20  Temp: 98.2 F (36.8 C) 98.8 F (37.1 C)  (!) 97.4 F (36.3 C)  TempSrc: Oral     SpO2:  91%  (!) 89%  Weight:   58.7 kg   Height:        Intake/Output Summary (Last 24 hours) at 12/25/2019 1133 Last data filed at 12/25/2019 0900 Gross per 24 hour  Intake 240 ml  Output 825 ml  Net -585 ml   Filed Weights   12/23/19 0500 12/24/19 0337 12/25/19 0425  Weight: 58.9 kg 60.4 kg 58.7 kg    Exam:   General: 54 y.o. year-old male Frail appearing, in no acute distress.  Alert and oriented x3.  Cardiovascular: Regular rate and rhythm with no rubs or gallops.  No thyromegaly or JVD noted.    Respiratory: Mild rales at bases.  Poor inspiratory efforts.  Abdomen: Soft nontender nondistended with normal bowel sounds.  Musculoskeletal: No  lower extremity edema.   Psychiatry: Mood is appropriate for condition and setting   Data Reviewed: CBC: Recent Labs  Lab 12/19/19 0516 12/19/19 0516 12/20/19 0442 12/05/2019 1241 12/22/19 0401 12/24/19 0702 12/25/19 0412  WBC 5.6   < > 4.8 5.5 5.3 4.7 5.9  NEUTROABS 3.0  --  2.4 3.4 2.9  --   --   HGB 8.5*   < > 7.7* 8.5* 8.3* 7.5* 7.5*  HCT 26.1*   < > 24.4* 27.0* 26.5* 23.6* 24.5*  MCV 92.2   < > 92.8 91.8 92.7 91.1 93.2  PLT 70*   < > 58* 55* 52* 28* 27*   < > = values in this interval not displayed.   Basic Metabolic Panel: Recent Labs  Lab 12/19/19 0516 12/19/19 0516 12/20/19 0442 12/20/19 0442 12/14/2019 1241 12/22/19 0401 12/23/19 0406 12/24/19 0702 12/25/19 0412  NA 136  --  135  --  134* 138  --   --   --   K 4.2  --  4.2  --  4.4 4.7  --   --   --   CL 96*  --  96*  --  95* 99  --   --   --   CO2 24  --  25  --  25 23  --   --   --   GLUCOSE 101*  --  94  --  132* 105*  --   --   --   BUN 9  --  15  --  17 25*  --   --   --  CREATININE 0.74  --  0.69  --  0.80 0.83  --   --   --   CALCIUM 10.3  --  10.2  --  10.7* 10.8*  --   --   --   MG 1.5*  --  1.9  --  1.6*  --   --   --   --   PHOS 3.5   < > 3.9   < > 5.1* 4.9* 3.2 4.6 4.7*   < > = values in this interval not displayed.   GFR: Estimated Creatinine Clearance: 85.5 mL/min (by C-G formula based on SCr of 0.83 mg/dL). Liver Function Tests: Recent Labs  Lab 12/19/19 0516 12/20/19 0442 12/15/2019 1241  AST 39 27 34  ALT 16 14 17   ALKPHOS 169* 158* 171*  BILITOT 0.8 0.4 0.7  PROT 6.0* 5.6* 6.2*  ALBUMIN 2.1* 1.9* 1.9*   No results for input(s): LIPASE, AMYLASE in the last 168 hours. No results for input(s): AMMONIA in the last 168 hours. Coagulation Profile: No results for input(s): INR, PROTIME in the last 168 hours. Cardiac Enzymes: No results for input(s): CKTOTAL, CKMB, CKMBINDEX, TROPONINI in the last 168 hours. BNP (last 3 results) No results for input(s): PROBNP in the last 8760  hours. HbA1C: No results for input(s): HGBA1C in the last 72 hours. CBG: Recent Labs  Lab 12/23/19 0758 12/23/19 1223 12/23/19 1614 12/23/19 2029 12/24/19 0020  GLUCAP 123* 119* 125* 143* 112*   Lipid Profile: No results for input(s): CHOL, HDL, LDLCALC, TRIG, CHOLHDL, LDLDIRECT in the last 72 hours. Thyroid Function Tests: No results for input(s): TSH, T4TOTAL, FREET4, T3FREE, THYROIDAB in the last 72 hours. Anemia Panel: No results for input(s): VITAMINB12, FOLATE, FERRITIN, TIBC, IRON, RETICCTPCT in the last 72 hours. Urine analysis:    Component Value Date/Time   COLORURINE AMBER (A) 12/11/2019 1254   APPEARANCEUR CLEAR 12/02/2019 1254   LABSPEC 1.020 12/09/2019 1254   PHURINE 6.0 12/08/2019 1254   GLUCOSEU NEGATIVE 12/22/2019 1254   HGBUR NEGATIVE 12/12/2019 1254   BILIRUBINUR SMALL (A) 12/04/2019 1254   KETONESUR NEGATIVE 11/25/2019 1254   PROTEINUR NEGATIVE 12/11/2019 1254   NITRITE NEGATIVE 12/18/2019 1254   LEUKOCYTESUR NEGATIVE 12/20/2019 1254   Sepsis Labs: @LABRCNTIP (procalcitonin:4,lacticidven:4)  ) Recent Results (from the past 240 hour(s))  SARS Coronavirus 2 by RT PCR (hospital order, performed in [place] hospital lab) Nasopharyngeal Nasopharyngeal Swab     Status: None   Collection Time: 12/11/2019  3:08 PM   Specimen: Nasopharyngeal Swab  Result Value Ref Range Status   SARS Coronavirus 2 NEGATIVE NEGATIVE Final    Comment: (NOTE) SARS-CoV-2 target nucleic acids are NOT DETECTED.  The SARS-CoV-2 RNA is generally detectable in upper and lower respiratory specimens during the acute phase of infection. The lowest concentration of SARS-CoV-2 viral copies this assay can detect is 250 copies / mL. A negative result does not preclude SARS-CoV-2 infection and should not be used as the sole basis for treatment or other patient management decisions.  A negative result may occur with improper specimen collection / handling, submission of specimen  other than nasopharyngeal swab, presence of viral mutation(s) within the areas targeted by this assay, and inadequate number of viral copies (<250 copies / mL). A negative result must be combined with clinical observations, patient history, and epidemiological information.  Fact Sheet for Patients:   StrictlyIdeas.no  Fact Sheet for Healthcare Providers: BankingDealers.co.za  This test is not yet approved or  cleared by the [place]  FDA and has been authorized for detection and/or diagnosis of SARS-CoV-2 by FDA under an Emergency Use Authorization (EUA).  This EUA will remain in effect (meaning this test can be used) for the duration of the COVID-19 declaration under Section 564(b)(1) of the Act, 21 U.S.C. section 360bbb-3(b)(1), unless the authorization is terminated or revoked sooner.  Performed at [facility], [place] [address] [place], [place] [address]   MRSA PCR Screening     Status: None   Collection Time: 12/18/19 10:25 PM   Specimen: Nasal Mucosa; Nasopharyngeal  Result Value Ref Range Status   MRSA by PCR NEGATIVE NEGATIVE Final    Comment:        The GeneXpert MRSA Assay (FDA approved for NASAL specimens only), is one component of a comprehensive MRSA colonization surveillance program. It is not intended to diagnose MRSA infection nor to guide or monitor treatment for MRSA infections. Performed at [facility], [place] [address] [place], [place] [address]       Studies: No results found.  Scheduled Meds:  (feeding supplement) PROSource Plus  30 mL Oral BID BM   amLODipine  10 mg Oral Daily   atorvastatin  20 mg Oral Daily   bictegravir-emtricitabine-tenofovir AF  1 tablet Oral Daily   dronabinol  5 mg Oral BID AC   feeding supplement (ENSURE ENLIVE)  237 mL Oral TID BM   folic acid  1 mg Oral QPM   metoprolol succinate  37.5 mg Oral QPM   nystatin  5 mL Oral QID   polyethylene  glycol  17 g Oral Daily   risperiDONE  2 mg Oral QHS   sodium chloride flush  10-40 mL Intracatheter Q12H    Continuous Infusions:   LOS: 9 days     Kayleen Memos, MD [facility] Pager [phone]  If 7PM-7AM, please contact night-coverage [email or web address] Password [facility] 12/25/2019, 11:33 AM

## 2019-12-25 DEATH — deceased

## 2019-12-26 ENCOUNTER — Inpatient Hospital Stay (HOSPITAL_COMMUNITY): Payer: Medicaid Other

## 2019-12-26 DIAGNOSIS — R0609 Other forms of dyspnea: Secondary | ICD-10-CM

## 2019-12-26 DIAGNOSIS — R9431 Abnormal electrocardiogram [ECG] [EKG]: Secondary | ICD-10-CM

## 2019-12-26 DIAGNOSIS — R0600 Dyspnea, unspecified: Secondary | ICD-10-CM

## 2019-12-26 DIAGNOSIS — K117 Disturbances of salivary secretion: Secondary | ICD-10-CM

## 2019-12-26 DIAGNOSIS — R591 Generalized enlarged lymph nodes: Secondary | ICD-10-CM

## 2019-12-26 LAB — COMPREHENSIVE METABOLIC PANEL
ALT: 20 U/L (ref 0–44)
AST: 39 U/L (ref 15–41)
Albumin: 1.9 g/dL — ABNORMAL LOW (ref 3.5–5.0)
Alkaline Phosphatase: 158 U/L — ABNORMAL HIGH (ref 38–126)
Anion gap: 19 — ABNORMAL HIGH (ref 5–15)
BUN: 38 mg/dL — ABNORMAL HIGH (ref 6–20)
CO2: 23 mmol/L (ref 22–32)
Calcium: 10.9 mg/dL — ABNORMAL HIGH (ref 8.9–10.3)
Chloride: 107 mmol/L (ref 98–111)
Creatinine, Ser: 1.08 mg/dL (ref 0.61–1.24)
GFR calc Af Amer: >60 mL/min (ref >60)
GFR calc non Af Amer: >60 mL/min (ref >60)
Glucose, Bld: 127 mg/dL — ABNORMAL HIGH (ref 70–99)
Potassium: 3.7 mmol/L (ref 3.5–5.1)
Sodium: 149 mmol/L — ABNORMAL HIGH (ref 135–145)
Total Bilirubin: 1.4 mg/dL — ABNORMAL HIGH (ref 0.3–1.2)
Total Protein: 5.5 g/dL — ABNORMAL LOW (ref 6.5–8.1)

## 2019-12-26 LAB — CBC
HCT: 33.4 % — ABNORMAL LOW (ref 39.0–52.0)
Hemoglobin: 10.7 g/dL — ABNORMAL LOW (ref 13.0–17.0)
MCH: 28.2 pg (ref 26.0–34.0)
MCHC: 32 g/dL (ref 30.0–36.0)
MCV: 88.1 fL (ref 80.0–100.0)
Platelets: 20 10*3/uL — CL (ref 150–400)
RBC: 3.79 MIL/uL — ABNORMAL LOW (ref 4.22–5.81)
RDW: 16.5 % — ABNORMAL HIGH (ref 11.5–15.5)
WBC: 5.7 10*3/uL (ref 4.0–10.5)
nRBC: 3.5 % — ABNORMAL HIGH (ref 0.0–0.2)

## 2019-12-26 LAB — ECHOCARDIOGRAM COMPLETE
Height: 67 in
S' Lateral: 2.9 cm
Weight: 2070.56 oz

## 2019-12-26 LAB — SURGICAL PATHOLOGY

## 2019-12-26 LAB — PHOSPHORUS: Phosphorus: 4.6 mg/dL (ref 2.5–4.6)

## 2019-12-26 LAB — HAPTOGLOBIN: Haptoglobin: 316 mg/dL (ref 29–370)

## 2019-12-26 MED ORDER — GLYCOPYRROLATE 0.2 MG/ML IJ SOLN
0.4000 mg | Freq: Four times a day (QID) | INTRAMUSCULAR | Status: DC
  Administered 2019-12-26 (×2): 0.4 mg via INTRAVENOUS
  Filled 2019-12-26 (×3): qty 2

## 2019-12-26 MED ORDER — GLYCOPYRROLATE 0.2 MG/ML IJ SOLN: 0.1000 mg | Freq: Once | INTRAMUSCULAR | Status: DC

## 2019-12-26 MED ORDER — FUROSEMIDE 10 MG/ML IJ SOLN: 20.0000 mg | Freq: Once | INTRAMUSCULAR | Status: DC

## 2019-12-26 MED ORDER — LORAZEPAM 2 MG/ML IJ SOLN
1.0000 mg | INTRAMUSCULAR | Status: DC | PRN
  Administered 2019-12-26 (×2): 1 mg via INTRAVENOUS
  Filled 2019-12-26 (×3): qty 1

## 2019-12-26 MED ORDER — MORPHINE SULFATE (PF) 2 MG/ML IV SOLN
2.0000 mg | INTRAVENOUS | Status: DC | PRN
  Administered 2019-12-26 (×2): 2 mg via INTRAVENOUS
  Filled 2019-12-26 (×3): qty 1

## 2019-12-27 DIAGNOSIS — R627 Adult failure to thrive: Secondary | ICD-10-CM | POA: Diagnosis present

## 2019-12-27 DIAGNOSIS — C8510 Unspecified B-cell lymphoma, unspecified site: Secondary | ICD-10-CM | POA: Diagnosis present

## 2019-12-27 DIAGNOSIS — D638 Anemia in other chronic diseases classified elsewhere: Secondary | ICD-10-CM | POA: Diagnosis present

## 2019-12-27 DIAGNOSIS — D696 Thrombocytopenia, unspecified: Secondary | ICD-10-CM | POA: Diagnosis not present

## 2019-12-27 DIAGNOSIS — C7951 Secondary malignant neoplasm of bone: Secondary | ICD-10-CM | POA: Diagnosis present

## 2019-12-27 DIAGNOSIS — I495 Sick sinus syndrome: Secondary | ICD-10-CM | POA: Diagnosis present

## 2019-12-27 DIAGNOSIS — I69359 Hemiplegia and hemiparesis following cerebral infarction affecting unspecified side: Secondary | ICD-10-CM

## 2019-12-27 LAB — BPAM RBC
Blood Product Expiration Date: 202108292359
Blood Product Expiration Date: 202108292359
ISSUE DATE / TIME: 202108012150
ISSUE DATE / TIME: 202108020225
Unit Type and Rh: 5100
Unit Type and Rh: 5100

## 2019-12-27 LAB — TYPE AND SCREEN
ABO/RH(D): O POS
Antibody Screen: NEGATIVE
Unit division: 0
Unit division: 0

## 2019-12-27 LAB — PATHOLOGIST SMEAR REVIEW

## 2019-12-27 NOTE — Progress Notes (Signed)
Pt passed away this evening. Two nurses confirmed that there was no pulse. On call provider notified. Family notified.  45 - family at bedside. All of pts belongings given to family.

## 2020-01-07 DIAGNOSIS — Z515 Encounter for palliative care: Secondary | ICD-10-CM

## 2020-01-09 ENCOUNTER — Other Ambulatory Visit: Payer: Self-pay | Admitting: Family

## 2020-01-13 ENCOUNTER — Ambulatory Visit: Payer: Medicaid - Out of State | Admitting: Family

## 2020-01-25 NOTE — Progress Notes (Signed)
PT Cancellation Note  Patient Details Name: Mike Holland MRN: [id number] DOB: September 19, 1965   Cancelled Treatment:    Reason Eval/Treat Not Completed: Medical issues which prohibited therapy Holding PT per RN as pt with a fast decline in functional strength/status since last week. Awaiting oncology to help with goals of care; palliative care already following. Will follow up in a few days.   Marguarite Arbour A [place] 01/03/20, 10:32 AM Marisa Severin, PT, DPT Acute Rehabilitation Services Pager [phone] Office [phone]

## 2020-01-25 NOTE — Progress Notes (Signed)
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  54 year old male with past medical history for HIV 1996, history of CVA, hypertension, depression, dyslipidemia and tachybrady syndrome status post pacemaker.  Patient reported 3 days of slurred speech and shuffling gait. Positive weight loss 20 pounds in 1 month, generalized weakness and decreased appetite. On his initial physical examination blood pressure 115/74, heart rate 105, respiratory rate 16, temperature 98.3, oxygen saturation 98%, his lungs were clear to auscultation bilaterally, heart S1-S2, present rhythmic, abdomen soft, no lower extremity edema. Patient had decreased strength 4/5 in lower extremities. Sodium 134, potassium 2.7, chloride 93, bicarb 24, glucose 113, BUN 70, creatinine 0.71, AST 29, ALT 13, white count 12.1, hemoglobin 9.9, hematocrit 31.3, platelets 156. SARS COVID-19 negative.  Patient was placed in frequent neuro checks and neurology was consulted. Further work-up with brain MRI which showed suboptimal evaluation due to significant artifact. Abnormal marrow signal which may reflect hematopoietic marrow or lymphomatous involvement.  CT chest and abdomen with widespread neoplastic involvement,considerable retroperitoneal adenopathy,enhancing pleural-based lesions throughout the right chest.  Surgery was consulted and patient underwent excisional biopsy right axillarylymph nodeonJuly 28.  Further work up with lumbar spine MRIwith diffuse osseous metastatic disease throughout the entire spine and pelvis. Compression fractures L4 and L5. 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  Hospital course complicated by lethargy and symptomatic anemia with drop in hemoglobin and platelet  count.  Hematology/oncology was consulted and followed.  Palliative care medicine was also consulted to establish goals of care.  On Jan 03, 2020 patient and family made decision for full comfort care.  03-Jan-2020:  Seen and examined.  Feels a little better after 2U PRBCs transfusion.  Still weak.    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 Lumbar spine MRI with diffuse osseous metastatic disease throughout the entire spine and pelvis. Compression fractures L4 and L5. 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  Pending axillary lymph node biopsy result Poor prognosis due to patient's physical functional status and diffuse disease.  Now full comfort care  Acute hypoxic respiratory failure/ Small B/L pleural effusions/ right pleural based lesions, right lung cystic changes, right 7th rib pathologic fracture.  Resolved Hypokalemia and hypomagnesemia.  Severe calorie protein malnutrition/ chronic anemia/ On Dronabinol for appetite stimulant.  Worsening acute thrombocytopenia/ drop in hemoglobin r/o hemolytic anemia/  Elevated LDH and T.bilirubin Haptoglobin pending Transfused 2U PRBCs  HIV with oral candidiasis.On antiretroviral therapy with BIKTARVY. CD4 count 217. Now full comfort  Depression and  bipolar.On risperidone. Now full comfort  Tachy-brady syndrome sp pacer. Now full comfort  HTN/ dyslipidemia and pre-diabetes.Full comfort   Status is: Inpatient  Remains inpatient appropriate because:Inpatient level of care appropriate due to severity  of illness   Dispo: The patient is from: Home  Anticipated d/c is to: SNF  Anticipated d/c date is: 01/18/2020  Patient currently is not medically stable to d/c, persistent tachycardia, electrolytes abnormalities. Now full comfort.   DVT prophylaxis: Comfort care only. Code Status:   DNR/comfort care only  Family Communication:   No family at bedside.    Nutrition Status: Nutrition Problem: Severe Malnutrition Etiology: chronic illness (HIV) Signs/Symptoms: percent weight loss, severe muscle depletion Percent weight loss: 9.7 % (1 month) Interventions: Ensure Enlive (each supplement provides 350kcal and 20 grams of protein), Magic cup, Prostat     Skin Documentation:   Consultants:   Oncology   Surgery   Neurology     Objective: Vitals:   2019-12-29 0300 12-29-2019 0505 29-Dec-2019 0545 December 29, 2019 0739  BP: 100/71 103/73 93/69 112/76  Pulse: (!) 115 (!) 107 (!) 110 100  Resp: (!) 26 (!) 26 (!) 24 (!) 24  Temp: 98.5 F (36.9 C) 97.9 F (36.6 C) 97.9 F (36.6 C) 98.9 F (37.2 C)  TempSrc: Axillary  Axillary   SpO2: 92% 92% 95% 98%  Weight:      Height:        Intake/Output Summary (Last 24 hours) at 12/29/2019 1302 Last data filed at 12/29/19 1053 Gross per 24 hour  Intake 712 ml  Output 650 ml  Net 62 ml   Filed Weights   12/23/19 0500 12/24/19 0337 12/25/19 0425  Weight: 58.9 kg 60.4 kg 58.7 kg    Exam:  . General: 54 y.o. year-old male Frail, weak appearing no acute distress. . Cardiovascular: Regular rate and rhythm no rubs or gallops.   Marland Kitchen Respiratory: Mild rales bilaterally, no wheezing.  Poor inspiratory effort.  .  Abdomen: Soft nontender  normal bowel sounds present.   . Musculoskeletal: No lower extremity edema bilaterally.   Marland Kitchen Psychiatry: Mood is appropriate for condition and setting.  Data Reviewed: CBC: Recent Labs  Lab 12/20/19 0442 12/20/19 0442 12/16/2019 1241 12/19/2019 1241 12/22/19 0401 12/24/19 0702 12/25/19 0412 12/25/19 1840 December 29, 2019 0709  WBC 4.8   < > 5.5   < > 5.3 4.7 5.9 6.8 5.7  NEUTROABS 2.4  --  3.4  --  2.9  --   --  3.3  --   HGB 7.7*   < > 8.5*   < > 8.3* 7.5* 7.5* 7.6* 10.7*  HCT 24.4*   < > 27.0*   < > 26.5* 23.6* 24.5* 25.1* 33.4*  MCV 92.8   < > 91.8   < > 92.7 91.1 93.2 94.0 88.1  PLT 58*   < > 55*   < > 52* 28* 27* 25* 20*   < > = values in this interval not displayed.   Basic Metabolic Panel: Recent Labs  Lab 12/20/19 0442 12/20/19 0442 11/24/2019 1241 12/19/2019 1241 12/22/19 0401 12/23/19 0406 12/24/19 0702 12/25/19 0412 12-29-19 0709  NA 135  --  134*  --  138  --   --  138 149*  K 4.2  --  4.4  --  4.7  --   --  4.3 3.7  CL 96*  --  95*  --  99  --   --  99 107  CO2 25  --  25  --  23  --   --  22 23  GLUCOSE 94  --  132*  --  105*  --   --  97 127*  BUN 15  --  17  --  25*  --   --  25* 38*  CREATININE 0.69  --  0.80  --  0.83  --   --  0.90 1.08  CALCIUM 10.2  --  10.7*  --  10.8*  --   --  10.8* 10.9*  MG 1.9  --  1.6*  --   --   --   --  1.9  --   PHOS 3.9   < > 5.1*   < > 4.9* 3.2 4.6 4.7* 4.6   < > = values in this interval not displayed.   GFR: Estimated Creatinine Clearance: 65.7 mL/min (by C-G formula based on SCr of 1.08 mg/dL). Liver Function Tests: Recent Labs  Lab 12/20/19 0442 12/24/2019 1241 12/25/19 1840 30-Dec-2019 0709  AST 27 34 44* 39  ALT 14 17 19 20   ALKPHOS 158* 171* 163* 158*  BILITOT 0.4 0.7 1.1 1.4*  PROT 5.6* 6.2* 5.6* 5.5*  ALBUMIN 1.9* 1.9* 1.8* 1.9*   No results for input(s): LIPASE, AMYLASE in the last 168 hours. No results for input(s): AMMONIA in the last 168 hours. Coagulation Profile: No results for input(s): INR, PROTIME in the  last 168 hours. Cardiac Enzymes: No results for input(s): CKTOTAL, CKMB, CKMBINDEX, TROPONINI in the last 168 hours. BNP (last 3 results) No results for input(s): PROBNP in the last 8760 hours. HbA1C: No results for input(s): HGBA1C in the last 72 hours. CBG: Recent Labs  Lab 12/23/19 1223 12/23/19 1614 12/23/19 2029 12/24/19 0020 12/25/19 1732  GLUCAP 119* 125* 143* 112* 107*   Lipid Profile: No results for input(s): CHOL, HDL, LDLCALC, TRIG, CHOLHDL, LDLDIRECT in the last 72 hours. Thyroid Function Tests: Recent Labs    12/25/19 1618  TSH 4.343   Anemia Panel: Recent Labs    12/25/19 1840  RETICCTPCT 0.8   Urine analysis:    Component Value Date/Time   COLORURINE AMBER (A) 12/19/2019 1254   APPEARANCEUR CLEAR 12/17/2019 1254   LABSPEC 1.020 12/11/2019 1254   PHURINE 6.0 12/11/2019 1254   GLUCOSEU NEGATIVE 12/11/2019 1254   HGBUR NEGATIVE 11/29/2019 1254   BILIRUBINUR SMALL (A) 12/19/2019 1254   KETONESUR NEGATIVE 12/07/2019 1254   PROTEINUR NEGATIVE 12/02/2019 1254   NITRITE NEGATIVE 12/07/2019 1254   LEUKOCYTESUR NEGATIVE 12/03/2019 1254   Sepsis Labs: @LABRCNTIP (procalcitonin:4,lacticidven:4)  ) Recent Results (from the past 240 hour(s))  SARS Coronavirus 2 by RT PCR (hospital order, performed in [place] hospital lab) Nasopharyngeal Nasopharyngeal Swab     Status: None   Collection Time: 12/22/2019  3:08 PM   Specimen: Nasopharyngeal Swab  Result Value Ref Range Status   SARS Coronavirus 2 NEGATIVE NEGATIVE Final    Comment: (NOTE) SARS-CoV-2 target nucleic acids are NOT DETECTED.  The SARS-CoV-2 RNA is generally detectable in upper and lower respiratory specimens during the acute phase of infection. The lowest concentration of SARS-CoV-2 viral copies this assay can detect is 250 copies / mL. A negative result does not preclude SARS-CoV-2 infection and should not be used as the sole basis for treatment or other patient management decisions.  A  negative result may occur with improper specimen collection / handling, submission of specimen other than nasopharyngeal swab, presence of viral mutation(s) within the areas targeted by this assay, and inadequate number of viral copies (<250 copies / mL). A negative result must be combined with clinical observations, patient history, and epidemiological information.  Fact Sheet for Patients:   StrictlyIdeas.no  Fact Sheet for Healthcare Providers: BankingDealers.co.za  This test is not  yet approved or  cleared by the [place] and has been authorized for detection and/or diagnosis of SARS-CoV-2 by FDA under an Emergency Use Authorization (EUA).  This EUA will remain in effect (meaning this test can be used) for the duration of the COVID-19 declaration under Section 564(b)(1) of the Act, 21 U.S.C. section 360bbb-3(b)(1), unless the authorization is terminated or revoked sooner.  Performed at [facility], [place] [address], [place], [place] [address]   MRSA PCR Screening     Status: None   Collection Time: 12/18/19 10:25 PM   Specimen: Nasal Mucosa; Nasopharyngeal  Result Value Ref Range Status   MRSA by PCR NEGATIVE NEGATIVE Final    Comment:        The GeneXpert MRSA Assay (FDA approved for NASAL specimens only), is one component of a comprehensive MRSA colonization surveillance program. It is not intended to diagnose MRSA infection nor to guide or monitor treatment for MRSA infections. Performed at [facility], [place] [address] [place], [place] [address]       Studies: No results found.  Scheduled Meds: . (feeding supplement) PROSource Plus  30 mL Oral BID BM  . sodium chloride   Intravenous Once  . amLODipine  10 mg Oral Daily  . atorvastatin  20 mg Oral Daily  . bictegravir-emtricitabine-tenofovir AF  1 tablet Oral Daily  . dronabinol  5 mg Oral BID AC  . feeding supplement (ENSURE ENLIVE)  237 mL  Oral TID BM  . folic acid  1 mg Oral QPM  . metoprolol succinate  37.5 mg Oral QPM  . nystatin  5 mL Oral QID  . polyethylene glycol  17 g Oral Daily  . risperiDONE  2 mg Oral QHS  . sodium chloride flush  10-40 mL Intracatheter Q12H    Continuous Infusions: . dextrose 30 mL/hr at 12/25/19 1739     LOS: 10 days     Kayleen Memos, MD [facility] Pager [phone]  If 7PM-7AM, please contact night-coverage [email or web address] Password [email or web address] 2020/01/09, 1:02 PM

## 2020-01-25 NOTE — Death Summary Note (Signed)
Death Summary  Mike Holland UJW:[id number] DOB: 04-02-1966 DOA: 01/14/20  PCP: Gildardo Pounds, NP  Admit date: 01/14/20 Date of Death: 01/25/20 Time of Death: September 15, 2243 Notification: Gildardo Pounds, NP notified of death of 01/25/2020   History of present illness:  Mike Holland is a 54 y.o. male with a history of HIV 09-15-1994, previous CVA, essential hypertension, chronic depression, dyslipidemia and tachybrady syndrome status post pacemaker presented with 3 days of slurred speech, shuffling gait, weight loss 20 pounds in 1 month, generalized weakness, decreased appetite, and poor oral intake. Neurology was consulted. Further work-up with brain MRI showed suboptimal evaluation due to significant artifact. However, there were extra-axial findings on MRI brain that are suggestive of lymphoma. CT chest and abdomen were obtained which showed widespread neoplastic involvement concerning for lymphoma. Thepatient's HIV+ status puts him at high risk for the development of lymphoma.  Oncology was consulted and followed.  Surgery was consulted and patient underwent excisional biopsy right axillarylymph nodeonJuly 28, Sep 15, 2019.  Lower extremity weakness was further evaluated by MRI of lumbar spine 12/22/19. It revealed the following: Positive for diffuse osseous metastatic disease throughout the entire visible spine and pelvis. Subtle pathologic compression fractures suspected at L4 and L5. Mild extraosseous tumor extension into the left L4 and left S1 neural foramina. No other malignant neural impingement identified. Bulky extraosseous tumor extension from the visible right iliac bone (series 10, image 27). Bulky metastatic lymphadenopathy redemonstrated throughout the retroperitoneum. Superimposed mild degenerative spinal and mild to moderate neural foraminal stenosis at L3-L4.  MRI thoracic spine was also obtained and revealed the following: Diffuse metastatic disease throughout the visible spine and  thorax. Mild pathologic compression fracture of T5.Bulky thoracic paraspinal and intercostal tumor. 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 Neural foraminal involvement also at the bilateral T4, left T5, and left T10 nerve levels.  Hospital course complicated by lethargy, symptomatic anemia, and acute thrombocytopenia.  Pharmacological DVT prophylactic was held and he was placed on SCDs for DVT prophylaxis.  Palliative care medicine was consulted to assist with establishing goals of care.  On 01-24-2020 patient and family made decision for full comfort care.  Mike Holland expired the evening of 2020-01-24 at Sep 15, 2243.   Final Diagnoses:  1. 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   Widespread metastatic malignant neoplastic disease ([place]) Active Problems:   HIV disease ([place])   Essential hypertension   Depression   Unintentional weight loss   HLD (hyperlipidemia)   Pacemaker   Hypokalemia   Hypomagnesemia   Normocytic anemia   Marijuana abuse   Slurred speech   Comfort measures only status   Goals of care, counseling/discussion   DNR (do not resuscitate)   Acute respiratory failure with hypoxia ([place])   Protein-calorie malnutrition, severe   Lymphadenopathy   Dyspnea   Increased oropharyngeal secretions   B-cell lymphoma (HCC)   Anemia in other chronic diseases classified elsewhere   Thrombocytopenia (HCC)   Failure to thrive in adult   Tachy-brady syndrome (HCC)   CVA, old, hemiparesis ([place])   Metastasis to bone [facility])     The results of significant diagnostics from this hospitalization (including imaging, microbiology, ancillary and laboratory) are listed below for reference.    Significant Diagnostic Studies: DG Lumbar Spine Complete  Result Date: 11/28/2019 CLINICAL DATA:  Fall 3  weeks  ago with low back pain EXAM: LUMBAR SPINE - COMPLETE 4+ VIEW COMPARISON:  Chest examination from June of 2021 FINDINGS: No sign of fracture or malalignment in the lumbar spine. Vertebral body heights are maintained with mild disc space narrowing and anterior osteophyte formation greatest at L3-4 and L5-S1. Mild facet arthropathy is also demonstrated. IMPRESSION: 1. No signs of fracture or malalignment. 2. Mild degenerative disc disease in the lower lumbar spine. Electronically Signed   By: Zetta Bills M.D.   On: 11/28/2019 11:37   CT HEAD WO CONTRAST  Result Date: 12/23/2019 CLINICAL DATA:  Slurred speech for the past 3 days with gait disturbance since yesterday. History of 2 previous strokes. EXAM: CT HEAD WITHOUT CONTRAST TECHNIQUE: Contiguous axial images were obtained from the base of the skull through the vertex without intravenous contrast. COMPARISON:  None. FINDINGS: Brain: Mildly enlarged ventricles and cortical sulci. Mild patchy white matter low density in both cerebral hemispheres. Old right corona radiata lacunar infarct. No intracranial hemorrhage, mass lesion or CT evidence of acute infarction. Vascular: No hyperdense vessel or unexpected calcification. Skull: Normal. Negative for fracture or focal lesion. Sinuses/Orbits: Mild to moderate left maxillary sinus mucosal thickening and minimal right maxillary sinus mucosal thickening. Right ethmoid sinus rounded metallic foreign body compatible with a BB. Other: None. IMPRESSION: 1. No acute abnormality. 2. Mild diffuse cerebral and cerebellar atrophy. 3. Mild chronic small vessel white matter ischemic changes in both cerebral hemispheres. 4. Old right corona radiata lacunar infarct. 5. Mild chronic bilateral maxillary sinusitis. 6. Right ethmoid sinus BB. Electronically Signed   By: Claudie Revering M.D.   On: 12/15/2019 12:26   CT Chest W Contrast  Result Date: 12/05/2019 CLINICAL DATA:  Follow-up abnormal chest x-ray suggesting chest wall  mass EXAM: CT CHEST, ABDOMEN, AND PELVIS WITH CONTRAST TECHNIQUE: Multidetector CT imaging of the chest, abdomen and pelvis was performed following the standard protocol during bolus administration of intravenous contrast. CONTRAST:  158mL OMNIPAQUE IOHEXOL 300 MG/ML  SOLN COMPARISON:  Film from earlier in the same day. FINDINGS: CT CHEST FINDINGS Cardiovascular: Atherosclerotic calcifications of the thoracic aorta are noted. No aneurysmal dilatation or dissection is seen. No cardiac enlargement is noted. Pacing device is again seen. The pulmonary artery is enlarged without definitive central filling defect. Mediastinum/Nodes: Thoracic inlet is within normal limits. Scattered small hilar and mediastinal lymph nodes are noted. The esophagus appears within normal limits. Bilateral axillary adenopathy is identified the largest of these on the right measures 12 mm. The largest of these on the left measures 11 mm. Lungs/Pleura: Emphysematous changes are seen. The left lung is clear. The emphysematous changes are more marked on the right. This contributes to the somewhat lucent area seen on recent chest x-ray. No sizable effusion is seen. Enhancing pleural base lesions are seen throughout the right chest adjacent to the anterior aspect of the right fourth rib as well as the posterolateral aspect of the right seventh rib and posterior aspect of the right ninth rib. Given the multiplicity of pleural based lesions, metastatic disease deserves consideration. Musculoskeletal: Pathologic fracture is noted involving the posterolateral aspect of the right seventh rib secondary to the pleural based mass. This corresponds with the finding seen on recent chest x-ray. No other definitive pathologic fracture is seen. The vertebral bodies show no definitive compression deformity. CT ABDOMEN PELVIS FINDINGS Hepatobiliary: No focal liver abnormality is seen. No gallstones, gallbladder wall thickening, or biliary dilatation. Pancreas:  Unremarkable. No pancreatic ductal dilatation or surrounding inflammatory changes. Spleen: Normal  in size without focal abnormality. Adrenals/Urinary Tract: Adrenal glands are within normal limits. Kidneys demonstrate a normal enhancement pattern bilaterally. No renal calculi or obstructive changes are seen. Kidneys demonstrate a normal excretion of contrast bilaterally on delayed images. The bladder is partially decompressed. Stomach/Bowel: Colon shows no obstructive or inflammatory changes. The appendix is well visualized and within normal limits. No small bowel or gastric abnormality is noted. Vascular/Lymphatic: Atherosclerotic calcifications of the abdominal aorta are seen without definitive aneurysmal dilatation. Considerable retroperitoneal adenopathy is noted in the Peri aortic and intra-aortocaval region. The largest area is noted adjacent to aortic bifurcation measuring approximately 4.3 x 1.9 cm in greatest dimension. No significant inguinal adenopathy is noted. Bilateral common iliac adenopathy is seen as well. Reproductive: Prostate is unremarkable. Other: Minimal free fluid is noted which may be physiologic in nature. Musculoskeletal: Degenerative changes of lumbar spine are noted. There is a moth-eaten pattern identified within the right iliac bone adjacent to the sacroiliac joint as well as an area of bony destruction in the left half of the sacrum best seen on image number 105 of series 4. Soft tissue nodule is noted in the right buttock best seen on image number 85 of series 4 which measures 17 mm in greatest dimension. This may be related to the underlying diffuse process although may be incidental. IMPRESSION: Constellation of findings as described above consistent with widespread neoplastic involvement. Given the lymphadenopathy in the possibility of lymphoma is felt to be most likely. Node biopsy would be helpful. Additionally PET-CT imaging is recommended for further evaluation. Electronically  Signed   By: Inez Catalina M.D.   On: 12/20/2019 15:13   CT ANGIO CHEST PE W OR WO CONTRAST  Result Date: 12/19/2019 CLINICAL DATA:  Lymphoma.  Elevated D-dimer.  Dyspnea on exertion. EXAM: CT ANGIOGRAPHY CHEST WITH CONTRAST TECHNIQUE: Multidetector CT imaging of the chest was performed using the standard protocol during bolus administration of intravenous contrast. Multiplanar CT image reconstructions and MIPs were obtained to evaluate the vascular anatomy. CONTRAST:  49mL OMNIPAQUE IOHEXOL 350 MG/ML SOLN COMPARISON:  12/18/2019 chest radiograph. CT of the chest of 11/24/2019. FINDINGS: Cardiovascular: The quality of this exam for evaluation of pulmonary embolism is moderate. Although the bolus is well timed, limitations include mild motion and patient left arm position, not elevated. No pulmonary embolism to the large segmental level. Aortic atherosclerosis. Normal heart size. Dual lead pacer. Pulmonary artery enlargement, suggesting pulmonary arterial hypertension. Lad coronary artery calcification. Mediastinum/Nodes: Small low jugular nodes bilaterally. 1.0 cm right subpectoral node. Bilateral axillary nodes, as on recent diagnostic CT. Example 1.4 cm left axillary node. High right paratracheal 9 mm node on 25/10. Hilar adenopathy is mild at 1.2 cm. Lungs/Pleura: Development of small bilateral pleural effusions since the prior CT. Minimal motion degradation. Right pleural based lesions, including adjacent the anterior fourth and posterior seventh ribs again identified. Upper Abdomen: Normal imaged portions of the liver, spleen, stomach, pancreas, adrenal glands, kidneys. Musculoskeletal: Right chest wall mass, surrounding the fourth anterior right rib, including at on the order of 5.6 x 2.5 cm. Anterior left second rib adjacent soft tissue/pleural thickening is more equivocal on 38/10. Seventh posterior right rib irregularity with underlying lytic lesion including on 79/10. Mild T5 superior endplate  compression deformity is unchanged. Review of the MIP images confirms the above findings. IMPRESSION: 1. No evidence of pulmonary embolism with limitations above. Pulmonary artery enlargement suggests pulmonary arterial hypertension. 2. Since the prior CT of 12/18/2019, development of small bilateral pleural effusions. 3.  Thoracic adenopathy and right sided pleural/chest wall lesions as detailed above. 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 4. Aortic Atherosclerosis (ICD10-I70.0) and Emphysema (ICD10-J43.9). Electronically Signed   By: Abigail Miyamoto M.D.   On: 12/19/2019 17:09   MR BRAIN W WO CONTRAST  Result Date: 12/19/2019 CLINICAL DATA:  Metastatic disease staging EXAM: MRI HEAD WITHOUT AND WITH CONTRAST TECHNIQUE: Multiplanar, multiecho pulse sequences of the brain and surrounding structures were obtained without and with intravenous contrast. CONTRAST:  91mL GADAVIST GADOBUTROL 1 MMOL/ML IV SOLN COMPARISON:  None. FINDINGS: There is significant susceptibility artifact from BB within the ethmoid sinus. There is variable obscuration of brain parenchyma on different sequences. Susceptibility weighted imaging is nondiagnostic. Below findings are within this limitation. Brain: There is no abnormal diffusion signal. Prominence of the ventricles and sulci reflects generalized parenchymal volume loss. There is a chronic small vessel infarct of the right basal ganglia and adjacent white matter. There is no intracranial mass, mass effect, or edema. There is no hydrocephalus or extra-axial fluid collection. No abnormal enhancement. Vascular: Major vessel flow voids at the skull base are preserved. Skull and upper cervical spine: Decreased T1 marrow signal. Sinuses/Orbits: Paranasal sinuses are aerated. Orbits are unremarkable. Other: Sella is unremarkable. Left mastoid effusion. More patchy mild right mastoid fluid opacification. There is abnormal soft tissue thickening at the posterior  nasopharyngeal wall eccentric to the left responsible for asymmetric mastoid opacification. IMPRESSION: Suboptimal evaluation due to significant artifact from ethmoid sinus BB. Abnormal soft tissue thickening of the posterior nasopharyngeal wall eccentric to the left with associated left mastoid effusion. Given findings on prior imaging, may reflect lymphomatous involvement. Abnormal marrow signal, which could reflect hematopoietic marrow or lymphomatous involvement. Electronically Signed   By: Macy Mis M.D.   On: 12/19/2019 13:17   MR THORACIC SPINE W WO CONTRAST  Addendum Date: 12/23/2019   ADDENDUM REPORT: 12/23/2019 17:35 ADDENDUM: Study discussed by telephone with Dr. Kerney Elbe on 12/23/2019 at 17:33 . Electronically Signed   By: Genevie Ann M.D.   On: 12/23/2019 17:35   Result Date: 12/23/2019 CLINICAL DATA:  54 year old male with malignant appearing lymphadenopathy, metastatic disease on recent chest and body CT. Working diagnosis of lymphoma versus metastatic disease unknown primary. Diffuse osseous metastatic disease throughout the lumbar spine and visible pelvis on lumbar MRI yesterday. Weakness. EXAM: MRI THORACIC WITHOUT AND WITH CONTRAST TECHNIQUE: Multiplanar and multiecho pulse sequences of the thoracic spine were obtained without and with intravenous contrast. CONTRAST:  75mL GADAVIST GADOBUTROL 1 MMOL/ML IV SOLN COMPARISON:  Lumbar MRI 12/22/2019. FINDINGS: Limited cervical spine imaging: Diffuse marrow replacement in the cervical spine similar to that elsewhere. No definite malignant cervical spinal stenosis. Thoracic spine segmentation: Appears to be normal, the same numbering system as on the lumbar MRI. Alignment:  Preserved thoracic kyphosis. Vertebrae: Diffuse abnormal marrow signal throughout the thoracic spine and visible ribs. Mild pathologic fracture of T5. Superior endplate compression with minimal posterior retropulsion. However, there is bulky epidural and paraspinal tumor at  T5 greater on the left (series 18, image 16) resulting in spinal stenosis with mild cord compression (series 19, image 16). No spinal cord edema is evident. The spinal cord is also effaced by epidural tumor at T4. And there is bilateral T4 and left T5 neural foraminal involvement. Thoracic epidural tumor is present from the T3 to the superior T6 level (series 17, image 11). And bulky left greater than right paraspinal tumor is present throughout the upper thoracic spine and contiguous with similar  bulky and nodular bilateral intercostal tumor (series 19, image 10). Furthermore, in the lower thoracic spine there is bulky left paraspinal tumor at the T10 level with involvement of the left T10 neural foramen (series 18, image 30 and series 19, image 31). Cord: Cord compression at T4 and T5 as stated above. No definite cord signal abnormality. Fairly capacious thoracic thecal sac at the unaffected levels. No abnormal intradural enhancement. No dural thickening. Paraspinal and other soft tissues: Abnormal thoracic paraspinal and intercostal soft tissues as stated above. Additionally there is evidence of lymphadenopathy at the visible thoracic inlet (series 20 image 2). Small pleural effusions are present greater on the left. Disc levels: No significant superimposed degenerative changes. IMPRESSION: 1. Diffuse metastatic disease throughout the visible spine and thorax. Mild pathologic compression fracture of T5. 2. Bulky thoracic paraspinal and intercostal tumor. 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 Neural foraminal involvement also at the bilateral T4, left T5, and left T10 nerve levels. 3. Small pleural effusions greater on the left. Electronically Signed: By: Genevie Ann M.D. On: 12/23/2019 17:24   MR Lumbar Spine W Wo Contrast  Result Date: 12/22/2019 CLINICAL DATA:  54 year old male with malignant appearing lymphadenopathy, metastatic disease on recent chest and body  CT. Working diagnosis of lymphoma versus metastatic disease unknown primary. EXAM: MRI LUMBAR SPINE WITHOUT AND WITH CONTRAST TECHNIQUE: Multiplanar and multiecho pulse sequences of the lumbar spine were obtained without and with intravenous contrast. CONTRAST:  31mL GADAVIST GADOBUTROL 1 MMOL/ML IV SOLN COMPARISON:  CT Chest, Abdomen, and Pelvis 12/24/2019 FINDINGS: Segmentation:  Normal on the recent CTs. Alignment:  Preserved lumbar lordosis. Vertebrae: Diffusely abnormal marrow signal throughout the visible spine and pelvis with diffuse heterogeneous abnormal marrow enhancement following contrast. Mild pathologic compression fractures suspected at L4 and L5. No retropulsion. There is bulky extraosseous extension of tumor along both sides of the medial right iliac wing on series 10, image 27. See additional lumbar level details below. Conus medullaris and cauda equina: Conus extends to the T12-L1 level. No lower spinal cord or conus signal abnormality. No abnormal intradural enhancement. No dural thickening. Paraspinal and other soft tissues: Bulky lymphadenopathy throughout the retroperitoneum again noted, with nodal conglomeration is measuring up to 39 mm axial dimension. Bulky extraosseous extension of tumor from the right iliac wing as stated above. Posterior paraspinal soft tissues appear to remain normal. Partially visible trace free fluid in the pelvis. Disc levels: Mild for age superimposed lumbar spine degeneration. There is multifactorial mild spinal and mild to moderate neural foraminal stenosis at L3-L4. No levels of lower thoracic epidural extension of tumor. In the lumbar spine there is focal extraosseous extension of tumor at the left L4 neural foramen (series 11, image 23) although no definite lumbar epidural space involvement at that level. In the sacral spine there is epidural tumor involvement at the left S1 foramen. IMPRESSION: 1. Positive for diffuse osseous metastatic disease throughout the  entire visible spine and pelvis. 2. Subtle pathologic compression fractures suspected at L4 and L5. Mild extraosseous tumor extension into the left L4 and left S1 neural foramina. But no other malignant neural impingement identified. 3. Bulky extraosseous tumor extension from the visible right iliac bone (series 10, image 27). 4. Bulky metastatic lymphadenopathy redemonstrated throughout the retroperitoneum. 5. Superimposed mild degenerative spinal and mild to moderate neural foraminal stenosis at L3-L4. Electronically Signed   By: Genevie Ann M.D.   On: 12/22/2019 15:21   CT ABDOMEN PELVIS W  CONTRAST  Result Date: 12/23/2019 CLINICAL DATA:  Follow-up abnormal chest x-ray suggesting chest wall mass EXAM: CT CHEST, ABDOMEN, AND PELVIS WITH CONTRAST TECHNIQUE: Multidetector CT imaging of the chest, abdomen and pelvis was performed following the standard protocol during bolus administration of intravenous contrast. CONTRAST:  116mL OMNIPAQUE IOHEXOL 300 MG/ML  SOLN COMPARISON:  Film from earlier in the same day. FINDINGS: CT CHEST FINDINGS Cardiovascular: Atherosclerotic calcifications of the thoracic aorta are noted. No aneurysmal dilatation or dissection is seen. No cardiac enlargement is noted. Pacing device is again seen. The pulmonary artery is enlarged without definitive central filling defect. Mediastinum/Nodes: Thoracic inlet is within normal limits. Scattered small hilar and mediastinal lymph nodes are noted. The esophagus appears within normal limits. Bilateral axillary adenopathy is identified the largest of these on the right measures 12 mm. The largest of these on the left measures 11 mm. Lungs/Pleura: Emphysematous changes are seen. The left lung is clear. The emphysematous changes are more marked on the right. This contributes to the somewhat lucent area seen on recent chest x-ray. No sizable effusion is seen. Enhancing pleural base lesions are seen throughout the right chest adjacent to the anterior  aspect of the right fourth rib as well as the posterolateral aspect of the right seventh rib and posterior aspect of the right ninth rib. Given the multiplicity of pleural based lesions, metastatic disease deserves consideration. Musculoskeletal: Pathologic fracture is noted involving the posterolateral aspect of the right seventh rib secondary to the pleural based mass. This corresponds with the finding seen on recent chest x-ray. No other definitive pathologic fracture is seen. The vertebral bodies show no definitive compression deformity. CT ABDOMEN PELVIS FINDINGS Hepatobiliary: No focal liver abnormality is seen. No gallstones, gallbladder wall thickening, or biliary dilatation. Pancreas: Unremarkable. No pancreatic ductal dilatation or surrounding inflammatory changes. Spleen: Normal in size without focal abnormality. Adrenals/Urinary Tract: Adrenal glands are within normal limits. Kidneys demonstrate a normal enhancement pattern bilaterally. No renal calculi or obstructive changes are seen. Kidneys demonstrate a normal excretion of contrast bilaterally on delayed images. The bladder is partially decompressed. Stomach/Bowel: Colon shows no obstructive or inflammatory changes. The appendix is well visualized and within normal limits. No small bowel or gastric abnormality is noted. Vascular/Lymphatic: Atherosclerotic calcifications of the abdominal aorta are seen without definitive aneurysmal dilatation. Considerable retroperitoneal adenopathy is noted in the Peri aortic and intra-aortocaval region. The largest area is noted adjacent to aortic bifurcation measuring approximately 4.3 x 1.9 cm in greatest dimension. No significant inguinal adenopathy is noted. Bilateral common iliac adenopathy is seen as well. Reproductive: Prostate is unremarkable. Other: Minimal free fluid is noted which may be physiologic in nature. Musculoskeletal: Degenerative changes of lumbar spine are noted. There is a moth-eaten pattern  identified within the right iliac bone adjacent to the sacroiliac joint as well as an area of bony destruction in the left half of the sacrum best seen on image number 105 of series 4. Soft tissue nodule is noted in the right buttock best seen on image number 85 of series 4 which measures 17 mm in greatest dimension. This may be related to the underlying diffuse process although may be incidental. IMPRESSION: Constellation of findings as described above consistent with widespread neoplastic involvement. Given the lymphadenopathy in the possibility of lymphoma is felt to be most likely. Node biopsy would be helpful. Additionally PET-CT imaging is recommended for further evaluation. Electronically Signed   By: Inez Catalina M.D.   On: 11/24/2019 15:13   DG CHEST  PORT 1 VIEW  Result Date: 12/18/2019 CLINICAL DATA:  Shortness of breath, history hypertension, HIV EXAM: PORTABLE CHEST 1 VIEW COMPARISON:  Portable exam 1403 hours compared to 12/04/2019 Correlation: CT chest 12/23/2019 FINDINGS: LEFT subclavian sequential transvenous pacemaker leads project at RIGHT atrium and RIGHT ventricle. Normal heart size and pulmonary vascularity. BILATERAL hilar enlargement.  Mild bibasilar atelectasis. Upper lungs clear. No pleural effusion or pneumothorax. Extrapleural density at lateral lower RIGHT chest, by CT corresponding to rib lesions as noted on prior CT. IMPRESSION: Bibasilar atelectasis. BILATERAL hilar and AP window enlargement, corresponding to enlarged pulmonary arteries on prior CT question pulmonary arterial hypertension. Electronically Signed   By: Lavonia Dana M.D.   On: 12/18/2019 14:31   DG Chest Portable 1 View  Result Date: 11/26/2019 CLINICAL DATA:  54 year old male with history of shortness of breath. EXAM: PORTABLE CHEST 1 VIEW COMPARISON:  Chest x-ray 11/04/2019. FINDINGS: Previously noted right upper lobe nodule is less apparent on today's examination. There is an increasing vague masslike density  projecting over the lateral aspect of the right mid hemithorax. Probable expansile lytic lesion in the posterolateral aspect of the right seventh rib where there is a nondisplaced fracture. Left lung is clear. No pleural effusions. No evidence of pulmonary edema. Heart size is normal. Upper mediastinal contours are within normal limits. 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 IMPRESSION: 1. Unusual findings in the right hemithorax concerning for potential malignancy. Specifically, there appears to be an expansile lytic lesion in the posterolateral right seventh rib with probable nondisplaced pathologic fracture and adjacent ill-defined opacity in the right mid hemithorax. These findings should be better evaluated with follow-up contrast enhanced chest CT in the near future to exclude underlying malignancy. Electronically Signed   By: Vinnie Langton M.D.   On: 12/06/2019 12:43   ECHOCARDIOGRAM COMPLETE  Result Date: 05-Jan-2020    ECHOCARDIOGRAM REPORT   Patient Name:   NASIAH LEHENBAUER Date of Exam: 01-05-2020 Medical Rec #:  [id number]        Height:       67.0 in Accession #:    [id number]       Weight:       129.4 lb Date of Birth:  07-21-1965         BSA:          1.681 m Patient Age:    63 years         BP:           93/69 mmHg Patient Gender: M                HR:           123 bpm. Exam Location:  Inpatient Procedure: 2D Echo, Cardiac Doppler and Color Doppler Indications:    R94.31 Abnormal EKG  History:        Patient has no prior history of Echocardiogram examinations.                 Pacemaker; Risk Factors:Hypertension. HIV.  Sonographer:    Tiffany Dance Referring Phys: [id number] Kayleen Memos  Sonographer Comments: Technically difficult study due to poor echo windows and suboptimal apical window. IMPRESSIONS  1. Left ventricular ejection fraction, by estimation, is 65 to 70%. The left ventricle has normal function.  The left ventricle has no regional wall motion abnormalities. Left ventricular diastolic parameters were grossly normal (medial e' tissue Doppler  10  cm/s).  2. Right ventricular systolic function is grossly normal with mild hypokinesis of RV apex. The right ventricular size is normal. There is moderately elevated pulmonary artery systolic pressure. The estimated right ventricular systolic pressure is 37.9 mmHg.  3. The mitral valve is normal in structure. No evidence of mitral valve regurgitation. No evidence of mitral stenosis.  4. Tricuspid valve regurgitation is moderate.  5. The aortic valve is abnormal. Aortic valve regurgitation is not visualized. No aortic stenosis is present.  6. The inferior vena cava is normal in size with greater than 50% respiratory variability, suggesting right atrial pressure of 3 mmHg. FINDINGS  Left Ventricle: Left ventricular ejection fraction, by estimation, is 65 to 70%. The left ventricle has normal function. The left ventricle has no regional wall motion abnormalities. The left ventricular internal cavity size was normal in size. There is  no left ventricular hypertrophy. Left ventricular diastolic parameters were normal. Right Ventricle: The right ventricular size is normal. No increase in right ventricular wall thickness. Right ventricular systolic function is grossly normal with mild hypokinesis of RV apex. There is moderately elevated pulmonary artery systolic pressure. The tricuspid regurgitant velocity is 3.26 m/s, and with an assumed right atrial pressure of 3 mmHg, the estimated right ventricular systolic pressure is 02.4 mmHg. Left Atrium: Left atrial size was normal in size. Right Atrium: Right atrial size was normal in size. Pericardium: Trivial pericardial effusion is present. Mitral Valve: The mitral valve is normal in structure. Normal mobility of the mitral valve leaflets. No evidence of mitral valve regurgitation. No evidence of mitral valve stenosis. Tricuspid  Valve: The tricuspid valve is normal in structure. Tricuspid valve regurgitation is moderate . No evidence of tricuspid stenosis. Aortic Valve: The aortic valve is abnormal. Aortic valve regurgitation is not visualized. No aortic stenosis is present. There is moderate calcification of the aortic valve. Pulmonic Valve: The pulmonic valve was normal in structure. Pulmonic valve regurgitation is not visualized. No evidence of pulmonic stenosis. Aorta: The aortic root is normal in size and structure. Venous: The inferior vena cava is normal in size with greater than 50% respiratory variability, suggesting right atrial pressure of 3 mmHg. IAS/Shunts: The interatrial septum was not well visualized. Additional Comments: A pacer wire is visualized in the right atrium and right ventricle.  LEFT VENTRICLE PLAX 2D LVIDd:         4.80 cm  Diastology LVIDs:         2.90 cm  LV e' lateral: 12.90 cm/s LV PW:         0.70 cm  LV e' medial:  10.60 cm/s LV IVS:        0.70 cm LVOT diam:     1.80 cm LV SV:         37 LV SV Index:   22 LVOT Area:     2.54 cm  RIGHT VENTRICLE             IVC RV Basal diam:  2.90 cm     IVC diam: 1.10 cm RV S prime:     18.80 cm/s TAPSE (M-mode): 1.7 cm LEFT ATRIUM             Index       RIGHT ATRIUM           Index LA diam:        2.20 cm 1.31 cm/m  RA Area:     12.40 cm LA Vol (A2C):   22.1 ml 13.15 ml/m  RA Volume:   30.10 ml  17.91 ml/m LA Vol (A4C):   29.3 ml 17.43 ml/m LA Biplane Vol: 25.7 ml 15.29 ml/m  AORTIC VALVE LVOT Vmax:   88.60 cm/s LVOT Vmean:  60.700 cm/s LVOT VTI:    0.144 m  AORTA Ao Root diam: 3.10 cm Ao Asc diam:  2.70 cm MV A velocity: 76.10 cm/s  TRICUSPID VALVE                            TR Peak grad:   42.5 mmHg                            TR Vmax:        326.00 cm/s                             SHUNTS                            Systemic VTI:  0.14 m                            Systemic Diam: 1.80 cm Cherlynn Kaiser MD Electronically signed by Cherlynn Kaiser MD Signature  Date/Time: 2020/01/19/5:19:31 PM    Final    VAS [place] LOWER EXTREMITY VENOUS (DVT)  Result Date: 12/20/2019  Lower Venous DVTStudy Indications: Metastatic neoplasm, elevated d-dimer.  Comparison Study: No prior. Performing Technologist: Oda Cogan RDMS, RVT  Examination Guidelines: A complete evaluation includes B-mode imaging, spectral Doppler, color Doppler, and power Doppler as needed of all accessible portions of each vessel. Bilateral testing is considered an integral part of a complete examination. Limited examinations for reoccurring indications may be performed as noted. The reflux portion of the exam is performed with the patient in reverse Trendelenburg.  +---------+---------------+---------+-----------+----------+--------------+ RIGHT    CompressibilityPhasicitySpontaneityPropertiesThrombus Aging +---------+---------------+---------+-----------+----------+--------------+ CFV      Full           Yes      Yes                                 +---------+---------------+---------+-----------+----------+--------------+ SFJ      Full                                                        +---------+---------------+---------+-----------+----------+--------------+ FV Prox  Full                                                        +---------+---------------+---------+-----------+----------+--------------+ FV Mid   Full                                                        +---------+---------------+---------+-----------+----------+--------------+ FV DistalFull                                                        +---------+---------------+---------+-----------+----------+--------------+  PFV      Full                                                        +---------+---------------+---------+-----------+----------+--------------+ POP      Full           Yes      Yes                                  +---------+---------------+---------+-----------+----------+--------------+ PTV      Full                                                        +---------+---------------+---------+-----------+----------+--------------+ PERO     Full                                                        +---------+---------------+---------+-----------+----------+--------------+   +---------+---------------+---------+-----------+----------+--------------+ LEFT     CompressibilityPhasicitySpontaneityPropertiesThrombus Aging +---------+---------------+---------+-----------+----------+--------------+ CFV      Full           Yes      Yes                                 +---------+---------------+---------+-----------+----------+--------------+ SFJ      Full                                                        +---------+---------------+---------+-----------+----------+--------------+ FV Prox  Full                                                        +---------+---------------+---------+-----------+----------+--------------+ FV Mid   Full                                                        +---------+---------------+---------+-----------+----------+--------------+ FV DistalFull                                                        +---------+---------------+---------+-----------+----------+--------------+ PFV      Full                                                        +---------+---------------+---------+-----------+----------+--------------+  POP      Full           Yes      Yes                                 +---------+---------------+---------+-----------+----------+--------------+ PTV      Full                                                        +---------+---------------+---------+-----------+----------+--------------+ PERO     Full                                                         +---------+---------------+---------+-----------+----------+--------------+     Summary: BILATERAL: - No evidence of deep vein thrombosis seen in the lower extremities, bilaterally. -No evidence of popliteal cyst, bilaterally.   *See table(s) above for measurements and observations. Electronically signed by Deitra Mayo MD on 12/20/2019 at 2:48:54 PM.    Final     Microbiology: Recent Results (from the past 240 hour(s))  MRSA PCR Screening     Status: None   Collection Time: 12/18/19 10:25 PM   Specimen: Nasal Mucosa; Nasopharyngeal  Result Value Ref Range Status   MRSA by PCR NEGATIVE NEGATIVE Final    Comment:        The GeneXpert MRSA Assay (FDA approved for NASAL specimens only), is one component of a comprehensive MRSA colonization surveillance program. It is not intended to diagnose MRSA infection nor to guide or monitor treatment for MRSA infections. Performed at [facility], [place] [address], [place], [place] [address]      Labs: Basic Metabolic Panel: Recent Labs  Lab 12/12/2019 1241 12/11/2019 1241 12/22/19 0401 12/22/19 0401 12/23/19 0406 12/24/19 0702 12/25/19 0412 12-Jan-2020 0709  NA 134*  --  138  --   --   --  138 149*  K 4.4   < > 4.7   < >  --   --  4.3 3.7  CL 95*  --  99  --   --   --  99 107  CO2 25  --  23  --   --   --  22 23  GLUCOSE 132*  --  105*  --   --   --  97 127*  BUN 17  --  25*  --   --   --  25* 38*  CREATININE 0.80  --  0.83  --   --   --  0.90 1.08  CALCIUM 10.7*  --  10.8*  --   --   --  10.8* 10.9*  MG 1.6*  --   --   --   --   --  1.9  --   PHOS 5.1*   < > 4.9*  --  3.2 4.6 4.7* 4.6   < > = values in this interval not displayed.   Liver Function Tests: Recent Labs  Lab 12/10/2019 1241 12/25/19 1840 Jan 12, 2020 0709  AST 34 44* 39  ALT 17 19 20   ALKPHOS 171* 163* 158*  BILITOT 0.7 1.1 1.4*  PROT 6.2* 5.6* 5.5*  ALBUMIN 1.9* 1.8* 1.9*   No results for input(s): LIPASE, AMYLASE in the last 168 hours. No results for  input(s): AMMONIA in the last 168 hours. CBC: Recent Labs  Lab 11/26/2019 1241 12/07/2019 1241 12/22/19 0401 12/24/19 0702 12/25/19 0412 12/25/19 1840 December 28, 2019 0709  WBC 5.5   < > 5.3 4.7 5.9 6.8 5.7  NEUTROABS 3.4  --  2.9  --   --  3.3  --   HGB 8.5*   < > 8.3* 7.5* 7.5* 7.6* 10.7*  HCT 27.0*   < > 26.5* 23.6* 24.5* 25.1* 33.4*  MCV 91.8   < > 92.7 91.1 93.2 94.0 88.1  PLT 55*   < > 52* 28* 27* 25* 20*   < > = values in this interval not displayed.   Cardiac Enzymes: No results for input(s): CKTOTAL, CKMB, CKMBINDEX, TROPONINI in the last 168 hours. D-Dimer No results for input(s): DDIMER in the last 72 hours. BNP: Invalid input(s): POCBNP CBG: Recent Labs  Lab 12/23/19 1223 12/23/19 1614 12/23/19 2029 12/24/19 0020 12/25/19 1732  GLUCAP 119* 125* 143* 112* 107*   Anemia work up Recent Labs    12/25/19 1840  RETICCTPCT 0.8   Urinalysis    Component Value Date/Time   COLORURINE AMBER (A) 12/05/2019 1254   APPEARANCEUR CLEAR 12/19/2019 1254   LABSPEC 1.020 12/23/2019 1254   PHURINE 6.0 12/17/2019 1254   GLUCOSEU NEGATIVE 12/07/2019 1254   HGBUR NEGATIVE 11/27/2019 1254   BILIRUBINUR SMALL (A) 12/22/2019 1254   KETONESUR NEGATIVE 12/18/2019 1254   PROTEINUR NEGATIVE 12/04/2019 1254   NITRITE NEGATIVE 12/13/2019 1254   LEUKOCYTESUR NEGATIVE 11/27/2019 1254   Sepsis Labs Invalid input(s): PROCALCITONIN,  WBC,  LACTICIDVEN     SIGNED:  Kayleen Memos, MD  [facility] 12/29/2019, 4:13 PM Pager   If 7PM-7AM, please contact night-coverage [email or web address] Password [email or web address]

## 2020-01-25 NOTE — Progress Notes (Signed)
Patient ID: Mike Holland, male   DOB: 07/03/1965, 54 y.o.   MRN: [id number]  This NP visited patient at the bedside as a follow up for palliative medicine needs and emotional support.  Patient was initially seen by the palliative medicine team on December 17, 2019.  Patient has had significant medical decline.  Today he is minimally responsive, tachypneic, tachycardic and he is hypotensive.  He has audible throat secretions, weak cough unable to clear.  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  Education offered regarding the natural trajectory and expectations at end of life.  Patient's niece/main decision maker and his sister are at bedside.  Both family members share a sense of knowing that this is an end-of-life situation and their hope at this time for the patient is comfort and dignity.  Education offered regarding Natural trajectory and expectations at EOL   Plan of care -DNR/DNI -No artificial feeding or hydration now or in the future -No further diagnostics, no escalation of care -None for feeds as tolerated -Symptom management to enhance comfort     Morphine IV 2 mg every 1 hour as needed for pain or dyspnea     Ativan IV 1 mg every 4 hours as needed for anxiety      Robinul IV 0.4 mcg 4 times daily for terminal secretions -Chaplain for spiritual and emotional support -expect hospital death, prognosis is likely hours       Questions and concerns addressed   Discussed with Dr Nevada Crane via secure chat and bedside RN  Total time spent on the unit was 35 minutes  Greater than 50% of the time was spent in counseling and coordination of care  Wadie Lessen NP  Palliative Medicine Team Team Phone # [phone] Pager [phone]

## 2020-01-25 NOTE — Progress Notes (Signed)
This chaplain responded to PMT consult for EOL spiritual care.  The Pt. RN-Kim informed the chaplain the Pt. family may be returning later today. The chaplain prayed with the Pt. on this visit and is available for F/U spiritual care as needed.

## 2020-01-25 NOTE — Progress Notes (Signed)
HEMATOLOGY-ONCOLOGY PROGRESS NOTE  Hematological/Oncological History # Diffuse Lymphadenopathy, Concerning for Lymphoma 1) 11/24/2019: presented to the ED with symptoms of CVA. No CVA found, though CXR showed concern for adenopathy. CT C/A/P show diffuse lymphadenopathy including the axillary, hilar, and retroperitoneal lymph nodes, concerning for lymphoma 2) 12/17/2019: establish care with Dr. Lorenso Courier   SUBJECTIVE: Events from this weekend have been noted.  The patient was more lethargic and tachycardic.  He also had a significant drop in his hemoglobin and platelet count as well as a significantly elevated LDH.  He received 2 units PRBCs overnight.  He has continued to decline and palliative care met with the patient and family just prior to my visit and has transitioned the patient to comfort measures.  The family had left the room before I was able to meet with him.  The patient is sleeping quietly.  He appears comfortable.  Did not awaken to answer questions.  REVIEW OF SYSTEMS:   Unable to obtain secondary to patient lethargy.  I have reviewed the past medical history, past surgical history, social history and family history with the patient and they are unchanged from previous note.   PHYSICAL EXAMINATION: ECOG PERFORMANCE STATUS: 4 - Bedbound  Vitals:   Jan 09, 2020 0545 09-Jan-2020 0739  BP: 93/69 112/76  Pulse: (!) 110 100  Resp: (!) 24 (!) 24  Temp: 97.9 F (36.6 C) 98.9 F (37.2 C)  SpO2: 95% 98%   Filed Weights   12/23/19 0500 12/24/19 0337 12/25/19 0425  Weight: 58.9 kg 60.4 kg 58.7 kg    Intake/Output from previous day: 08/01 0701 - 08/02 0700 In: 942 [P.O.:600; Blood:342] Out: 1075 [Urine:1075]  GENERAL: Lethargic, chronically ill-appearing, appears comfortable SKIN: skin color, texture, turgor are normal, no rashes or significant lesions NEURO: Lethargic  LABORATORY DATA:  I have reviewed the data as listed CMP Latest Ref Rng & Units 01-09-2020 12/25/2019 12/22/2019   Glucose 70 - 99 mg/dL 127(H) 97 105(H)  BUN 6 - 20 mg/dL 38(H) 25(H) 25(H)  Creatinine 0.61 - 1.24 mg/dL 1.08 0.90 0.83  Sodium 135 - 145 mmol/L 149(H) 138 138  Potassium 3.5 - 5.1 mmol/L 3.7 4.3 4.7  Chloride 98 - 111 mmol/L 107 99 99  CO2 22 - 32 mmol/L '23 22 23  '$ Calcium 8.9 - 10.3 mg/dL 10.9(H) 10.8(H) 10.8(H)  Total Protein 6.5 - 8.1 g/dL 5.5(L) 5.6(L) -  Total Bilirubin 0.3 - 1.2 mg/dL 1.4(H) 1.1 -  Alkaline Phos 38 - 126 U/L 158(H) 163(H) -  AST 15 - 41 U/L 39 44(H) -  ALT 0 - 44 U/L 20 19 -    Lab Results  Component Value Date   WBC 5.7 01-09-2020   HGB 10.7 (L) 01-09-20   HCT 33.4 (L) 01-09-2020   MCV 88.1 2020/01/09   PLT 20 (LL) 01/09/20   NEUTROABS 3.3 12/25/2019    DG Lumbar Spine Complete  Result Date: 11/28/2019 CLINICAL DATA:  Fall 3 weeks ago with low back pain EXAM: LUMBAR SPINE - COMPLETE 4+ VIEW COMPARISON:  Chest examination from June of 2021 FINDINGS: No sign of fracture or malalignment in the lumbar spine. Vertebral body heights are maintained with mild disc space narrowing and anterior osteophyte formation greatest at L3-4 and L5-S1. Mild facet arthropathy is also demonstrated. IMPRESSION: 1. No signs of fracture or malalignment. 2. Mild degenerative disc disease in the lower lumbar spine. Electronically Signed   By: Zetta Bills M.D.   On: 11/28/2019 11:37   CT HEAD WO CONTRAST  Result Date: 12/20/2019 CLINICAL DATA:  Slurred speech for the past 3 days with gait disturbance since yesterday. History of 2 previous strokes. EXAM: CT HEAD WITHOUT CONTRAST TECHNIQUE: Contiguous axial images were obtained from the base of the skull through the vertex without intravenous contrast. COMPARISON:  None. FINDINGS: Brain: Mildly enlarged ventricles and cortical sulci. Mild patchy white matter low density in both cerebral hemispheres. Old right corona radiata lacunar infarct. No intracranial hemorrhage, mass lesion or CT evidence of acute infarction. Vascular: No  hyperdense vessel or unexpected calcification. Skull: Normal. Negative for fracture or focal lesion. Sinuses/Orbits: Mild to moderate left maxillary sinus mucosal thickening and minimal right maxillary sinus mucosal thickening. Right ethmoid sinus rounded metallic foreign body compatible with a BB. Other: None. IMPRESSION: 1. No acute abnormality. 2. Mild diffuse cerebral and cerebellar atrophy. 3. Mild chronic small vessel white matter ischemic changes in both cerebral hemispheres. 4. Old right corona radiata lacunar infarct. 5. Mild chronic bilateral maxillary sinusitis. 6. Right ethmoid sinus BB. Electronically Signed   By: Claudie Revering M.D.   On: 12/03/2019 12:26   CT Chest W Contrast  Result Date: 11/26/2019 CLINICAL DATA:  Follow-up abnormal chest x-ray suggesting chest wall mass EXAM: CT CHEST, ABDOMEN, AND PELVIS WITH CONTRAST TECHNIQUE: Multidetector CT imaging of the chest, abdomen and pelvis was performed following the standard protocol during bolus administration of intravenous contrast. CONTRAST:  155m OMNIPAQUE IOHEXOL 300 MG/ML  SOLN COMPARISON:  Film from earlier in the same day. FINDINGS: CT CHEST FINDINGS Cardiovascular: Atherosclerotic calcifications of the thoracic aorta are noted. No aneurysmal dilatation or dissection is seen. No cardiac enlargement is noted. Pacing device is again seen. The pulmonary artery is enlarged without definitive central filling defect. Mediastinum/Nodes: Thoracic inlet is within normal limits. Scattered small hilar and mediastinal lymph nodes are noted. The esophagus appears within normal limits. Bilateral axillary adenopathy is identified the largest of these on the right measures 12 mm. The largest of these on the left measures 11 mm. Lungs/Pleura: Emphysematous changes are seen. The left lung is clear. The emphysematous changes are more marked on the right. This contributes to the somewhat lucent area seen on recent chest x-ray. No sizable effusion is seen.  Enhancing pleural base lesions are seen throughout the right chest adjacent to the anterior aspect of the right fourth rib as well as the posterolateral aspect of the right seventh rib and posterior aspect of the right ninth rib. Given the multiplicity of pleural based lesions, metastatic disease deserves consideration. Musculoskeletal: Pathologic fracture is noted involving the posterolateral aspect of the right seventh rib secondary to the pleural based mass. This corresponds with the finding seen on recent chest x-ray. No other definitive pathologic fracture is seen. The vertebral bodies show no definitive compression deformity. CT ABDOMEN PELVIS FINDINGS Hepatobiliary: No focal liver abnormality is seen. No gallstones, gallbladder wall thickening, or biliary dilatation. Pancreas: Unremarkable. No pancreatic ductal dilatation or surrounding inflammatory changes. Spleen: Normal in size without focal abnormality. Adrenals/Urinary Tract: Adrenal glands are within normal limits. Kidneys demonstrate a normal enhancement pattern bilaterally. No renal calculi or obstructive changes are seen. Kidneys demonstrate a normal excretion of contrast bilaterally on delayed images. The bladder is partially decompressed. Stomach/Bowel: Colon shows no obstructive or inflammatory changes. The appendix is well visualized and within normal limits. No small bowel or gastric abnormality is noted. Vascular/Lymphatic: Atherosclerotic calcifications of the abdominal aorta are seen without definitive aneurysmal dilatation. Considerable retroperitoneal adenopathy is noted in the Peri aortic and intra-aortocaval region. The  largest area is noted adjacent to aortic bifurcation measuring approximately 4.3 x 1.9 cm in greatest dimension. No significant inguinal adenopathy is noted. Bilateral common iliac adenopathy is seen as well. Reproductive: Prostate is unremarkable. Other: Minimal free fluid is noted which may be physiologic in nature.  Musculoskeletal: Degenerative changes of lumbar spine are noted. There is a moth-eaten pattern identified within the right iliac bone adjacent to the sacroiliac joint as well as an area of bony destruction in the left half of the sacrum best seen on image number 105 of series 4. Soft tissue nodule is noted in the right buttock best seen on image number 85 of series 4 which measures 17 mm in greatest dimension. This may be related to the underlying diffuse process although may be incidental. IMPRESSION: Constellation of findings as described above consistent with widespread neoplastic involvement. Given the lymphadenopathy in the possibility of lymphoma is felt to be most likely. Node biopsy would be helpful. Additionally PET-CT imaging is recommended for further evaluation. Electronically Signed   By: Inez Catalina M.D.   On: 12/15/2019 15:13   CT ANGIO CHEST PE W OR WO CONTRAST  Result Date: 12/19/2019 CLINICAL DATA:  Lymphoma.  Elevated D-dimer.  Dyspnea on exertion. EXAM: CT ANGIOGRAPHY CHEST WITH CONTRAST TECHNIQUE: Multidetector CT imaging of the chest was performed using the standard protocol during bolus administration of intravenous contrast. Multiplanar CT image reconstructions and MIPs were obtained to evaluate the vascular anatomy. CONTRAST:  73m OMNIPAQUE IOHEXOL 350 MG/ML SOLN COMPARISON:  12/18/2019 chest radiograph. CT of the chest of 12/24/2019. FINDINGS: Cardiovascular: The quality of this exam for evaluation of pulmonary embolism is moderate. Although the bolus is well timed, limitations include mild motion and patient left arm position, not elevated. No pulmonary embolism to the large segmental level. Aortic atherosclerosis. Normal heart size. Dual lead pacer. Pulmonary artery enlargement, suggesting pulmonary arterial hypertension. Lad coronary artery calcification. Mediastinum/Nodes: Small low jugular nodes bilaterally. 1.0 cm right subpectoral node. Bilateral axillary nodes, as on recent  diagnostic CT. Example 1.4 cm left axillary node. High right paratracheal 9 mm node on 25/10. Hilar adenopathy is mild at 1.2 cm. Lungs/Pleura: Development of small bilateral pleural effusions since the prior CT. Minimal motion degradation. Right pleural based lesions, including adjacent the anterior fourth and posterior seventh ribs again identified. Upper Abdomen: Normal imaged portions of the liver, spleen, stomach, pancreas, adrenal glands, kidneys. Musculoskeletal: Right chest wall mass, surrounding the fourth anterior right rib, including at on the order of 5.6 x 2.5 cm. Anterior left second rib adjacent soft tissue/pleural thickening is more equivocal on 38/10. Seventh posterior right rib irregularity with underlying lytic lesion including on 79/10. Mild T5 superior endplate compression deformity is unchanged. Review of the MIP images confirms the above findings. IMPRESSION: 1. No evidence of pulmonary embolism with limitations above. Pulmonary artery enlargement suggests pulmonary arterial hypertension. 2. Since the prior CT of 12/18/2019, development of small bilateral pleural effusions. 3. Thoracic adenopathy and right sided pleural/chest wall lesions as detailed above. 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 4. Aortic Atherosclerosis (ICD10-I70.0) and Emphysema (ICD10-J43.9). Electronically Signed   By: KAbigail MiyamotoM.D.   On: 12/19/2019 17:09   MR BRAIN W WO CONTRAST  Result Date: 12/19/2019 CLINICAL DATA:  Metastatic disease staging EXAM: MRI HEAD WITHOUT AND WITH CONTRAST TECHNIQUE: Multiplanar, multiecho pulse sequences of the brain and surrounding structures were obtained without and with intravenous contrast. CONTRAST:  554mGADAVIST GADOBUTROL 1 MMOL/ML IV SOLN COMPARISON:  None. FINDINGS:  There is significant susceptibility artifact from BB within the ethmoid sinus. There is variable obscuration of brain parenchyma on different sequences. Susceptibility weighted imaging is  nondiagnostic. Below findings are within this limitation. Brain: There is no abnormal diffusion signal. Prominence of the ventricles and sulci reflects generalized parenchymal volume loss. There is a chronic small vessel infarct of the right basal ganglia and adjacent white matter. There is no intracranial mass, mass effect, or edema. There is no hydrocephalus or extra-axial fluid collection. No abnormal enhancement. Vascular: Major vessel flow voids at the skull base are preserved. Skull and upper cervical spine: Decreased T1 marrow signal. Sinuses/Orbits: Paranasal sinuses are aerated. Orbits are unremarkable. Other: Sella is unremarkable. Left mastoid effusion. More patchy mild right mastoid fluid opacification. There is abnormal soft tissue thickening at the posterior nasopharyngeal wall eccentric to the left responsible for asymmetric mastoid opacification. IMPRESSION: Suboptimal evaluation due to significant artifact from ethmoid sinus BB. Abnormal soft tissue thickening of the posterior nasopharyngeal wall eccentric to the left with associated left mastoid effusion. Given findings on prior imaging, may reflect lymphomatous involvement. Abnormal marrow signal, which could reflect hematopoietic marrow or lymphomatous involvement. Electronically Signed   By: Macy Mis M.D.   On: 12/19/2019 13:17   MR THORACIC SPINE W WO CONTRAST  Addendum Date: 12/23/2019   ADDENDUM REPORT: 12/23/2019 17:35 ADDENDUM: Study discussed by telephone with Dr. Kerney Elbe on 12/23/2019 at 17:33 . Electronically Signed   By: Genevie Ann M.D.   On: 12/23/2019 17:35   Result Date: 12/23/2019 CLINICAL DATA:  54 year old male with malignant appearing lymphadenopathy, metastatic disease on recent chest and body CT. Working diagnosis of lymphoma versus metastatic disease unknown primary. Diffuse osseous metastatic disease throughout the lumbar spine and visible pelvis on lumbar MRI yesterday. Weakness. EXAM: MRI THORACIC WITHOUT AND  WITH CONTRAST TECHNIQUE: Multiplanar and multiecho pulse sequences of the thoracic spine were obtained without and with intravenous contrast. CONTRAST:  86m GADAVIST GADOBUTROL 1 MMOL/ML IV SOLN COMPARISON:  Lumbar MRI 12/22/2019. FINDINGS: Limited cervical spine imaging: Diffuse marrow replacement in the cervical spine similar to that elsewhere. No definite malignant cervical spinal stenosis. Thoracic spine segmentation: Appears to be normal, the same numbering system as on the lumbar MRI. Alignment:  Preserved thoracic kyphosis. Vertebrae: Diffuse abnormal marrow signal throughout the thoracic spine and visible ribs. Mild pathologic fracture of T5. Superior endplate compression with minimal posterior retropulsion. However, there is bulky epidural and paraspinal tumor at T5 greater on the left (series 18, image 16) resulting in spinal stenosis with mild cord compression (series 19, image 16). No spinal cord edema is evident. The spinal cord is also effaced by epidural tumor at T4. And there is bilateral T4 and left T5 neural foraminal involvement. Thoracic epidural tumor is present from the T3 to the superior T6 level (series 17, image 11). And bulky left greater than right paraspinal tumor is present throughout the upper thoracic spine and contiguous with similar bulky and nodular bilateral intercostal tumor (series 19, image 10). Furthermore, in the lower thoracic spine there is bulky left paraspinal tumor at the T10 level with involvement of the left T10 neural foramen (series 18, image 30 and series 19, image 31). Cord: Cord compression at T4 and T5 as stated above. No definite cord signal abnormality. Fairly capacious thoracic thecal sac at the unaffected levels. No abnormal intradural enhancement. No dural thickening. Paraspinal and other soft tissues: Abnormal thoracic paraspinal and intercostal soft tissues as stated above. Additionally there is evidence of lymphadenopathy  at the visible thoracic inlet  (series 20 image 2). Small pleural effusions are present greater on the left. Disc levels: No significant superimposed degenerative changes. IMPRESSION: 1. Diffuse metastatic disease throughout the visible spine and thorax. Mild pathologic compression fracture of T5. 2. Bulky thoracic paraspinal and intercostal tumor. 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 Neural foraminal involvement also at the bilateral T4, left T5, and left T10 nerve levels. 3. Small pleural effusions greater on the left. Electronically Signed: By: Genevie Ann M.D. On: 12/23/2019 17:24   MR Lumbar Spine W Wo Contrast  Result Date: 12/22/2019 CLINICAL DATA:  54 year old male with malignant appearing lymphadenopathy, metastatic disease on recent chest and body CT. Working diagnosis of lymphoma versus metastatic disease unknown primary. EXAM: MRI LUMBAR SPINE WITHOUT AND WITH CONTRAST TECHNIQUE: Multiplanar and multiecho pulse sequences of the lumbar spine were obtained without and with intravenous contrast. CONTRAST:  28m GADAVIST GADOBUTROL 1 MMOL/ML IV SOLN COMPARISON:  CT Chest, Abdomen, and Pelvis 12/11/2019 FINDINGS: Segmentation:  Normal on the recent CTs. Alignment:  Preserved lumbar lordosis. Vertebrae: Diffusely abnormal marrow signal throughout the visible spine and pelvis with diffuse heterogeneous abnormal marrow enhancement following contrast. Mild pathologic compression fractures suspected at L4 and L5. No retropulsion. There is bulky extraosseous extension of tumor along both sides of the medial right iliac wing on series 10, image 27. See additional lumbar level details below. Conus medullaris and cauda equina: Conus extends to the T12-L1 level. No lower spinal cord or conus signal abnormality. No abnormal intradural enhancement. No dural thickening. Paraspinal and other soft tissues: Bulky lymphadenopathy throughout the retroperitoneum again noted, with nodal conglomeration is  measuring up to 39 mm axial dimension. Bulky extraosseous extension of tumor from the right iliac wing as stated above. Posterior paraspinal soft tissues appear to remain normal. Partially visible trace free fluid in the pelvis. Disc levels: Mild for age superimposed lumbar spine degeneration. There is multifactorial mild spinal and mild to moderate neural foraminal stenosis at L3-L4. No levels of lower thoracic epidural extension of tumor. In the lumbar spine there is focal extraosseous extension of tumor at the left L4 neural foramen (series 11, image 23) although no definite lumbar epidural space involvement at that level. In the sacral spine there is epidural tumor involvement at the left S1 foramen. IMPRESSION: 1. 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 2. Subtle pathologic compression fractures suspected at L4 and L5. Mild extraosseous tumor extension into the left L4 and left S1 neural foramina. But no other malignant neural impingement identified. 3. Bulky extraosseous tumor extension from the visible right iliac bone (series 10, image 27). 4. Bulky metastatic lymphadenopathy redemonstrated throughout the retroperitoneum. 5. Superimposed mild degenerative spinal and mild to moderate neural foraminal stenosis at L3-L4. Electronically Signed   By: HGenevie AnnM.D.   On: 12/22/2019 15:21   CT ABDOMEN PELVIS W CONTRAST  Result Date: 12/03/2019 CLINICAL DATA:  Follow-up abnormal chest x-ray suggesting chest wall mass EXAM: CT CHEST, ABDOMEN, AND PELVIS WITH CONTRAST TECHNIQUE: Multidetector CT imaging of the chest, abdomen and pelvis was performed following the standard protocol during bolus administration of intravenous contrast. CONTRAST:  1049mOMNIPAQUE IOHEXOL 300 MG/ML  SOLN COMPARISON:  Film from earlier in the same day. FINDINGS: CT CHEST FINDINGS Cardiovascular: Atherosclerotic calcifications of the thoracic aorta are noted. No aneurysmal dilatation  or dissection is seen. No cardiac enlargement is noted. Pacing device is again seen.  The pulmonary artery is enlarged without definitive central filling defect. Mediastinum/Nodes: Thoracic inlet is within normal limits. Scattered small hilar and mediastinal lymph nodes are noted. The esophagus appears within normal limits. Bilateral axillary adenopathy is identified the largest of these on the right measures 12 mm. The largest of these on the left measures 11 mm. Lungs/Pleura: Emphysematous changes are seen. The left lung is clear. The emphysematous changes are more marked on the right. This contributes to the somewhat lucent area seen on recent chest x-ray. No sizable effusion is seen. Enhancing pleural base lesions are seen throughout the right chest adjacent to the anterior aspect of the right fourth rib as well as the posterolateral aspect of the right seventh rib and posterior aspect of the right ninth rib. Given the multiplicity of pleural based lesions, metastatic disease deserves consideration. Musculoskeletal: Pathologic fracture is noted involving the posterolateral aspect of the right seventh rib secondary to the pleural based mass. This corresponds with the finding seen on recent chest x-ray. No other definitive pathologic fracture is seen. The vertebral bodies show no definitive compression deformity. CT ABDOMEN PELVIS FINDINGS Hepatobiliary: No focal liver abnormality is seen. No gallstones, gallbladder wall thickening, or biliary dilatation. Pancreas: Unremarkable. No pancreatic ductal dilatation or surrounding inflammatory changes. Spleen: Normal in size without focal abnormality. Adrenals/Urinary Tract: Adrenal glands are within normal limits. Kidneys demonstrate a normal enhancement pattern bilaterally. No renal calculi or obstructive changes are seen. Kidneys demonstrate a normal excretion of contrast bilaterally on delayed images. The bladder is partially decompressed. Stomach/Bowel: Colon shows  no obstructive or inflammatory changes. The appendix is well visualized and within normal limits. No small bowel or gastric abnormality is noted. Vascular/Lymphatic: Atherosclerotic calcifications of the abdominal aorta are seen without definitive aneurysmal dilatation. Considerable retroperitoneal adenopathy is noted in the Peri aortic and intra-aortocaval region. The largest area is noted adjacent to aortic bifurcation measuring approximately 4.3 x 1.9 cm in greatest dimension. No significant inguinal adenopathy is noted. Bilateral common iliac adenopathy is seen as well. Reproductive: Prostate is unremarkable. Other: Minimal free fluid is noted which may be physiologic in nature. Musculoskeletal: Degenerative changes of lumbar spine are noted. There is a moth-eaten pattern identified within the right iliac bone adjacent to the sacroiliac joint as well as an area of bony destruction in the left half of the sacrum best seen on image number 105 of series 4. Soft tissue nodule is noted in the right buttock best seen on image number 85 of series 4 which measures 17 mm in greatest dimension. This may be related to the underlying diffuse process although may be incidental. IMPRESSION: Constellation of findings as described above consistent with widespread neoplastic involvement. Given the lymphadenopathy in the possibility of lymphoma is felt to be most likely. Node biopsy would be helpful. Additionally PET-CT imaging is recommended for further evaluation. Electronically Signed   By: Inez Catalina M.D.   On: 12/10/2019 15:13   DG CHEST PORT 1 VIEW  Result Date: 12/18/2019 CLINICAL DATA:  Shortness of breath, history hypertension, HIV EXAM: PORTABLE CHEST 1 VIEW COMPARISON:  Portable exam 1403 hours compared to 11/25/2019 Correlation: CT chest 12/23/2019 FINDINGS: LEFT subclavian sequential transvenous pacemaker leads project at RIGHT atrium and RIGHT ventricle. Normal heart size and pulmonary vascularity. BILATERAL  hilar enlargement.  Mild bibasilar atelectasis. Upper lungs clear. No pleural effusion or pneumothorax. Extrapleural density at lateral lower RIGHT chest, by CT corresponding to rib lesions as noted on prior CT. IMPRESSION: Bibasilar atelectasis. BILATERAL hilar and AP  window enlargement, corresponding to enlarged pulmonary arteries on prior CT question pulmonary arterial hypertension. Electronically Signed   By: Lavonia Dana M.D.   On: 12/18/2019 14:31   DG Chest Portable 1 View  Result Date: 11/28/2019 CLINICAL DATA:  54 year old male with history of shortness of breath. EXAM: PORTABLE CHEST 1 VIEW COMPARISON:  Chest x-ray 11/04/2019. FINDINGS: Previously noted right upper lobe nodule is less apparent on today's examination. There is an increasing vague masslike density projecting over the lateral aspect of the right mid hemithorax. Probable expansile lytic lesion in the posterolateral aspect of the right seventh rib where there is a nondisplaced fracture. Left lung is clear. No pleural effusions. No evidence of pulmonary edema. Heart size is normal. Upper mediastinal contours are within normal limits. 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 IMPRESSION: 1. Unusual findings in the right hemithorax concerning for potential malignancy. Specifically, there appears to be an expansile lytic lesion in the posterolateral right seventh rib with probable nondisplaced pathologic fracture and adjacent ill-defined opacity in the right mid hemithorax. These findings should be better evaluated with follow-up contrast enhanced chest CT in the near future to exclude underlying malignancy. Electronically Signed   By: Vinnie Langton M.D.   On: 12/13/2019 12:43   VAS [place] LOWER EXTREMITY VENOUS (DVT)  Result Date: 12/20/2019  Lower Venous DVTStudy Indications: Metastatic neoplasm, elevated d-dimer.  Comparison Study: No prior. Performing  Technologist: Oda Cogan RDMS, RVT  Examination Guidelines: A complete evaluation includes B-mode imaging, spectral Doppler, color Doppler, and power Doppler as needed of all accessible portions of each vessel. Bilateral testing is considered an integral part of a complete examination. Limited examinations for reoccurring indications may be performed as noted. The reflux portion of the exam is performed with the patient in reverse Trendelenburg.  +---------+---------------+---------+-----------+----------+--------------+ RIGHT    CompressibilityPhasicitySpontaneityPropertiesThrombus Aging +---------+---------------+---------+-----------+----------+--------------+ CFV      Full           Yes      Yes                                 +---------+---------------+---------+-----------+----------+--------------+ SFJ      Full                                                        +---------+---------------+---------+-----------+----------+--------------+ FV Prox  Full                                                        +---------+---------------+---------+-----------+----------+--------------+ FV Mid   Full                                                        +---------+---------------+---------+-----------+----------+--------------+ FV DistalFull                                                        +---------+---------------+---------+-----------+----------+--------------+  PFV      Full                                                        +---------+---------------+---------+-----------+----------+--------------+ POP      Full           Yes      Yes                                 +---------+---------------+---------+-----------+----------+--------------+ PTV      Full                                                        +---------+---------------+---------+-----------+----------+--------------+ PERO     Full                                                         +---------+---------------+---------+-----------+----------+--------------+   +---------+---------------+---------+-----------+----------+--------------+ LEFT     CompressibilityPhasicitySpontaneityPropertiesThrombus Aging +---------+---------------+---------+-----------+----------+--------------+ CFV      Full           Yes      Yes                                 +---------+---------------+---------+-----------+----------+--------------+ SFJ      Full                                                        +---------+---------------+---------+-----------+----------+--------------+ FV Prox  Full                                                        +---------+---------------+---------+-----------+----------+--------------+ FV Mid   Full                                                        +---------+---------------+---------+-----------+----------+--------------+ FV DistalFull                                                        +---------+---------------+---------+-----------+----------+--------------+ PFV      Full                                                        +---------+---------------+---------+-----------+----------+--------------+  POP      Full           Yes      Yes                                 +---------+---------------+---------+-----------+----------+--------------+ PTV      Full                                                        +---------+---------------+---------+-----------+----------+--------------+ PERO     Full                                                        +---------+---------------+---------+-----------+----------+--------------+     Summary: BILATERAL: - No evidence of deep vein thrombosis seen in the lower extremities, bilaterally. -No evidence of popliteal cyst, bilaterally.   *See table(s) above for measurements and observations. Electronically signed by Deitra Mayo MD on 12/20/2019 at 2:48:54 PM.    Final     ASSESSMENT AND PLAN: Mike Holland 54 y.o. male with medical history significant for HIV, HTN, and depression who presents for a CVA and was incidentally found to have diffuse lymphadenopathy concerning for lymphoma.  After review the labs, review the imaging, discussion with the patient the findings are most consistent with diffuse lymphadenopathy, concerning for a lymphoproliferative disorder.  This patient would be considered high risk for these findings given his history of HIV and current low CD4 count. He underwent excisional lymph node biopsy on 12/12/2019.  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  They are sending for FISH but Burkitt's lymphoma is in the differential.  He developed significant anemia and thrombocytopenia over the weekend and received 2 units PRBCs. The patient current performance status is currently estimated to be 3-4. The patient has now been transitioned to comfort measures and is therefore not a candidate for chemotherapy.  Comfort measures orders have been entered per palliative care.  #Diffuse Lymphadenopathy, concerning for Lymphoma #HIV with Low CD4+ count --CT scan shows patient has mediastinal, axillary, and retroperitoneal lymphadenopathy, concerning for a lymphoproliferative disorder --patient's HIV + status puts him at high risk for the development of lymphoma. CD4 count was ordered earlier this month. Patient has no evidence of active Hep B or Hep C on prior testing.  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  These results were not discussed with the patient due to his lethargy or family due to to lack of availability.  Discussed with nursing. They are aware that I will be happy to return to discuss with family.   --He has now transitioned to comfort measures and there are no plans for chemotherapy.   LOS: 10 days   Mikey Bussing, DNP, AGPCNP-BC, AOCNP January 06, 2020

## 2020-01-25 NOTE — Progress Notes (Signed)
Two units of PRBC's transfused. No adverse effects noted. Will continue to monitor.

## 2020-01-25 NOTE — Progress Notes (Signed)
  Echocardiogram 2D Echocardiogram has been performed.  Mike Holland 01/01/2020, 12:04 PM

## 2020-01-25 DEATH — deceased

## 2020-09-07 IMAGING — CT CT ANGIO CHEST
2 of 6 series · 18 of 36 positions shown · IV contrast (omnipaque)
Comparison: 12/18/2019 chest radiograph. CT of the chest of
12/16/2019.

CLINICAL DATA: Lymphoma.  Elevated D-dimer.  Dyspnea on exertion.

EXAM:
CT ANGIOGRAPHY CHEST WITH CONTRAST
TECHNIQUE: Multidetector CT imaging of the chest was performed using the
standard protocol during bolus administration of intravenous
contrast. Multiplanar CT image reconstructions and MIPs were
obtained to evaluate the vascular anatomy.
CONTRAST:  75mL OMNIPAQUE IOHEXOL 350 MG/ML SOLN

[Series 12: pe thins · axial · 0.82mm/px · z∈[+1149,+1413]mm · 17 of 419 slices shown]
[im 21/419  lung]
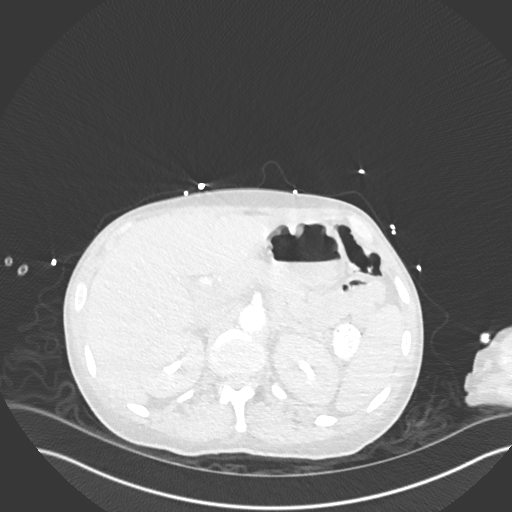
[im 42/419  mediastinal]
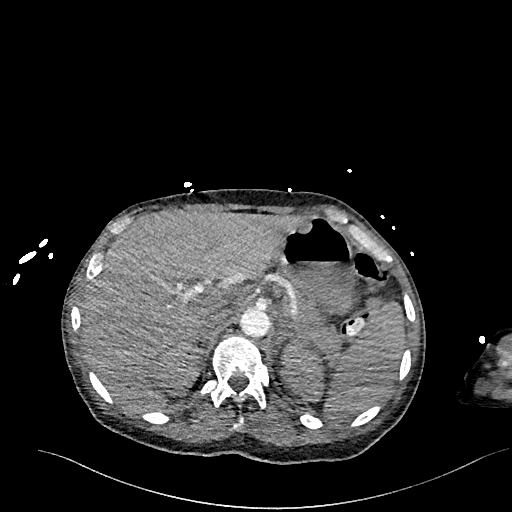
[im 63/419  lung]
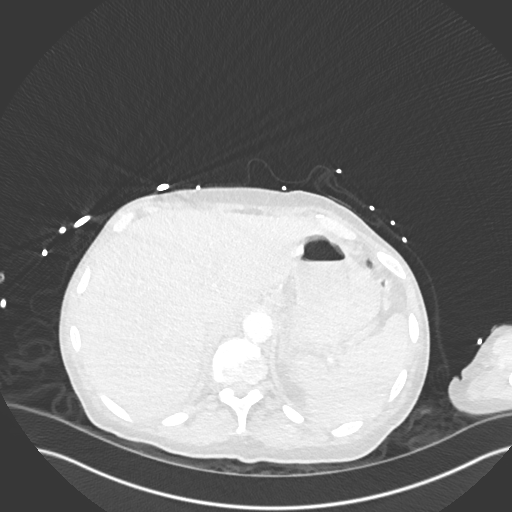
[im 84/419  mediastinal]
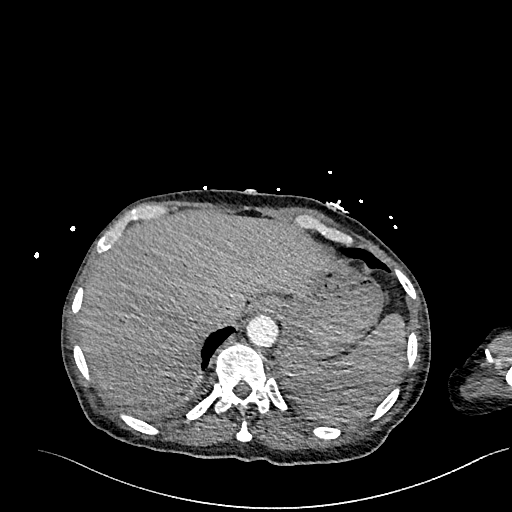
[im 126/419  lung]
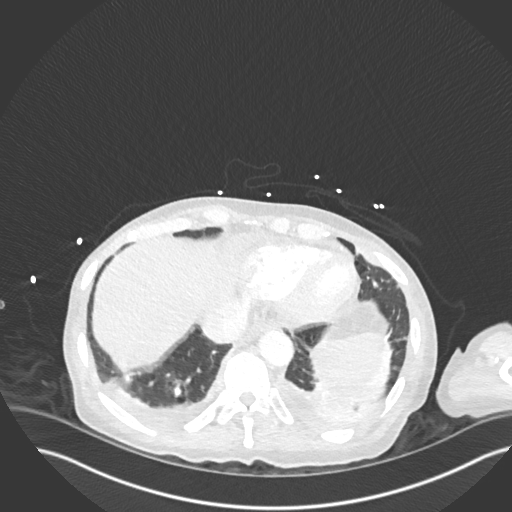
[im 147/419  mediastinal]
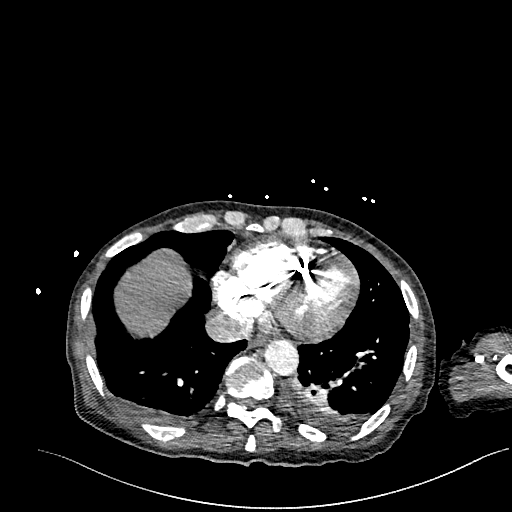
[im 168/419  lung]
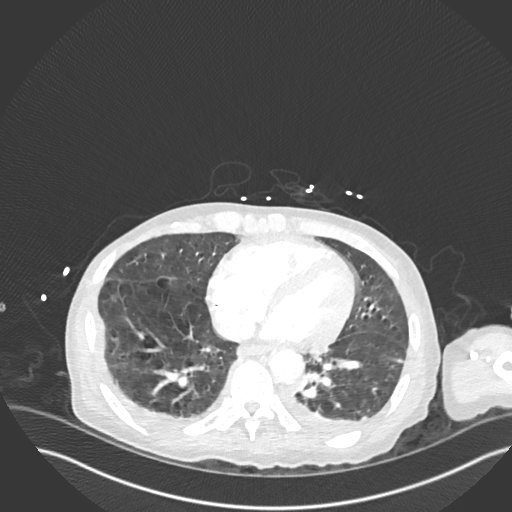
[im 189/419  mediastinal]
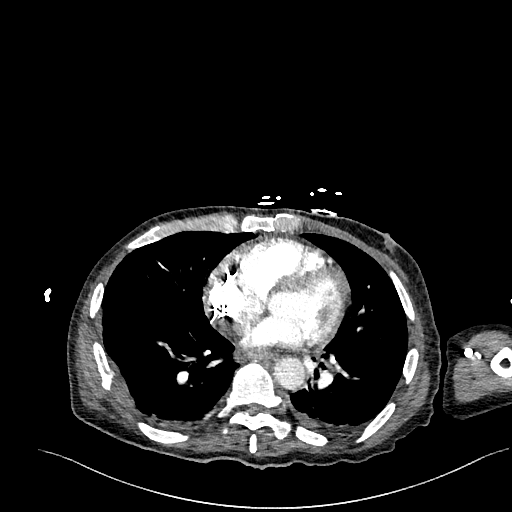
[im 210/419  lung]
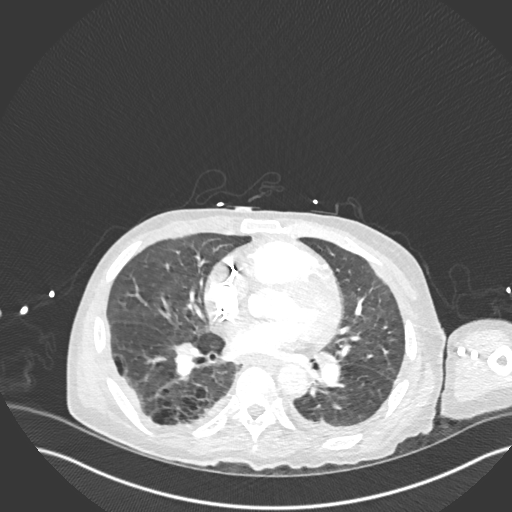
[im 230/419  mediastinal]
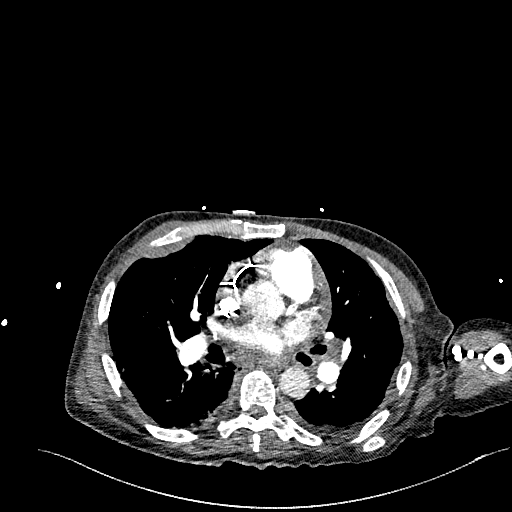
[im 251/419  lung]
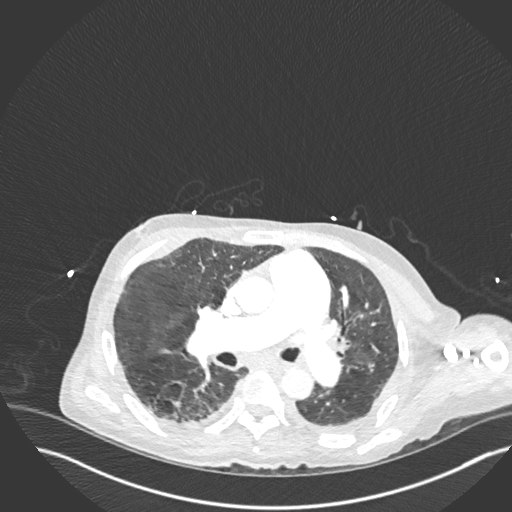
[im 272/419  mediastinal]
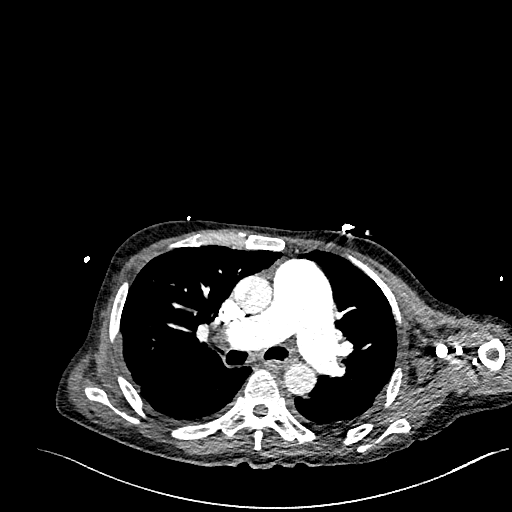
[im 293/419  lung]
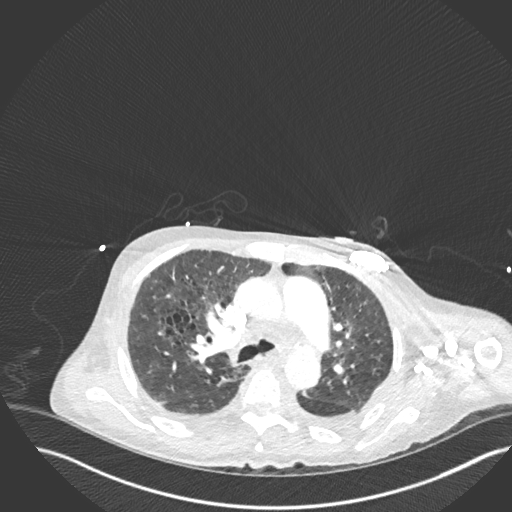
[im 335/419  mediastinal]
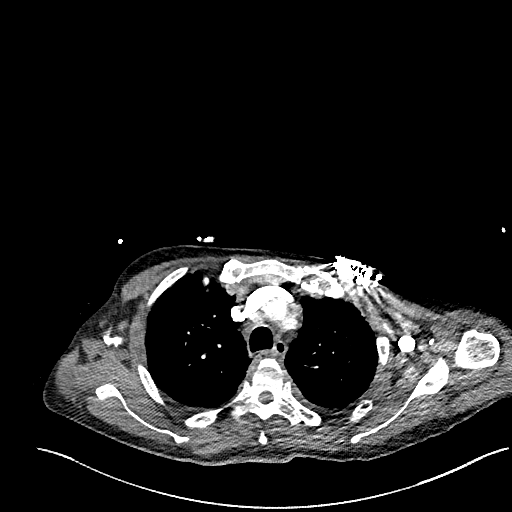
[im 356/419  lung]
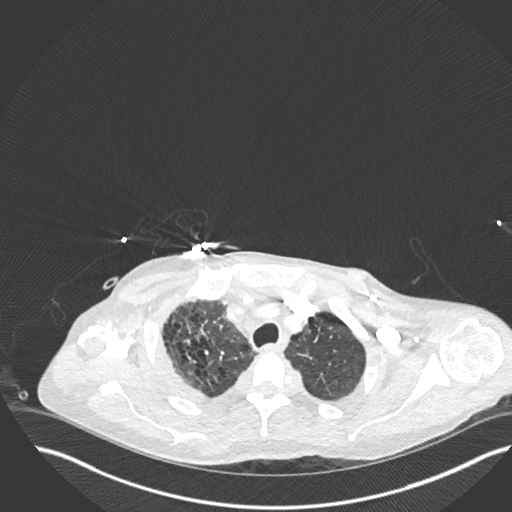
[im 377/419  mediastinal]
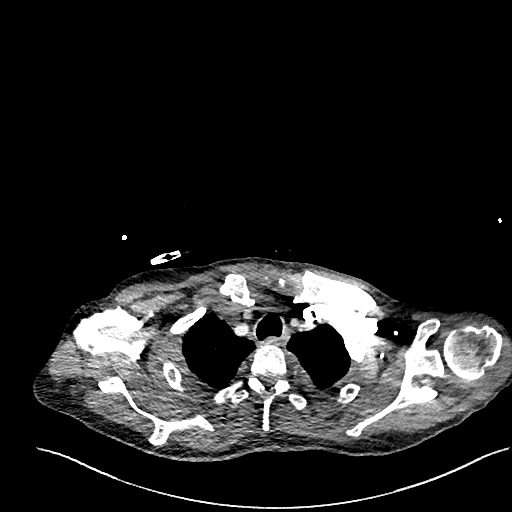
[im 398/419  lung]
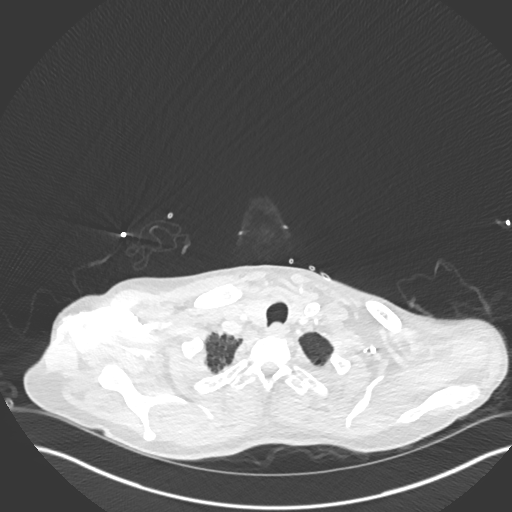

[Series 13: pe 2mm cor · coronal · 0.59mm/px · 1 of 149 slices shown]
[im 75/149  mediastinal]
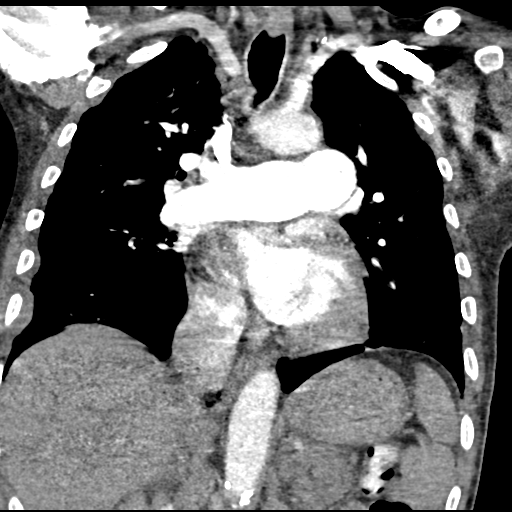

[18 of 36 positions shown; findings below may reference images not displayed]

FINDINGS: Cardiovascular: The quality of this exam for evaluation of pulmonary
embolism is moderate. Although the bolus is well timed, limitations
include mild motion and patient left arm position, not elevated.

No pulmonary embolism to the large segmental level.

Aortic atherosclerosis. Normal heart size. Dual lead pacer.
Pulmonary artery enlargement, suggesting pulmonary arterial
hypertension. Lad coronary artery calcification.

Mediastinum/Nodes: Small low jugular nodes bilaterally. 1.0 cm right
subpectoral node. Bilateral axillary nodes, as on recent diagnostic
CT. Example 1.4 cm left axillary node.

High right paratracheal 9 mm node on [DATE].

Hilar adenopathy is mild at 1.2 cm.

Lungs/Pleura: Development of small bilateral pleural effusions since
the prior CT. Minimal motion degradation. Right pleural based
lesions, including adjacent the anterior fourth and posterior
seventh ribs again identified.

Upper Abdomen: Normal imaged portions of the liver, spleen, stomach,
pancreas, adrenal glands, kidneys.

Musculoskeletal: Right chest wall mass, surrounding the fourth
anterior right rib, including at on the order of 5.6 x 2.5 cm.
Anterior left second rib adjacent soft tissue/pleural thickening is
more equivocal on 38/10.

Seventh posterior right rib irregularity with underlying lytic
lesion including on 79/10.

Mild T5 superior endplate compression deformity is unchanged.

Review of the MIP images confirms the above findings.
IMPRESSION: 1. No evidence of pulmonary embolism with limitations above.
Pulmonary artery enlargement suggests pulmonary arterial
hypertension.
2. Since the prior CT of 12/18/2019, development of small bilateral
pleural effusions.
3. Thoracic adenopathy and right sided pleural/chest wall lesions as
detailed above. Metastatic disease from an unknown primary versus
lymphoma.
4. Aortic Atherosclerosis (QTTZ6-EI8.8) and Emphysema ([id number].Y).

## 2020-11-06 ENCOUNTER — Other Ambulatory Visit: Payer: Self-pay | Admitting: Infectious Diseases
